# Patient Record
Sex: Male | Born: 1945 | Race: Black or African American | Hispanic: No | Marital: Married | Smoking: Never smoker
Health system: Southern US, Community
[De-identification: ages and names within clinical notes are randomized; demographics above are authoritative.]

## PROBLEM LIST (undated history)

## (undated) HISTORY — PX: APPENDECTOMY: SHX54

---

## 2015-04-05 DIAGNOSIS — E785 Hyperlipidemia, unspecified: Secondary | ICD-10-CM | POA: Insufficient documentation

## 2015-04-05 DIAGNOSIS — J309 Allergic rhinitis, unspecified: Secondary | ICD-10-CM

## 2015-04-05 DIAGNOSIS — J4520 Mild intermittent asthma, uncomplicated: Secondary | ICD-10-CM | POA: Insufficient documentation

## 2015-04-05 DIAGNOSIS — I152 Hypertension secondary to endocrine disorders: Secondary | ICD-10-CM

## 2015-04-05 DIAGNOSIS — E1169 Type 2 diabetes mellitus with other specified complication: Secondary | ICD-10-CM

## 2015-04-05 DIAGNOSIS — E118 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: Secondary | ICD-10-CM

## 2015-04-05 HISTORY — DX: Type 2 diabetes mellitus with other specified complication: E11.69

## 2015-04-05 HISTORY — DX: 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: E11.8

## 2015-04-05 HISTORY — DX: Allergic rhinitis, unspecified: J30.9

## 2015-04-05 HISTORY — DX: Type 2 diabetes mellitus with other circulatory complications: I15.2

## 2015-04-05 HISTORY — DX: Mild intermittent asthma, uncomplicated: J45.20

## 2016-12-26 DIAGNOSIS — N528 Other male erectile dysfunction: Secondary | ICD-10-CM

## 2016-12-26 HISTORY — DX: Other male erectile dysfunction: N52.8

## 2017-12-31 DIAGNOSIS — R768 Other specified abnormal immunological findings in serum: Secondary | ICD-10-CM

## 2017-12-31 HISTORY — DX: Other specified abnormal immunological findings in serum: R76.8

## 2019-11-02 DIAGNOSIS — F4321 Adjustment disorder with depressed mood: Secondary | ICD-10-CM

## 2019-11-02 HISTORY — DX: Adjustment disorder with depressed mood: F43.21

## 2020-03-22 DIAGNOSIS — Z0000 Encounter for general adult medical examination without abnormal findings: Secondary | ICD-10-CM | POA: Diagnosis not present

## 2020-03-22 DIAGNOSIS — J4520 Mild intermittent asthma, uncomplicated: Secondary | ICD-10-CM | POA: Diagnosis not present

## 2020-03-22 DIAGNOSIS — E7849 Other hyperlipidemia: Secondary | ICD-10-CM | POA: Diagnosis not present

## 2020-03-22 DIAGNOSIS — Z7689 Persons encountering health services in other specified circumstances: Secondary | ICD-10-CM | POA: Diagnosis not present

## 2020-03-22 DIAGNOSIS — I152 Hypertension secondary to endocrine disorders: Secondary | ICD-10-CM | POA: Diagnosis not present

## 2020-03-22 DIAGNOSIS — Z125 Encounter for screening for malignant neoplasm of prostate: Secondary | ICD-10-CM | POA: Diagnosis not present

## 2020-03-22 DIAGNOSIS — Z1321 Encounter for screening for nutritional disorder: Secondary | ICD-10-CM | POA: Diagnosis not present

## 2020-03-22 DIAGNOSIS — E785 Hyperlipidemia, unspecified: Secondary | ICD-10-CM | POA: Diagnosis not present

## 2020-03-22 DIAGNOSIS — E1169 Type 2 diabetes mellitus with other specified complication: Secondary | ICD-10-CM | POA: Diagnosis not present

## 2020-03-22 DIAGNOSIS — E1159 Type 2 diabetes mellitus with other circulatory complications: Secondary | ICD-10-CM | POA: Diagnosis not present

## 2020-03-22 DIAGNOSIS — D7589 Other specified diseases of blood and blood-forming organs: Secondary | ICD-10-CM | POA: Diagnosis not present

## 2020-03-22 DIAGNOSIS — D696 Thrombocytopenia, unspecified: Secondary | ICD-10-CM | POA: Diagnosis not present

## 2020-03-22 DIAGNOSIS — E119 Type 2 diabetes mellitus without complications: Secondary | ICD-10-CM | POA: Diagnosis not present

## 2020-03-22 DIAGNOSIS — Z5181 Encounter for therapeutic drug level monitoring: Secondary | ICD-10-CM | POA: Diagnosis not present

## 2020-03-26 DIAGNOSIS — E559 Vitamin D deficiency, unspecified: Secondary | ICD-10-CM

## 2020-03-26 DIAGNOSIS — E538 Deficiency of other specified B group vitamins: Secondary | ICD-10-CM

## 2020-03-26 DIAGNOSIS — R972 Elevated prostate specific antigen [PSA]: Secondary | ICD-10-CM | POA: Insufficient documentation

## 2020-03-26 DIAGNOSIS — D696 Thrombocytopenia, unspecified: Secondary | ICD-10-CM

## 2020-03-26 HISTORY — DX: Vitamin D deficiency, unspecified: E55.9

## 2020-03-26 HISTORY — DX: Thrombocytopenia, unspecified: D69.6

## 2020-03-26 HISTORY — DX: Deficiency of other specified B group vitamins: E53.8

## 2020-03-26 HISTORY — DX: Elevated prostate specific antigen (PSA): R97.20

## 2020-07-13 ENCOUNTER — Other Ambulatory Visit: Payer: Self-pay | Admitting: Urology

## 2020-07-13 ENCOUNTER — Other Ambulatory Visit (HOSPITAL_COMMUNITY): Payer: Self-pay | Admitting: Urology

## 2020-07-13 DIAGNOSIS — R972 Elevated prostate specific antigen [PSA]: Secondary | ICD-10-CM

## 2020-07-26 ENCOUNTER — Encounter (HOSPITAL_COMMUNITY)
Admission: RE | Admit: 2020-07-26 | Discharge: 2020-07-26 | Disposition: A | Discharge status: Home / Self Care | Payer: Medicare Other | Source: Ambulatory Visit | Attending: Urology | Admitting: Urology

## 2020-07-26 ENCOUNTER — Other Ambulatory Visit: Payer: Self-pay

## 2020-07-26 DIAGNOSIS — R972 Elevated prostate specific antigen [PSA]: Secondary | ICD-10-CM | POA: Insufficient documentation

## 2020-07-26 MED ORDER — TECHNETIUM TC 99M MEDRONATE IV KIT
22.0000 | PACK | Freq: Once | INTRAVENOUS | Status: AC | PRN
  Administered 2020-07-26: 22 via INTRAVENOUS

## 2020-07-27 ENCOUNTER — Encounter (HOSPITAL_COMMUNITY): Payer: Self-pay

## 2020-07-27 ENCOUNTER — Other Ambulatory Visit (HOSPITAL_COMMUNITY): Payer: Self-pay

## 2020-07-27 DIAGNOSIS — C61 Malignant neoplasm of prostate: Secondary | ICD-10-CM | POA: Insufficient documentation

## 2020-07-27 HISTORY — DX: Malignant neoplasm of prostate: C61

## 2020-08-11 ENCOUNTER — Telehealth: Payer: Self-pay | Admitting: Oncology

## 2020-08-11 NOTE — Telephone Encounter (Signed)
Scheduled appt per 6/10 referral. Called pt, no answer. Left msg with appt date and time.

## 2020-08-16 ENCOUNTER — Telehealth: Payer: Self-pay | Admitting: Oncology

## 2020-08-16 ENCOUNTER — Telehealth: Payer: Self-pay | Admitting: *Deleted

## 2020-08-16 ENCOUNTER — Inpatient Hospital Stay: Payer: Medicare Other | Attending: Oncology | Admitting: Oncology

## 2020-08-16 DIAGNOSIS — C7951 Secondary malignant neoplasm of bone: Secondary | ICD-10-CM | POA: Insufficient documentation

## 2020-08-16 DIAGNOSIS — Z79899 Other long term (current) drug therapy: Secondary | ICD-10-CM | POA: Insufficient documentation

## 2020-08-16 DIAGNOSIS — C61 Malignant neoplasm of prostate: Secondary | ICD-10-CM | POA: Insufficient documentation

## 2020-08-16 NOTE — Telephone Encounter (Signed)
Pt missed appt today. I called, no answer. Left msg for pt to call back to r/s.

## 2020-08-16 NOTE — Telephone Encounter (Signed)
[name] did not come to his 2pm appt with Dr. Alen Blew on 6/15. He did not call Dr. Hazeline Junker desk. Attempted to contact him -LVM asking him to contact CC to r/s. Messaged New patient scheduler with information that patient did not come to scheduled appt. She attempted to contact him, LVM to contact her to r/s.

## 2020-08-22 ENCOUNTER — Encounter: Payer: Self-pay | Admitting: *Deleted

## 2020-08-22 NOTE — Progress Notes (Signed)
Received this new patient referral from [facility]. They have had difficulty reaching patient. He was scheduled for a New Patient appointment with Dr Alen Blew, but was a no-show.   Made an attempt to contact patient which was unsuccessful. Voicemail left requesting call back.   Called twice more with no contact.  Called Judy Gentle at [facility] to notify her of issues with making contact with patient. She will attempt to make contact with patient.   Oncology Nurse Navigator Documentation  Oncology Nurse Navigator Flowsheets 08/22/2020  Diagnosis Status Confirmed Diagnosis Complete  Referral Date to RadOnc/MedOnc 08/22/2020  Navigator Encounter Type Introductory Phone Call  Patient Visit Type MedOnc  Treatment Phase Active Tx  Barriers/Navigation Needs Coordination of Care;Education  Interventions Coordination of Care  Time Spent with Patient 14

## 2020-08-23 ENCOUNTER — Encounter: Payer: Self-pay | Admitting: *Deleted

## 2020-08-23 NOTE — Progress Notes (Signed)
Patient was contacted by [facility] and received our message. He called this office and states that his phone doesn't notify him of calls made from numbers outside his contacts to limit spam calls. He states he will add our number.   Reached out to Dayton Scrape to introduce myself as the office RN Navigator and explain our new patient process. Reviewed the reason for their referral and scheduled their new patient appointment along with labs. Provided address and directions to the office including call back phone number. Reviewed with patient any concerns they may have or any possible barriers to attending their appointment.   Informed patient about my role as a navigator and that I will meet with them prior to their New Patient appointment and more fully discuss what services I can provide. At this time patient has no further questions or needs.    Oncology Nurse Navigator Documentation  Oncology Nurse Navigator Flowsheets 08/23/2020  Diagnosis Status -  Navigator Follow Up Date: 08/30/2020  Navigator Follow Up Reason: New Patient Appointment  Navigator Location [facility]  Referral Date to RadOnc/MedOnc -  Navigator Encounter Type Introductory Phone Call  Patient Visit Type MedOnc  Treatment Phase Active Tx  Barriers/Navigation Needs Coordination of Care;Education  Education Other  Interventions Coordination of Care;Education  Acuity Level 2-Minimal Needs (1-2 Barriers Identified)  Coordination of Care Appts  Education Method Verbal  Support Groups/Services Friends and Family  Time Spent with Patient 24

## 2020-08-30 ENCOUNTER — Encounter: Payer: Self-pay | Admitting: *Deleted

## 2020-08-30 ENCOUNTER — Other Ambulatory Visit: Payer: Self-pay

## 2020-08-30 ENCOUNTER — Other Ambulatory Visit (HOSPITAL_COMMUNITY): Payer: Self-pay

## 2020-08-30 ENCOUNTER — Inpatient Hospital Stay: Payer: Medicare Other

## 2020-08-30 ENCOUNTER — Inpatient Hospital Stay: Payer: Medicare Other | Admitting: Hematology & Oncology

## 2020-08-30 ENCOUNTER — Encounter: Payer: Self-pay | Admitting: Hematology & Oncology

## 2020-08-30 VITALS — BP 142/78 | HR 50 | Temp 98.6°F | Resp 20 | Wt 226.0 lb

## 2020-08-30 DIAGNOSIS — Z7189 Other specified counseling: Secondary | ICD-10-CM | POA: Diagnosis not present

## 2020-08-30 DIAGNOSIS — C61 Malignant neoplasm of prostate: Secondary | ICD-10-CM | POA: Diagnosis not present

## 2020-08-30 DIAGNOSIS — Z79899 Other long term (current) drug therapy: Secondary | ICD-10-CM | POA: Diagnosis not present

## 2020-08-30 DIAGNOSIS — C7800 Secondary malignant neoplasm of unspecified lung: Secondary | ICD-10-CM | POA: Insufficient documentation

## 2020-08-30 DIAGNOSIS — C7951 Secondary malignant neoplasm of bone: Secondary | ICD-10-CM

## 2020-08-30 HISTORY — DX: Other specified counseling: Z71.89

## 2020-08-30 HISTORY — DX: Secondary malignant neoplasm of unspecified lung: C78.00

## 2020-08-30 HISTORY — DX: Malignant neoplasm of prostate: C61

## 2020-08-30 HISTORY — DX: Secondary malignant neoplasm of bone: C79.51

## 2020-08-30 LAB — CBC WITH DIFFERENTIAL (CANCER CENTER ONLY)
Abs Immature Granulocytes: 0.02 10*3/uL (ref 0.00–0.07)
Basophils Absolute: 0 10*3/uL (ref 0.0–0.1)
Basophils Relative: 1 %
Eosinophils Absolute: 0.1 10*3/uL (ref 0.0–0.5)
Eosinophils Relative: 1 %
HCT: 44.4 % (ref 39.0–52.0)
Hemoglobin: 15 g/dL (ref 13.0–17.0)
Immature Granulocytes: 0 %
Lymphocytes Relative: 46 %
Lymphs Abs: 3 10*3/uL (ref 0.7–4.0)
MCH: 33.2 pg (ref 26.0–34.0)
MCHC: 33.8 g/dL (ref 30.0–36.0)
MCV: 98.2 fL (ref 80.0–100.0)
Monocytes Absolute: 0.6 10*3/uL (ref 0.1–1.0)
Monocytes Relative: 9 %
Neutro Abs: 2.8 10*3/uL (ref 1.7–7.7)
Neutrophils Relative %: 43 %
Platelet Count: 163 10*3/uL (ref 150–400)
RBC: 4.52 MIL/uL (ref 4.22–5.81)
RDW: 12.2 % (ref 11.5–15.5)
WBC Count: 6.5 10*3/uL (ref 4.0–10.5)
nRBC: 0 % (ref 0.0–0.2)

## 2020-08-30 LAB — CMP (CANCER CENTER ONLY)
ALT: 22 U/L (ref 0–44)
AST: 20 U/L (ref 15–41)
Albumin: 4.5 g/dL (ref 3.5–5.0)
Alkaline Phosphatase: 172 U/L — ABNORMAL HIGH (ref 38–126)
Anion gap: 10 (ref 5–15)
BUN: 22 mg/dL (ref 8–23)
CO2: 27 mmol/L (ref 22–32)
Calcium: 10.2 mg/dL (ref 8.9–10.3)
Chloride: 102 mmol/L (ref 98–111)
Creatinine: 1.11 mg/dL (ref 0.61–1.24)
GFR, Estimated: >60 mL/min (ref >60)
Glucose, Bld: 124 mg/dL — ABNORMAL HIGH (ref 70–99)
Potassium: 3.9 mmol/L (ref 3.5–5.1)
Sodium: 139 mmol/L (ref 135–145)
Total Bilirubin: 0.7 mg/dL (ref 0.3–1.2)
Total Protein: 8 g/dL (ref 6.5–8.1)

## 2020-08-30 LAB — LACTATE DEHYDROGENASE: LDH: 154 U/L (ref 98–192)

## 2020-08-30 MED ORDER — ABIRATERONE ACETATE 250 MG PO TABS
1000.0000 mg | ORAL_TABLET | Freq: Every day | ORAL | 6 refills | Status: DC
  Filled 2020-08-30: qty 120, 30d supply, fill #0

## 2020-08-30 NOTE — Progress Notes (Signed)
Initial RN Navigator Patient Visit  Name: Randall Carter Date of Referral : 08/22/2020 Diagnosis: Metastatic Prostate Cancer  Met with patient prior to their visit with MD. Hanley Seamen patient "Your Patient Navigator" handout which explains my role, areas in which I am able to help, and all the contact information for myself and the office. Also gave patient MD and Navigator business card. Reviewed with patient the general overview of expected course after initial diagnosis and time frame for all steps to be completed.  New patient packet given to patient which includes: orientation to office and staff; campus directory; education on My Chart and Advance Directives; and patient centered education on prostate cancer.   Patient lives by himself, but has a fiance and supportive children. He is retired.   Patient completed visit with Dr. Marin Olp. Once Dr Marin Olp completes his notes, treatment plan and orders I will follow up to initiate scheduling.   Patient understands all follow up procedures and expectations. They have my number to reach out for any further clarification or additional needs. Will call patient in 5-7 days to see if any further needs have presented, or if patient has any further questions or needs.    Oncology Nurse Navigator Documentation  Oncology Nurse Navigator Flowsheets 08/30/2020  Diagnosis Status -  Navigator Follow Up Date: 09/01/2020  Navigator Follow Up Reason: Appointment Review  Navigator Location [facility]  Referral Date to RadOnc/MedOnc -  Navigator Encounter Type Initial MedOnc  Patient Visit Type MedOnc  Treatment Phase Active Tx  Barriers/Navigation Needs Coordination of Care;Education  Education Newly Diagnosed Cancer Education;Pain/ Symptom Management;Other  Interventions Education;Psycho-Social Support  Acuity Level 2-Minimal Needs (1-2 Barriers Identified)  Coordination of Care -  Education Method Verbal;Written  Support Groups/Services Friends and  Family  Time Spent with Patient 40

## 2020-08-30 NOTE — Progress Notes (Signed)
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  HPI: [name] is a very nice 75 year old African-American male.  He has been in good health.  He is to work for a [employer].  He is now retired.  He does have children and grandchildren.  He actually has a fianc.  He has been followed closely by his family doctor.  However, recently he was found to have a markedly elevated PSA.  I think the PSA was 80.  He was referred to [facility].  He saw Dr. Arnette Schaumann.  She did do a prostate biopsy on him.  This did show that he had Gleason 4+5, 4+4 and 4+3 disease.  He had scans done.  He had a CT scanning done which did show pulmonary nodules.  He had a bone scan which showed lesions in his bones.  He had right iliac crest, L1 vertebral body.  Left T11 vertebral body.  ninth right rib, left fourth, fifth, sixth and seventh rib,  left scapula and right third rib.    He had a PSA of 86.[name]  He was given a dose of Firmagon.  This has caused some hot flashes.  I am sure that his testosterone level is quite low.  According to Dr. Keane Scrape note, she is going start him on Zytiga.  He is not on any oral therapy.  He is not hurting.  He has had no problems with bowels or bladder.  He has had no bleeding.  He has had no diarrhea..  There is been no cough or shortness of breath.  Is no history of prostate cancer in the family.  He says a sister has a breast cancer.  But she had breast cancer when she was a teenager.  I am unsure exactly what kind of breast cancer this was.  He does not smoke.  He does not drink.  Overall, I would say his performance status is ECOG 1.     History reviewed. No pertinent past medical history.:  History reviewed. No pertinent surgical  history.:   Current Outpatient Medications:    Accu-Chek FastClix Lancets MISC, Use one needle daily, Disp: , Rfl:    aspirin 81 MG EC tablet, Take by mouth., Disp: , Rfl:    atenolol (TENORMIN) 50 MG tablet, Take 50 mg by mouth daily., Disp: , Rfl:    Cholecalciferol 125 MCG (5000 UT) TABS, Take by mouth., Disp: , Rfl:    Multiple Vitamin (MULTI-VITAMIN) tablet, Take 1 tablet by mouth daily., Disp: , Rfl:    NIFEdipine (ADALAT CC) 30 MG 24 hr tablet, Take 30 mg by mouth daily., Disp: , Rfl:    simvastatin (ZOCOR) 40 MG tablet, Take 40 mg by mouth at bedtime., Disp: , Rfl:    vitamin B-12 (CYANOCOBALAMIN) 500 MCG tablet, Take by mouth., Disp: , Rfl:    albuterol (VENTOLIN HFA) 108 (90 Base) MCG/ACT inhaler, Inhale 2 puffs into the lungs every 6 (six) hours as needed. (Patient not taking: Reported on 08/30/2020), Disp: , Rfl:    glucose blood test strip, Test glucose one time per day (Patient not taking: Reported on 08/30/2020), Disp: , Rfl: :  :  Not on File:  History reviewed. No pertinent family history.:   Social History  Socioeconomic History   Marital status: Widowed    Spouse name: Not on file   Number of children: Not on file   Years of education: Not on file   Highest education level: Not on file  Occupational History   Not on file  Tobacco Use   Smoking status: Never    Passive exposure: Never   Smokeless tobacco: Never  Vaping Use   Vaping Use: Never used  Substance and Sexual Activity   Alcohol use: Not Currently   Drug use: Not Currently   Sexual activity: Not on file  Other Topics Concern   Not on file  Social History Narrative   Not on file   Social Determinants of Health   Financial Resource Strain: Not on file  Food Insecurity: Not on file  Transportation Needs: Not on file  Physical Activity: Not on file  Stress: Not on file  Social Connections: Not on file  Intimate Partner Violence: Not on file  :  Review of Systems  Constitutional:  Negative.   HENT: Negative.    Eyes: Negative.   Respiratory: Negative.    Cardiovascular: Negative.   Gastrointestinal: Negative.   Genitourinary: Negative.   Musculoskeletal: Negative.   Skin: Negative.   Neurological: Negative.   Endo/Heme/Allergies: Negative.   Psychiatric/Behavioral: Negative.      Exam:  This is a well-developed and well-nourished African-American male in no obvious distress.  Vital signs show temperature of 98.6.  Pulse 50.  Blood pressure 142/78.  Weight is 226 pounds.  Head and neck exam shows no ocular or oral lesions.  He has no scleral icterus.  He has no adenopathy in the neck.  Thyroid is not palpable.  Lungs are clear bilaterally.  Cardiac exam regular rate and rhythm with no murmurs, rubs or bruits.  Abdomen is soft.  He has good bowel sounds.  There is no fluid wave.  There is no guarding or rebound tenderness.  He has no palpable liver or spleen tip.  Back exam shows no tenderness over the spine, ribs or hips.  Extremities shows no clubbing, cyanosis or edema.  He has good range of motion of his joints.  He has good strength in his upper and lower extremities.  Neurological exam shows no focal neurological deficits.  Skin exam shows no rashes, ecchymoses or petechia. '@IPVITALS' @    Recent Labs    08/30/20 1019  WBC 6.5  HGB 15.0  HCT 44.4  PLT 163    Recent Labs    08/30/20 1019  NA 139  K 3.9  CL 102  CO2 27  GLUCOSE 124*  BUN 22  CREATININE 1.11  CALCIUM 10.2    Blood smear review: None  Pathology: See above    Assessment and Plan: Randall Carter is a very nice 75 year old African-American male.  He has metastatic prostate cancer.  He is castrate sensitive right now.  We will have to see if we can get the tumor specimens and send off for molecular markers, particularly, MSI and MMR.  Also need to send off for homologous recombination deficiency.  We will see about also doing a more specific scan for his prostate cancer.  I prefer the  new PSMA scan.  This can certainly give [place] a good idea as to the extent of his disease.  I really think that we can just get away with antiandrogen therapy on him.  I do not think we really have to start him on chemotherapy along with antiandrogen therapy.  I really do not think that he has a high volume of disease.  I will go ahead and add the Zytiga.  I think this would make sense.  I had a long talk with Mr. Leighton.  He is very nice.  He was nice to talk to.  He is very interesting.  I told him that this is a disease that is certainly treatable.  Is not curable.  However, we should be able to get a long-lasting response with antiandrogen therapy.  Answered all of his questions.  He lives in [place].  We will have to see if we cannot do our scans at [facility].  This would be easier for him.  I forgot to mention that he will need a bone hardener.  We will see about Delton See for him.  We will see about doing his antiandrogen therapy in the office.  Again he only lives about 5 minutes from our office.  He would prefer to have all of his treatments here.  I will plan to see him back in another month or so.  I would like to think that his PSA should come down quite nicely.

## 2020-08-30 NOTE — Progress Notes (Signed)
START ON PATHWAY REGIMEN - Prostate     A cycle is every 28 days:     Abiraterone acetate (conventional)      Prednisone   **Always confirm dose/schedule in your pharmacy ordering system**  Patient Characteristics: Adenocarcinoma, Recurrent/New Systemic Disease, Non-Castrate, M1, Low Volume Disease Histology: Adenocarcinoma Therapeutic Status: Recurrent/New Systemic Disease Intent of Therapy: Non-Curative / Palliative Intent, Discussed with Patient

## 2020-08-31 ENCOUNTER — Telehealth: Payer: Self-pay | Admitting: Pharmacy Technician

## 2020-08-31 ENCOUNTER — Encounter: Payer: Self-pay | Admitting: *Deleted

## 2020-08-31 ENCOUNTER — Telehealth: Payer: Self-pay

## 2020-08-31 ENCOUNTER — Encounter: Payer: Self-pay | Admitting: Hematology & Oncology

## 2020-08-31 ENCOUNTER — Other Ambulatory Visit (HOSPITAL_COMMUNITY): Payer: Self-pay

## 2020-08-31 ENCOUNTER — Telehealth: Payer: Self-pay | Admitting: Pharmacist

## 2020-08-31 ENCOUNTER — Telehealth: Payer: Self-pay | Admitting: *Deleted

## 2020-08-31 DIAGNOSIS — C61 Malignant neoplasm of prostate: Secondary | ICD-10-CM

## 2020-08-31 DIAGNOSIS — C7951 Secondary malignant neoplasm of bone: Secondary | ICD-10-CM

## 2020-08-31 LAB — PSA, TOTAL AND FREE
PSA, Free Pct: 8 %
PSA, Free: 1.14 ng/mL
Prostate Specific Ag, Serum: 14.3 ng/mL — ABNORMAL HIGH (ref 0.0–4.0)

## 2020-08-31 LAB — TESTOSTERONE
Testosterone: 22 ng/dL — ABNORMAL LOW (ref 264–916)
Testosterone: 24 ng/dL — ABNORMAL LOW (ref 264–916)

## 2020-08-31 NOTE — Telephone Encounter (Signed)
-----   Message from [name], MD sent at 08/31/2020  6:51 AM EDT ----- Call - the PSA is down to 14!!!!  Laurey Arrow

## 2020-08-31 NOTE — Telephone Encounter (Signed)
Oral Oncology Pharmacist Encounter  Received new prescription for Zytiga (abiraterone) for the treatment of metastatic castrate sensitive prostate cancer in conjunction with prednisone and ADT, planned duration until disease progression or unacceptable drug toxicity.  CMP from 08/30/20 assessed, no relevant lab abnormalities. Prescription dose and frequency assessed.   Current medication list in Epic reviewed, no DDIs with abiraterone identified.  Evaluated chart and no patient barriers to medication adherence identified.   Prescription has been e-scribed to the [facility] for benefits analysis and approval.  Oral Oncology Clinic will continue to follow for insurance authorization, copayment issues, initial counseling and start date.   Darl Pikes, PharmD, BCPS, BCOP, CPP Hematology/Oncology Clinical Pharmacist Practitioner [facility] [phone]  08/31/2020 3:20 PM

## 2020-08-31 NOTE — Progress Notes (Signed)
Oncomap testing request sent on specimen [id number] DOS 07/18/2020 per Dr Antonieta Pert request.  [facility] and requested homologous recombination deficiency testing be added to specimen [id number] DOS 07/18/2020 per Dr Antonieta Pert request. Damaris Schooner to [place] at [facility]. She will work with [employer] to have the additional testing request placed.   Oncology Nurse Navigator Documentation  Oncology Nurse Navigator Flowsheets 08/31/2020  Diagnosis Status -  Navigator Follow Up Date: 09/01/2020  Navigator Follow Up Reason: Appointment Review  Navigator Location [facility]  Referral Date to RadOnc/MedOnc -  Navigator Encounter Type Molecular Studies  Patient Visit Type MedOnc  Treatment Phase Active Tx  Barriers/Navigation Needs Coordination of Care;Education  Education -  Interventions Coordination of Care  Acuity Level 2-Minimal Needs (1-2 Barriers Identified)  Coordination of Care -  Education Method -  Support Groups/Services Friends and Family  Time Spent with Patient 8

## 2020-08-31 NOTE — Telephone Encounter (Signed)
As noted below by Dr. Marin Olp, I informed the patient that the PSA is down to 14.

## 2020-08-31 NOTE — Telephone Encounter (Signed)
Oral Oncology Patient Advocate Encounter  Prior Authorization for Fabio Asa has been approved.    PA# [id number] Effective dates: 08/31/20 through 03/03/21  Patients co-pay is $195.00 ($95 deductible & $100 copay)  Oral Oncology Clinic will continue to follow.   [place] Patient [place] Phone [phone] Fax [phone] 08/31/2020 12:06 PM

## 2020-08-31 NOTE — Telephone Encounter (Signed)
Oral Oncology Patient Advocate Encounter   Received notification from [employer] that prior authorization for Randall Carter is required.   PA submitted on CoverMyMeds Key [id number]  Status is pending   Oral Oncology Clinic will continue to follow.  [place] Patient [place] Phone [phone] Fax [phone] 08/31/2020 12:03 PM

## 2020-09-01 ENCOUNTER — Encounter: Payer: Self-pay | Admitting: *Deleted

## 2020-09-01 NOTE — Progress Notes (Signed)
Patient needs PET scan which is currently pending approval. Will schedule once PA obtained.  Patient is to start [place]. [facility] working on authorization/copay/dispensing.   Called and spoke to patient to update him to current progress. He had some questions about the Zytiga which I answered to his satisfaction. He also mentions that "someone named Bethena Roys has called me". I explained to him that she was part of the oral pharmacy team and he needed to call her back. He understood.   Received confirmation from [facility] that requested testing will be run on specimen.   Oncology Nurse Navigator Documentation  Oncology Nurse Navigator Flowsheets 09/01/2020  Diagnosis Status -  Navigator Follow Up Date: 09/06/2020  Navigator Follow Up Reason: Appointment Review  Navigator Location [facility]  Referral Date to RadOnc/MedOnc -  Navigator Encounter Type Telephone  Telephone Patient Update;Outgoing Call  Patient Visit Type MedOnc  Treatment Phase Active Tx  Barriers/Navigation Needs Coordination of Care;Education  Education Transport During Treatment;Other  Interventions Education;Psycho-Social Support  Acuity Level 2-Minimal Needs (1-2 Barriers Identified)  Coordination of Care -  Education Method Verbal  Support Groups/Services Friends and Family  Time Spent with Patient 35

## 2020-09-05 ENCOUNTER — Telehealth: Payer: Self-pay | Admitting: Pharmacy Technician

## 2020-09-05 NOTE — Telephone Encounter (Signed)
Oral Oncology Patient Advocate Encounter  Patient stopped by the [facility] this morning to complete application for Wynetta Emery and [place] in an effort to reduce patient's out of pocket expense for Zytiga to $0.    Application completed and faxed to [phone].   [employer] patient assistance phone number for follow up is [phone].   This encounter will be updated until final determination.  [place] Patient [place] Phone [phone] Fax [phone] 09/05/2020 3:01 PM

## 2020-09-07 MED ORDER — PREDNISONE 5 MG PO TABS
5.0000 mg | ORAL_TABLET | Freq: Every day | ORAL | 6 refills | Status: DC
Start: 2020-09-07 — End: 2021-03-28

## 2020-09-07 NOTE — Telephone Encounter (Signed)
Oral Chemotherapy Pharmacist Encounter  Patient Education I spoke with patient for overview of new oral chemotherapy medication: Zytiga (abiraterone) for the treatment of metastatic castrate sensitive prostate cancer in conjunction with prednisone and ADT, planned duration until disease progression or unacceptable drug toxicity.   Counseled patient on administration, dosing, side effects, monitoring, drug-food interactions, safe handling, storage, and disposal. Patient will take: -Zytiga (abiraterone): Take 4 tablets (1,000 mg total) by mouth daily. Take on an empty stomach 1 hour before or 2 hours after a meal -Prednisone: Take 1 tablet (5 mg total) by mouth daily with breakfast.  Side effects include but not limited to: HTN, edema, fatigue.    Reviewed with patient importance of keeping a medication schedule and plan for any missed doses.  After discussion with patient no patient barriers to medication adherence identified.   Mr. Bolin voiced understanding and appreciation. All questions answered. Medication handout provided.  Provided patient with [facility] phone number. Patient knows to call the office with questions or concerns. Oral Chemotherapy Navigation Clinic will continue to follow.  Darl Pikes, PharmD, BCPS, BCOP, CPP Hematology/Oncology Clinical Pharmacist Practitioner [facility] [place] [facility] [phone]   09/07/2020 2:46 PM

## 2020-09-07 NOTE — Telephone Encounter (Signed)
Oral Oncology Patient Advocate Encounter  Received notification from [place] and [place] ([employer]) that patient has been temporarily enrolled into their program to receive Zytiga from the manufacturer at $0 out of pocket until 11/06/20.  Patient will have assistance extended to 03/03/21 pending Medicare D attestation form being signed.    I called and spoke with patient.  He knows we will have to re-apply.   Specialty Pharmacy that will dispense medication is [facility].  Patient knows to call the office with questions or concerns.   Oral Oncology Clinic will continue to follow.  [place] Patient [place] Phone [phone] Fax [phone] 09/07/2020 11:39 AM

## 2020-09-08 ENCOUNTER — Encounter: Payer: Self-pay | Admitting: *Deleted

## 2020-09-08 NOTE — Progress Notes (Signed)
Called to notify patient that we are still waiting on insurance approval to schedule his PET scan. Request was submitted Wednesday and status is currently pending.   Patient understands the delay in getting the PET scheduled. He knows we will reach out to schedule once authorization is obtained.   He has no other needs at this time.   Oncology Nurse Navigator Documentation  Oncology Nurse Navigator Flowsheets 09/08/2020  Diagnosis Status -  Navigator Follow Up Date: 09/11/2020  Navigator Follow Up Reason: Follow-up Appointment  Navigator Location [facility]  Referral Date to RadOnc/MedOnc -  Navigator Encounter Type Telephone  Telephone Outgoing Call  Patient Visit Type MedOnc  Treatment Phase Active Tx  Barriers/Navigation Needs Coordination of Care;Education  Education Other  Interventions Education;Psycho-Social Support  Acuity Level 2-Minimal Needs (1-2 Barriers Identified)  Coordination of Care -  Education Method Verbal  Support Groups/Services Friends and Family  Time Spent with Patient 15

## 2020-09-11 ENCOUNTER — Inpatient Hospital Stay: Payer: Medicare Other | Admitting: Hematology & Oncology

## 2020-09-11 ENCOUNTER — Other Ambulatory Visit: Payer: Self-pay

## 2020-09-11 ENCOUNTER — Encounter: Payer: Self-pay | Admitting: Hematology & Oncology

## 2020-09-11 ENCOUNTER — Inpatient Hospital Stay: Payer: Medicare Other | Attending: Oncology

## 2020-09-11 ENCOUNTER — Inpatient Hospital Stay: Payer: Medicare Other

## 2020-09-11 VITALS — BP 118/64 | HR 54 | Temp 98.6°F | Resp 20 | Wt 227.0 lb

## 2020-09-11 DIAGNOSIS — C61 Malignant neoplasm of prostate: Secondary | ICD-10-CM

## 2020-09-11 DIAGNOSIS — Z79899 Other long term (current) drug therapy: Secondary | ICD-10-CM | POA: Insufficient documentation

## 2020-09-11 DIAGNOSIS — C7951 Secondary malignant neoplasm of bone: Secondary | ICD-10-CM | POA: Diagnosis not present

## 2020-09-11 DIAGNOSIS — C7800 Secondary malignant neoplasm of unspecified lung: Secondary | ICD-10-CM

## 2020-09-11 LAB — CMP (CANCER CENTER ONLY)
ALT: 18 U/L (ref 0–44)
AST: 19 U/L (ref 15–41)
Albumin: 4.3 g/dL (ref 3.5–5.0)
Alkaline Phosphatase: 132 U/L — ABNORMAL HIGH (ref 38–126)
Anion gap: 10 (ref 5–15)
BUN: 18 mg/dL (ref 8–23)
CO2: 26 mmol/L (ref 22–32)
Calcium: 10.5 mg/dL — ABNORMAL HIGH (ref 8.9–10.3)
Chloride: 105 mmol/L (ref 98–111)
Creatinine: 1.27 mg/dL — ABNORMAL HIGH (ref 0.61–1.24)
GFR, Estimated: 60 mL/min — ABNORMAL LOW (ref >60)
Glucose, Bld: 124 mg/dL — ABNORMAL HIGH (ref 70–99)
Potassium: 4.1 mmol/L (ref 3.5–5.1)
Sodium: 141 mmol/L (ref 135–145)
Total Bilirubin: 0.6 mg/dL (ref 0.3–1.2)
Total Protein: 7.5 g/dL (ref 6.5–8.1)

## 2020-09-11 LAB — CBC WITH DIFFERENTIAL (CANCER CENTER ONLY)
Abs Immature Granulocytes: 0.01 10*3/uL (ref 0.00–0.07)
Basophils Absolute: 0 10*3/uL (ref 0.0–0.1)
Basophils Relative: 0 %
Eosinophils Absolute: 0.1 10*3/uL (ref 0.0–0.5)
Eosinophils Relative: 1 %
HCT: 42.3 % (ref 39.0–52.0)
Hemoglobin: 14.3 g/dL (ref 13.0–17.0)
Immature Granulocytes: 0 %
Lymphocytes Relative: 50 %
Lymphs Abs: 3.5 10*3/uL (ref 0.7–4.0)
MCH: 32.9 pg (ref 26.0–34.0)
MCHC: 33.8 g/dL (ref 30.0–36.0)
MCV: 97.5 fL (ref 80.0–100.0)
Monocytes Absolute: 0.8 10*3/uL (ref 0.1–1.0)
Monocytes Relative: 10 %
Neutro Abs: 2.8 10*3/uL (ref 1.7–7.7)
Neutrophils Relative %: 39 %
Platelet Count: DECREASED 10*3/uL (ref 150–400)
RBC: 4.34 MIL/uL (ref 4.22–5.81)
RDW: 12 % (ref 11.5–15.5)
WBC Count: 7.2 10*3/uL (ref 4.0–10.5)
nRBC: 0 % (ref 0.0–0.2)

## 2020-09-11 MED ORDER — DENOSUMAB 120 MG/1.7ML ~~LOC~~ SOLN
SUBCUTANEOUS | Status: AC
  Filled 2020-09-11: qty 1.7

## 2020-09-11 MED ORDER — LEUPROLIDE ACETATE 7.5 MG ~~LOC~~ KIT
7.5000 mg | PACK | Freq: Once | SUBCUTANEOUS | Status: AC
  Administered 2020-09-11: 7.5 mg via SUBCUTANEOUS
  Filled 2020-09-11: qty 7.5

## 2020-09-11 MED ORDER — DENOSUMAB 120 MG/1.7ML ~~LOC~~ SOLN
120.0000 mg | Freq: Once | SUBCUTANEOUS | Status: AC
  Administered 2020-09-11: 120 mg via SUBCUTANEOUS

## 2020-09-11 NOTE — Patient Instructions (Signed)
Denosumab injection What is this medication? DENOSUMAB (den oh sue mab) slows bone breakdown. Prolia is used to treat osteoporosis in women after menopause and in men, and in people who are taking corticosteroids for 6 months or more. Delton See is used to treat a high calcium level due to cancer and to prevent bone fractures and other bone problems caused by multiple myeloma or cancer bone metastases. Delton See is also used totreat giant cell tumor of the bone. This medicine may be used for other purposes; ask your health care provider orpharmacist if you have questions. COMMON BRAND NAME(S): Prolia, XGEVA What should I tell my care team before I take this medication? They need to know if you have any of these conditions: dental disease having surgery or tooth extraction infection kidney disease low levels of calcium or Vitamin D in the blood malnutrition on hemodialysis skin conditions or sensitivity thyroid or parathyroid disease an unusual reaction to denosumab, other medicines, foods, dyes, or preservatives pregnant or trying to get pregnant breast-feeding How should I use this medication? This medicine is for injection under the skin. It is given by a health careprofessional in a hospital or clinic setting. A special MedGuide will be given to you before each treatment. Be sure to readthis information carefully each time. For Prolia, talk to your pediatrician regarding the use of this medicine in children. Special care may be needed. For Delton See, talk to your pediatrician regarding the use of this medicine in children. While this drug may be prescribed for children as young as 13 years for selected conditions,precautions do apply. Overdosage: If you think you have taken too much of this medicine contact apoison control center or emergency room at once. NOTE: This medicine is only for you. Do not share this medicine with others. What if I miss a dose? It is important not to miss your dose. Call  your doctor or health careprofessional if you are unable to keep an appointment. What may interact with this medication? Do not take this medicine with any of the following medications: other medicines containing denosumab This medicine may also interact with the following medications: medicines that lower your chance of fighting infection steroid medicines like prednisone or cortisone This list may not describe all possible interactions. Give your health care provider a list of all the medicines, herbs, non-prescription drugs, or dietary supplements you use. Also tell them if you smoke, drink alcohol, or use illegaldrugs. Some items may interact with your medicine. What should I watch for while using this medication? Visit your doctor or health care professional for regular checks on your progress. Your doctor or health care professional may order blood tests andother tests to see how you are doing. Call your doctor or health care professional for advice if you get a fever, chills or sore throat, or other symptoms of a cold or flu. Do not treat yourself. This drug may decrease your body's ability to fight infection. Try toavoid being around people who are sick. You should make sure you get enough calcium and vitamin D while you are taking this medicine, unless your doctor tells you not to. Discuss the foods you eatand the vitamins you take with your health care professional. See your dentist regularly. Brush and floss your teeth as directed. Before youhave any dental work done, tell your dentist you are receiving this medicine. Do not become pregnant while taking this medicine or for 5 months after stopping it. Talk with your doctor or health care professional about your  birth control options while taking this medicine. Women should inform their doctor if they wish to become pregnant or think they might be pregnant. There is a potential for serious side effects to an unborn child. Talk to your health  careprofessional or pharmacist for more information. What side effects may I notice from receiving this medication? Side effects that you should report to your doctor or health care professionalas soon as possible: allergic reactions like skin rash, itching or hives, swelling of the face, lips, or tongue bone pain breathing problems dizziness jaw pain, especially after dental work redness, blistering, peeling of the skin signs and symptoms of infection like fever or chills; cough; sore throat; pain or trouble passing urine signs of low calcium like fast heartbeat, muscle cramps or muscle pain; pain, tingling, numbness in the hands or feet; seizures unusual bleeding or bruising unusually weak or tired Side effects that usually do not require medical attention (report to yourdoctor or health care professional if they continue or are bothersome): constipation diarrhea headache joint pain loss of appetite muscle pain runny nose tiredness upset stomach This list may not describe all possible side effects. Call your doctor for medical advice about side effects. You may report side effects to FDA at1-800-FDA-1088. Where should I keep my medication? This medicine is only given in a clinic, doctor's office, or other health caresetting and will not be stored at home. NOTE: This sheet is a summary. It may not cover all possible information. If you have questions about this medicine, talk to your doctor, pharmacist, orhealth care provider.  2022 Elsevier/Gold Standard (2017-06-27 16:10:44)  Leuprolide depot injection What is this medication? LEUPROLIDE (loo PROE lide) is a man-made protein that acts like a natural hormone in the body. It decreases testosterone in men and decreases estrogen in women. In men, this medicine is used to treat advanced prostate cancer. In women, some forms of this medicine may be used to treat endometriosis, uterinefibroids, or other male hormone-related  problems. This medicine may be used for other purposes; ask your health care provider orpharmacist if you have questions. COMMON BRAND NAME(S): Eligard, Fensolv, Lupron Depot, Lupron Depot-Ped, Viadur What should I tell my care team before I take this medication? They need to know if you have any of these conditions: diabetes heart disease or previous heart attack high blood pressure high cholesterol mental illness osteoporosis pain or difficulty passing urine seizures spinal cord metastasis stroke suicidal thoughts, plans, or attempt; a previous suicide attempt by you or a family member tobacco smoker unusual vaginal bleeding (women) an unusual or allergic reaction to leuprolide, benzyl alcohol, other medicines, foods, dyes, or preservatives pregnant or trying to get pregnant breast-feeding How should I use this medication? This medicine is for injection into a muscle or for injection under the skin. It is given by a health care professional in a hospital or clinic setting. The specific product will determine how it will be given to you. Make sure youunderstand which product you receive and how often you will receive it. Talk to your pediatrician regarding the use of this medicine in children.Special care may be needed. Overdosage: If you think you have taken too much of this medicine contact apoison control center or emergency room at once. NOTE: This medicine is only for you. Do not share this medicine with others. What if I miss a dose? It is important not to miss a dose. Call your doctor or health careprofessional if you are unable to keep an appointment. Depot  injections: Depot injections are given either once-monthly, every 12 weeks, every 16 weeks, or every 24 weeks depending on the product you are prescribed. The product you are prescribed will be based on if you are male orfemale, and your condition. Make sure you understand your product and dosing. What may interact with this  medication? Do not take this medicine with any of the following medications: chasteberry cisapride dronedarone pimozide thioridazine This medicine may also interact with the following medications: herbal or dietary supplements, like black cohosh or DHEA male hormones, like estrogens or progestins and birth control pills, patches, rings, or injections male hormones, like testosterone other medicines that prolong the QT interval (abnormal heart rhythm) This list may not describe all possible interactions. Give your health care provider a list of all the medicines, herbs, non-prescription drugs, or dietary supplements you use. Also tell them if you smoke, drink alcohol, or use illegaldrugs. Some items may interact with your medicine. What should I watch for while using this medication? Visit your doctor or health care professional for regular checks on your progress. During the first weeks of treatment, your symptoms may get worse, but then will improve as you continue your treatment. You may get hot flashes, increased bone pain, increased difficulty passing urine, or an aggravation of nerve symptoms. Discuss these effects with your doctor or health careprofessional, some of them may improve with continued use of this medicine. Male patients may experience a menstrual cycle or spotting during the first months of therapy with this medicine. If this continues, contact your doctor orhealth care professional. This medicine may increase blood sugar. Ask your healthcare provider if changesin diet or medicines are needed if you have diabetes. What side effects may I notice from receiving this medication? Side effects that you should report to your doctor or health care professionalas soon as possible: allergic reactions like skin rash, itching or hives, swelling of the face, lips, or tongue breathing problems chest pain depression or memory disorders pain in your legs or groin pain at site where  injected or implanted seizures severe headache signs and symptoms of high blood sugar such as being more thirsty or hungry or having to urinate more than normal. You may also feel very tired or have blurry vision swelling of the feet and legs suicidal thoughts or other mood changes visual changes vomiting Side effects that usually do not require medical attention (report to yourdoctor or health care professional if they continue or are bothersome): breast swelling or tenderness decrease in sex drive or performance diarrhea hot flashes loss of appetite muscle, joint, or bone pains nausea redness or irritation at site where injected or implanted skin problems or acne This list may not describe all possible side effects. Call your doctor for medical advice about side effects. You may report side effects to FDA at1-800-FDA-1088. Where should I keep my medication? This drug is given in a hospital or clinic and will not be stored at home. NOTE: This sheet is a summary. It may not cover all possible information. If you have questions about this medicine, talk to your doctor, pharmacist, orhealth care provider.  2022 Elsevier/Gold Standard (2019-01-20 10:35:13)

## 2020-09-11 NOTE — Telephone Encounter (Signed)
Medicare D attestation signed today at appointment.  Faxed to [facility] [phone]).  [facility] [place] Phone [phone] Fax [phone] 09/11/2020 10:20 AM

## 2020-09-12 ENCOUNTER — Telehealth: Payer: Self-pay

## 2020-09-12 ENCOUNTER — Encounter: Payer: Self-pay | Admitting: Hematology & Oncology

## 2020-09-12 ENCOUNTER — Encounter: Payer: Self-pay | Admitting: *Deleted

## 2020-09-12 ENCOUNTER — Telehealth: Payer: Self-pay | Admitting: *Deleted

## 2020-09-12 LAB — PSA, TOTAL AND FREE
PSA, Free Pct: 9.9 %
PSA, Free: 0.69 ng/mL
Prostate Specific Ag, Serum: 7 ng/mL — ABNORMAL HIGH (ref 0.0–4.0)

## 2020-09-12 LAB — TESTOSTERONE: Testosterone: 14 ng/dL — ABNORMAL LOW (ref 264–916)

## 2020-09-12 NOTE — Telephone Encounter (Addendum)
-----   Message from [name], MD sent at 09/12/2020  9:44 AM EDT ----- Called patient to let him know the PSA is down to 7!!!  Great job!!

## 2020-09-12 NOTE — Progress Notes (Signed)
Patient's PET scan denied after initial request and peer to peer. At this time Dr Marin Olp doesn't want to pursue other scans.   Called patient and notified him of his insurance decision.   Oncology Nurse Navigator Documentation  Oncology Nurse Navigator Flowsheets 09/12/2020  Diagnosis Status -  Navigator Follow Up Date: 10/09/2020  Navigator Follow Up Reason: Follow-up Appointment  Navigator Location [facility]  Referral Date to RadOnc/MedOnc -  Navigator Encounter Type Appt/Treatment Plan Review;Telephone  Telephone Outgoing Call  Patient Visit Type MedOnc  Treatment Phase Active Tx  Barriers/Navigation Needs Coordination of Care;Education  Education Other  Interventions Education;Psycho-Social Support  Acuity Level 2-Minimal Needs (1-2 Barriers Identified)  Coordination of Care -  Education Method Verbal  Support Groups/Services Friends and Family  Time Spent with Patient 15

## 2020-09-12 NOTE — Telephone Encounter (Signed)
No 09/11/20 LOS noted   Randall Carter

## 2020-09-12 NOTE — Progress Notes (Signed)
Hematology and Oncology Follow Up Visit  Randall Carter [id number] 10-28-1945 75 y.o. 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 daily-start on 09/14/2020 Xgeva 120 mg IM q. 3 months-next dose on 12/2020     Interim History:  [name] is back for his second office visit.  We first saw him back in late June.  At that time, he was diagnosed metastatic prostate cancer.  He was castrate sensitive.  He was placed on Firmagon.  His PSA has come down.  We checked his PSA with his first visit it was down to 14.  We have to get him on Zytiga.  Hopefully he will start this in a couple days.  He will get his Delton See today.  He is feeling okay.  He is having hot flashes.  This is not surprising.  He has had no issues with appetite.  He has had no obvious change in bowel or bladder habits.  He has had no cough.  There is no shortness of breath.  He has had no bleeding.  Currently, I would have to say performance status is ECOG 1.  Medications:  Current Outpatient Medications:    Accu-Chek FastClix Lancets MISC, Use one needle daily, Disp: , Rfl:    aspirin 81 MG EC tablet, Take by mouth., Disp: , Rfl:    atenolol (TENORMIN) 50 MG tablet, Take 50 mg by mouth daily., Disp: , Rfl:    Cholecalciferol 125 MCG (5000 UT) TABS, Take by mouth., Disp: , Rfl:    Multiple Vitamin (MULTI-VITAMIN) tablet, Take 1 tablet by mouth daily., Disp: , Rfl:    NIFEdipine (ADALAT CC) 30 MG 24 hr tablet, Take 30 mg by mouth daily., Disp: , Rfl:    simvastatin (ZOCOR) 40 MG tablet, Take 40 mg by mouth at bedtime., Disp: , Rfl:    vitamin B-12 (CYANOCOBALAMIN) 500 MCG tablet, Take by mouth., Disp: , Rfl:    abiraterone acetate (ZYTIGA) 250 MG tablet, Take 4 tablets (1,000 mg total) by mouth daily. Take on an empty stomach 1 hour before or 2 hours after a meal (Patient not taking: Reported on  09/11/2020), Disp: 120 tablet, Rfl: 6   albuterol (VENTOLIN HFA) 108 (90 Base) MCG/ACT inhaler, Inhale 2 puffs into the lungs every 6 (six) hours as needed. (Patient not taking: No sig reported), Disp: , Rfl:    glucose blood test strip, Test glucose one time per day (Patient not taking: No sig reported), Disp: , Rfl:    predniSONE (DELTASONE) 5 MG tablet, Take 1 tablet (5 mg total) by mouth daily with breakfast. (Patient not taking: Reported on 09/11/2020), Disp: 30 tablet, Rfl: 6  Allergies: Not on File  Past Medical History, Surgical history, Social history, and Family History were reviewed and updated.  Review of Systems: Review of Systems  Constitutional: Negative.   HENT:  Negative.    Eyes: Negative.   Respiratory: Negative.    Cardiovascular: Negative.   Gastrointestinal: Negative.   Endocrine: Positive for hot flashes.  Genitourinary: Negative.    Musculoskeletal: Negative.   Skin: Negative.   Neurological: Negative.   Hematological: Negative.   Psychiatric/Behavioral: Negative.     Physical Exam:  weight is 227 lb (103 kg). His oral temperature is 98.6 F (37 C). His blood pressure is 118/64 and his pulse is 54 (abnormal). His respiration is 20 and oxygen saturation is 99%.   Wt  Readings from Last 3 Encounters:  09/11/20 227 lb (103 kg)  08/30/20 226 lb (102.5 kg)    Physical Exam Vitals reviewed.  HENT:     Head: Normocephalic and atraumatic.  Eyes:     Pupils: Pupils are equal, round, and reactive to light.  Cardiovascular:     Rate and Rhythm: Normal rate and regular rhythm.     Heart sounds: Normal heart sounds.  Pulmonary:     Effort: Pulmonary effort is normal.     Breath sounds: Normal breath sounds.  Abdominal:     General: Bowel sounds are normal.     Palpations: Abdomen is soft.  Musculoskeletal:        General: No tenderness or deformity. Normal range of motion.     Cervical back: Normal range of motion.  Lymphadenopathy:     Cervical: No  cervical adenopathy.  Skin:    General: Skin is warm and dry.     Findings: No erythema or rash.  Neurological:     Mental Status: He is alert and oriented to person, place, and time.  Psychiatric:        Behavior: Behavior normal.        Thought Content: Thought content normal.        Judgment: Judgment normal.     Lab Results  Component Value Date   WBC 7.2 09/11/2020   HGB 14.3 09/11/2020   HCT 42.3 09/11/2020   MCV 97.5 09/11/2020   PLT  09/11/2020    PLATELET CLUMPS NOTED ON SMEAR, COUNT APPEARS DECREASED     Chemistry      Component Value Date/Time   NA 141 09/11/2020 0849   K 4.1 09/11/2020 0849   CL 105 09/11/2020 0849   CO2 26 09/11/2020 0849   BUN 18 09/11/2020 0849   CREATININE 1.27 (H) 09/11/2020 0849      Component Value Date/Time   CALCIUM 10.5 (H) 09/11/2020 0849   ALKPHOS 132 (H) 09/11/2020 0849   AST 19 09/11/2020 0849   ALT 18 09/11/2020 0849   BILITOT 0.6 09/11/2020 0849      Impression and Plan: [name] is a very nice 75 year old African-American male.  He has castrate sensitive prostate cancer.  I try to get him set up with the new PSMA specific PET scan.  We will have to see if this can be done locally at [facility].  I do think that this would really help [place] out.  His PSA today is now down to 7.  As such, antiandrogen therapy is working.  His testosterone level is down to 14.    He has yet to start the [place].  I think once he starts this he will really see the PSA go down.  I do feel bad about the hot flashes.  I told him we could certainly help with this if necessary.  We will plan for follow-up in another 6 weeks.  I am just happy that the PSA is coming down nicely.  Hopefully we will see a prolonged response.   [name], MD 7/12/202211:51 AM

## 2020-09-14 ENCOUNTER — Encounter: Payer: Self-pay | Admitting: Hematology & Oncology

## 2020-09-14 ENCOUNTER — Encounter: Payer: Self-pay | Admitting: *Deleted

## 2020-09-14 ENCOUNTER — Telehealth: Payer: Self-pay

## 2020-09-14 NOTE — Progress Notes (Signed)
Patient started his Zytiga today.   Oncology Nurse Navigator Documentation  Oncology Nurse Navigator Flowsheets 09/14/2020  Diagnosis Status -  Navigator Follow Up Date: 10/09/2020  Navigator Follow Up Reason: Follow-up Appointment  Navigator Location [facility]  Referral Date to RadOnc/MedOnc -  Navigator Encounter Type Telephone  Telephone Incoming Call;Patient Update  Patient Visit Type MedOnc  Treatment Phase Active Tx  Barriers/Navigation Needs Coordination of Care;Education  Education -  Interventions Psycho-Social Support  Acuity Level 2-Minimal Needs (1-2 Barriers Identified)  Coordination of Care -  Education Method Verbal  Support Groups/Services Friends and Family  Time Spent with Patient 15

## 2020-10-09 ENCOUNTER — Inpatient Hospital Stay: Payer: Medicare Other | Admitting: Hematology & Oncology

## 2020-10-09 ENCOUNTER — Other Ambulatory Visit: Payer: Self-pay

## 2020-10-09 ENCOUNTER — Inpatient Hospital Stay: Payer: Medicare Other | Attending: Oncology

## 2020-10-09 ENCOUNTER — Encounter: Payer: Self-pay | Admitting: Hematology & Oncology

## 2020-10-09 ENCOUNTER — Inpatient Hospital Stay: Payer: Medicare Other

## 2020-10-09 VITALS — BP 122/65 | HR 54 | Temp 98.4°F | Resp 18 | Wt 228.0 lb

## 2020-10-09 DIAGNOSIS — C7951 Secondary malignant neoplasm of bone: Secondary | ICD-10-CM | POA: Insufficient documentation

## 2020-10-09 DIAGNOSIS — D510 Vitamin B12 deficiency anemia due to intrinsic factor deficiency: Secondary | ICD-10-CM

## 2020-10-09 DIAGNOSIS — Z79899 Other long term (current) drug therapy: Secondary | ICD-10-CM | POA: Diagnosis not present

## 2020-10-09 DIAGNOSIS — E0821 Diabetes mellitus due to underlying condition with diabetic nephropathy: Secondary | ICD-10-CM

## 2020-10-09 DIAGNOSIS — C61 Malignant neoplasm of prostate: Secondary | ICD-10-CM

## 2020-10-09 DIAGNOSIS — C7800 Secondary malignant neoplasm of unspecified lung: Secondary | ICD-10-CM

## 2020-10-09 LAB — CBC WITH DIFFERENTIAL (CANCER CENTER ONLY)
Abs Immature Granulocytes: 0.02 10*3/uL (ref 0.00–0.07)
Basophils Absolute: 0 10*3/uL (ref 0.0–0.1)
Basophils Relative: 1 %
Eosinophils Absolute: 0.1 10*3/uL (ref 0.0–0.5)
Eosinophils Relative: 1 %
HCT: 39.9 % (ref 39.0–52.0)
Hemoglobin: 13.8 g/dL (ref 13.0–17.0)
Immature Granulocytes: 0 %
Lymphocytes Relative: 51 %
Lymphs Abs: 4.5 10*3/uL — ABNORMAL HIGH (ref 0.7–4.0)
MCH: 33.5 pg (ref 26.0–34.0)
MCHC: 34.6 g/dL (ref 30.0–36.0)
MCV: 96.8 fL (ref 80.0–100.0)
Monocytes Absolute: 0.8 10*3/uL (ref 0.1–1.0)
Monocytes Relative: 9 %
Neutro Abs: 3.4 10*3/uL (ref 1.7–7.7)
Neutrophils Relative %: 38 %
Platelet Count: 178 10*3/uL (ref 150–400)
RBC: 4.12 MIL/uL — ABNORMAL LOW (ref 4.22–5.81)
RDW: 12.7 % (ref 11.5–15.5)
WBC Count: 8.8 10*3/uL (ref 4.0–10.5)
nRBC: 0 % (ref 0.0–0.2)

## 2020-10-09 LAB — CMP (CANCER CENTER ONLY)
ALT: 19 U/L (ref 0–44)
AST: 18 U/L (ref 15–41)
Albumin: 4 g/dL (ref 3.5–5.0)
Alkaline Phosphatase: 95 U/L (ref 38–126)
Anion gap: 9 (ref 5–15)
BUN: 16 mg/dL (ref 8–23)
CO2: 24 mmol/L (ref 22–32)
Calcium: 9 mg/dL (ref 8.9–10.3)
Chloride: 106 mmol/L (ref 98–111)
Creatinine: 1.19 mg/dL (ref 0.61–1.24)
GFR, Estimated: >60 mL/min (ref >60)
Glucose, Bld: 128 mg/dL — ABNORMAL HIGH (ref 70–99)
Potassium: 3.9 mmol/L (ref 3.5–5.1)
Sodium: 139 mmol/L (ref 135–145)
Total Bilirubin: 0.8 mg/dL (ref 0.3–1.2)
Total Protein: 7.3 g/dL (ref 6.5–8.1)

## 2020-10-09 LAB — HEMOGLOBIN A1C
Hgb A1c MFr Bld: 6.7 % — ABNORMAL HIGH (ref 4.8–5.6)
Mean Plasma Glucose: 145.59 mg/dL

## 2020-10-09 LAB — VITAMIN B12: Vitamin B-12: 815 pg/mL (ref 180–914)

## 2020-10-09 LAB — LACTATE DEHYDROGENASE: LDH: 152 U/L (ref 98–192)

## 2020-10-09 LAB — VITAMIN D 25 HYDROXY (VIT D DEFICIENCY, FRACTURES): Vit D, 25-Hydroxy: 128.92 ng/mL — ABNORMAL HIGH (ref 30–100)

## 2020-10-09 MED ORDER — LEUPROLIDE ACETATE 7.5 MG ~~LOC~~ KIT
7.5000 mg | PACK | Freq: Once | SUBCUTANEOUS | Status: AC
  Administered 2020-10-09: 7.5 mg via SUBCUTANEOUS
  Filled 2020-10-09: qty 7.5

## 2020-10-09 MED ORDER — LEUPROLIDE ACETATE 7.5 MG ~~LOC~~ KIT
7.5000 mg | PACK | Freq: Once | SUBCUTANEOUS | Status: DC
  Filled 2020-10-09: qty 7.5

## 2020-10-09 NOTE — Progress Notes (Signed)
Hematology and Oncology Follow Up Visit  Randall Carter:[id number] 1945/05/02 75 y.o. 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 daily-start on 09/14/2020 Xgeva 120 mg IM q. 3 months-next dose on 12/2020     Interim History:  Randall Carter is back for his follow-up.  He finally started the [place].  Thankfully, he has had no problems with this.  He does not have any problems with diarrhea.  There is no rashes.  He has had no issues with nausea or vomiting.  His last PSA he is doing quite nicely.  The PSA has come down quite well.  His last PSA in July was 7.  He is not experience to any symptoms with respect to the testosterone deprivation.  There is been no cough or shortness of breath.  He denies any bony pain.  Has had no headache.  Overall, his performance status is ECOG 1.    Medications:  Current Outpatient Medications:    NIFEdipine (ADALAT CC) 30 MG 24 hr tablet, Take 1 tablet by mouth daily., Disp: , Rfl:    abiraterone acetate (ZYTIGA) 250 MG tablet, Take 4 tablets (1,000 mg total) by mouth daily. Take on an empty stomach 1 hour before or 2 hours after a meal (Patient not taking: Reported on 09/11/2020), Disp: 120 tablet, Rfl: 6   Accu-Chek FastClix Lancets MISC, Use one needle daily, Disp: , Rfl:    albuterol (VENTOLIN HFA) 108 (90 Base) MCG/ACT inhaler, Inhale 2 puffs into the lungs every 6 (six) hours as needed. (Patient not taking: No sig reported), Disp: , Rfl:    aspirin 81 MG EC tablet, Take by mouth., Disp: , Rfl:    atenolol (TENORMIN) 50 MG tablet, Take 50 mg by mouth daily., Disp: , Rfl:    Cholecalciferol 125 MCG (5000 UT) TABS, Take by mouth., Disp: , Rfl:    glucose blood test strip, Test glucose one time per day (Patient not taking: No sig reported), Disp: , Rfl:    Multiple Vitamin (MULTI-VITAMIN) tablet, Take 1 tablet by mouth daily., Disp:  , Rfl:    NIFEdipine (ADALAT CC) 30 MG 24 hr tablet, Take 30 mg by mouth daily., Disp: , Rfl:    predniSONE (DELTASONE) 5 MG tablet, Take 1 tablet (5 mg total) by mouth daily with breakfast. (Patient not taking: Reported on 09/11/2020), Disp: 30 tablet, Rfl: 6   simvastatin (ZOCOR) 40 MG tablet, Take 40 mg by mouth at bedtime., Disp: , Rfl:    vitamin B-12 (CYANOCOBALAMIN) 500 MCG tablet, Take by mouth., Disp: , Rfl:   Allergies: Not on File  Past Medical History, Surgical history, Social history, and Family History were reviewed and updated.  Review of Systems: Review of Systems  Constitutional: Negative.   HENT:  Negative.    Eyes: Negative.   Respiratory: Negative.    Cardiovascular: Negative.   Gastrointestinal: Negative.   Endocrine: Positive for hot flashes.  Genitourinary: Negative.    Musculoskeletal: Negative.   Skin: Negative.   Neurological: Negative.   Hematological: Negative.   Psychiatric/Behavioral: Negative.     Physical Exam:  weight is 228 lb (103.4 kg). His oral temperature is 98.4 F (36.9 C). His blood pressure is 122/65 and his pulse is 54 (abnormal). His respiration is 18 and oxygen saturation is 100%.   Wt Readings from Last 3 Encounters:  10/09/20 228 lb (103.4 kg)  09/11/20 227 lb (  103 kg)  08/30/20 226 lb (102.5 kg)    Physical Exam Vitals reviewed.  HENT:     Head: Normocephalic and atraumatic.  Eyes:     Pupils: Pupils are equal, round, and reactive to light.  Cardiovascular:     Rate and Rhythm: Normal rate and regular rhythm.     Heart sounds: Normal heart sounds.  Pulmonary:     Effort: Pulmonary effort is normal.     Breath sounds: Normal breath sounds.  Abdominal:     General: Bowel sounds are normal.     Palpations: Abdomen is soft.  Musculoskeletal:        General: No tenderness or deformity. Normal range of motion.     Cervical back: Normal range of motion.  Lymphadenopathy:     Cervical: No cervical adenopathy.  Skin:     General: Skin is warm and dry.     Findings: No erythema or rash.  Neurological:     Mental Status: He is alert and oriented to person, place, and time.  Psychiatric:        Behavior: Behavior normal.        Thought Content: Thought content normal.        Judgment: Judgment normal.     Lab Results  Component Value Date   WBC 8.8 10/09/2020   HGB 13.8 10/09/2020   HCT 39.9 10/09/2020   MCV 96.8 10/09/2020   PLT 178 10/09/2020     Chemistry      Component Value Date/Time   NA 139 10/09/2020 0826   K 3.9 10/09/2020 0826   CL 106 10/09/2020 0826   CO2 24 10/09/2020 0826   BUN 16 10/09/2020 0826   CREATININE 1.19 10/09/2020 0826      Component Value Date/Time   CALCIUM 9.0 10/09/2020 0826   ALKPHOS 95 10/09/2020 0826   AST 18 10/09/2020 0826   ALT 19 10/09/2020 0826   BILITOT 0.8 10/09/2020 0826      Impression and Plan: Randall Carter is a very nice 75 year old African-American male.  He has castrate sensitive prostate cancer.  Unfortunately, I have not been able to set him up with a new PSMA PET scan.  I am unsure as to what the problem with this has been.  Hopefully, the PSA will be down further.  I am just happy that he is tolerating the Zytiga well.  We will plan for another follow-up in a month.  I wanted to try to coordinate the Lupron and the Xgeva.  At some point, we will put him on every 59-monthLupron.   PVolanda Napoleon MD 8/8/20229:07 AM

## 2020-10-09 NOTE — Patient Instructions (Signed)
Leuprolide injection What is this medication? LEUPROLIDE (loo PROE lide) is a man-made hormone. It is used to treat the symptoms of prostate cancer. This medicine may also be used to treat childrenwith early onset of puberty. It may be used for other hormonal conditions. This medicine may be used for other purposes; ask your health care provider orpharmacist if you have questions. COMMON BRAND NAME(S): Lupron What should I tell my care team before I take this medication? They need to know if you have any of these conditions: diabetes heart disease or previous heart attack high blood pressure high cholesterol pain or difficulty passing urine spinal cord metastasis stroke tobacco smoker an unusual or allergic reaction to leuprolide, benzyl alcohol, other medicines, foods, dyes, or preservatives pregnant or trying to get pregnant breast-feeding How should I use this medication? This medicine is for injection under the skin or into a muscle. You will be taught how to prepare and give this medicine. Use exactly as directed. Take your medicine at regular intervals. Do not take your medicine more often thandirected. It is important that you put your used needles and syringes in a special sharps container. Do not put them in a trash can. If you do not have a sharpscontainer, call your pharmacist or healthcare provider to get one. A special MedGuide will be given to you by the pharmacist with eachprescription and refill. Be sure to read this information carefully each time. Talk to your pediatrician regarding the use of this medicine in children. While this medicine may be prescribed for children as young as 8 years for selectedconditions, precautions do apply. Overdosage: If you think you have taken too much of this medicine contact apoison control center or emergency room at once. NOTE: This medicine is only for you. Do not share this medicine with others. What if I miss a dose? If you miss a  dose, take it as soon as you can. If it is almost time for yournext dose, take only that dose. Do not take double or extra doses. What may interact with this medication? Do not take this medicine with any of the following medications: chasteberry cisapride dronedarone pimozide thioridazine This medicine may also interact with the following medications: herbal or dietary supplements, like black cohosh or DHEA male hormones, like estrogens or progestins and birth control pills, patches, rings, or injections male hormones, like testosterone other medicines that prolong the QT interval (abnormal heart rhythm) This list may not describe all possible interactions. Give your health care provider a list of all the medicines, herbs, non-prescription drugs, or dietary supplements you use. Also tell them if you smoke, drink alcohol, or use illegaldrugs. Some items may interact with your medicine. What should I watch for while using this medication? Visit your doctor or health care professional for regular checks on your progress. During the first week, your symptoms may get worse, but then will improve as you continue your treatment. You may get hot flashes, increased bone pain, increased difficulty passing urine, or an aggravation of nerve symptoms. Discuss these effects with your doctor or health care professional, some ofthem may improve with continued use of this medicine. Male patients may experience a menstrual cycle or spotting during the first 2 months of therapy with this medicine. If this continues, contact your doctor orhealth care professional. This medicine may increase blood sugar. Ask your healthcare provider if changesin diet or medicines are needed if you have diabetes. What side effects may I notice from receiving this medication? Side   effects that you should report to your doctor or health care professionalas soon as possible: allergic reactions like skin rash, itching or hives,  swelling of the face, lips, or tongue breathing problems chest pain depression or memory disorders pain in your legs or groin pain at site where injected severe headache signs and symptoms of high blood sugar such as being more thirsty or hungry or having to urinate more than normal. You may also feel very tired or have blurry vision swelling of the feet and legs visual changes vomiting Side effects that usually do not require medical attention (report to yourdoctor or health care professional if they continue or are bothersome): breast swelling or tenderness decrease in sex drive or performance diarrhea hot flashes loss of appetite muscle, joint, or bone pains nausea redness or irritation at site where injected skin problems or acne This list may not describe all possible side effects. Call your doctor for medical advice about side effects. You may report side effects to FDA at1-800-FDA-1088. Where should I keep my medication? Keep out of the reach of children. Store below 25 degrees C (77 degrees F). Do not freeze. Protect from light. Do not use if it is not clear or if there are particles present. Throw away anyunused medicine after the expiration date. NOTE: This sheet is a summary. It may not cover all possible information. If you have questions about this medicine, talk to your doctor, pharmacist, orhealth care provider.  2022 Elsevier/Gold Standard (2019-01-20 10:57:41)  

## 2020-10-10 ENCOUNTER — Telehealth: Payer: Self-pay | Admitting: *Deleted

## 2020-10-10 ENCOUNTER — Encounter: Payer: Self-pay | Admitting: *Deleted

## 2020-10-10 LAB — PSA, TOTAL AND FREE
PSA, Free Pct: 20 %
PSA, Free: 0.18 ng/mL
Prostate Specific Ag, Serum: 0.9 ng/mL (ref 0.0–4.0)

## 2020-10-10 LAB — TESTOSTERONE: Testosterone: <3 ng/dL — ABNORMAL LOW (ref 264–916)

## 2020-10-10 NOTE — Progress Notes (Signed)
Oncology Nurse Navigator Documentation  Oncology Nurse Navigator Flowsheets 10/10/2020  Diagnosis Status -  Navigator Follow Up Date: 11/13/2020  Navigator Follow Up Reason: Follow-up Appointment  Navigator Location [facility]  Referral Date to RadOnc/MedOnc -  Navigator Encounter Type Appt/Treatment Plan Review  Telephone -  Patient Visit Type MedOnc  Treatment Phase Active Tx  Barriers/Navigation Needs Coordination of Care;Education  Education -  Interventions None Required  Acuity Level 2-Minimal Needs (1-2 Barriers Identified)  Coordination of Care -  Education Method -  Support Groups/Services Friends and Family  Time Spent with Patient 15

## 2020-10-10 NOTE — Telephone Encounter (Signed)
As noted below by Dr. Marin Olp, I informed the patient that his PSA is now normal. He verbalized understanding.

## 2020-10-10 NOTE — Telephone Encounter (Signed)
-----   Message from [name], MD sent at 10/10/2020 10:58 AM EDT ----- Calll - the PSA is now normal!!!  Excellent job!!  Laurey Arrow

## 2020-10-19 ENCOUNTER — Telehealth: Payer: Self-pay

## 2020-11-10 ENCOUNTER — Encounter: Payer: Self-pay | Admitting: Hematology & Oncology

## 2020-11-10 ENCOUNTER — Inpatient Hospital Stay: Payer: Medicare Other | Admitting: Hematology & Oncology

## 2020-11-10 ENCOUNTER — Inpatient Hospital Stay: Payer: Medicare Other | Attending: Oncology

## 2020-11-10 ENCOUNTER — Other Ambulatory Visit: Payer: Self-pay

## 2020-11-10 ENCOUNTER — Inpatient Hospital Stay: Payer: Medicare Other

## 2020-11-10 ENCOUNTER — Encounter: Payer: Self-pay | Admitting: *Deleted

## 2020-11-10 VITALS — BP 128/71 | HR 55 | Temp 98.9°F | Resp 18 | Wt 233.0 lb

## 2020-11-10 DIAGNOSIS — C61 Malignant neoplasm of prostate: Secondary | ICD-10-CM | POA: Insufficient documentation

## 2020-11-10 DIAGNOSIS — C7951 Secondary malignant neoplasm of bone: Secondary | ICD-10-CM | POA: Diagnosis not present

## 2020-11-10 DIAGNOSIS — C7800 Secondary malignant neoplasm of unspecified lung: Secondary | ICD-10-CM

## 2020-11-10 DIAGNOSIS — R232 Flushing: Secondary | ICD-10-CM | POA: Insufficient documentation

## 2020-11-10 DIAGNOSIS — Z79899 Other long term (current) drug therapy: Secondary | ICD-10-CM | POA: Diagnosis not present

## 2020-11-10 DIAGNOSIS — Z5111 Encounter for antineoplastic chemotherapy: Secondary | ICD-10-CM | POA: Diagnosis not present

## 2020-11-10 LAB — CBC WITH DIFFERENTIAL (CANCER CENTER ONLY)
Abs Immature Granulocytes: 0.02 10*3/uL (ref 0.00–0.07)
Basophils Absolute: 0 10*3/uL (ref 0.0–0.1)
Basophils Relative: 0 %
Eosinophils Absolute: 0.1 10*3/uL (ref 0.0–0.5)
Eosinophils Relative: 2 %
HCT: 39.8 % (ref 39.0–52.0)
Hemoglobin: 13.4 g/dL (ref 13.0–17.0)
Immature Granulocytes: 0 %
Lymphocytes Relative: 48 %
Lymphs Abs: 4.4 10*3/uL — ABNORMAL HIGH (ref 0.7–4.0)
MCH: 33.6 pg (ref 26.0–34.0)
MCHC: 33.7 g/dL (ref 30.0–36.0)
MCV: 99.7 fL (ref 80.0–100.0)
Monocytes Absolute: 0.9 10*3/uL (ref 0.1–1.0)
Monocytes Relative: 10 %
Neutro Abs: 3.6 10*3/uL (ref 1.7–7.7)
Neutrophils Relative %: 40 %
Platelet Count: 177 10*3/uL (ref 150–400)
RBC: 3.99 MIL/uL — ABNORMAL LOW (ref 4.22–5.81)
RDW: 12.9 % (ref 11.5–15.5)
WBC Count: 9.1 10*3/uL (ref 4.0–10.5)
nRBC: 0 % (ref 0.0–0.2)

## 2020-11-10 LAB — CMP (CANCER CENTER ONLY)
ALT: 91 U/L — ABNORMAL HIGH (ref 0–44)
AST: 39 U/L (ref 15–41)
Albumin: 4.2 g/dL (ref 3.5–5.0)
Alkaline Phosphatase: 63 U/L (ref 38–126)
Anion gap: 7 (ref 5–15)
BUN: 21 mg/dL (ref 8–23)
CO2: 28 mmol/L (ref 22–32)
Calcium: 9.8 mg/dL (ref 8.9–10.3)
Chloride: 104 mmol/L (ref 98–111)
Creatinine: 1.14 mg/dL (ref 0.61–1.24)
GFR, Estimated: >60 mL/min (ref >60)
Glucose, Bld: 126 mg/dL — ABNORMAL HIGH (ref 70–99)
Potassium: 4.2 mmol/L (ref 3.5–5.1)
Sodium: 139 mmol/L (ref 135–145)
Total Bilirubin: 0.7 mg/dL (ref 0.3–1.2)
Total Protein: 7.4 g/dL (ref 6.5–8.1)

## 2020-11-10 MED ORDER — LEUPROLIDE ACETATE 7.5 MG ~~LOC~~ KIT
7.5000 mg | PACK | Freq: Once | SUBCUTANEOUS | Status: AC
  Administered 2020-11-10: 7.5 mg via SUBCUTANEOUS
  Filled 2020-11-10: qty 7.5

## 2020-11-10 NOTE — Progress Notes (Signed)
Hematology and Oncology Follow Up Visit  Randall Carter [id number] January 10, 1946 75 y.o. 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 daily-start on 09/14/2020 Xgeva 120 mg IM q. 3 months-next dose on 12/2020     Interim History:  Randall Carter is back for his follow-up.  He has done incredibly well with the Lupron and Zytiga.  His last PSA was 0.9.  He does have the hot flashes which I am not surprised with.  He is retired.  He is enjoying retirement.  He has had a pretty decent summer so far.  He is not complaining of any pain.  There is no change in bowel or bladder habits.  He has had no cough or shortness of breath.  There is been no issues with fever.  He has had no bleeding.  There is been no rashes.  He has had no headache.  Of note, his last testosterone level back in August was less than 3.    Overall, his performance status is ECOG 1.    Medications:  Current Outpatient Medications:    abiraterone acetate (ZYTIGA) 250 MG tablet, Take 4 tablets (1,000 mg total) by mouth daily. Take on an empty stomach 1 hour before or 2 hours after a meal (Patient not taking: Reported on 09/11/2020), Disp: 120 tablet, Rfl: 6   Accu-Chek FastClix Lancets MISC, Use one needle daily, Disp: , Rfl:    albuterol (VENTOLIN HFA) 108 (90 Base) MCG/ACT inhaler, Inhale 2 puffs into the lungs every 6 (six) hours as needed. (Patient not taking: No sig reported), Disp: , Rfl:    aspirin 81 MG EC tablet, Take by mouth., Disp: , Rfl:    atenolol (TENORMIN) 50 MG tablet, Take 50 mg by mouth daily., Disp: , Rfl:    Cholecalciferol 125 MCG (5000 UT) TABS, Take by mouth., Disp: , Rfl:    glucose blood test strip, Test glucose one time per day (Patient not taking: No sig reported), Disp: , Rfl:    Multiple Vitamin (MULTI-VITAMIN) tablet, Take 1 tablet by mouth daily., Disp: ,  Rfl:    NIFEdipine (ADALAT CC) 30 MG 24 hr tablet, Take 30 mg by mouth daily., Disp: , Rfl:    predniSONE (DELTASONE) 5 MG tablet, Take 1 tablet (5 mg total) by mouth daily with breakfast. (Patient not taking: Reported on 09/11/2020), Disp: 30 tablet, Rfl: 6   simvastatin (ZOCOR) 40 MG tablet, Take 40 mg by mouth at bedtime., Disp: , Rfl:    vitamin B-12 (CYANOCOBALAMIN) 500 MCG tablet, Take by mouth., Disp: , Rfl:  No current facility-administered medications for this visit.  Facility-Administered Medications Ordered in Other Visits:    Leuprolide Acetate (ELIGARD) 7.5 MG injection 7.5 mg, 7.5 mg, Subcutaneous, Once, Randall Carter, Randall Cobb, MD  Allergies: Not on File  Past Medical History, Surgical history, Social history, and Family History were reviewed and updated.  Review of Systems: Review of Systems  Constitutional: Negative.   HENT:  Negative.    Eyes: Negative.   Respiratory: Negative.    Cardiovascular: Negative.   Gastrointestinal: Negative.   Endocrine: Positive for hot flashes.  Genitourinary: Negative.    Musculoskeletal: Negative.   Skin: Negative.   Neurological: Negative.   Hematological: Negative.   Psychiatric/Behavioral: Negative.     Physical Exam:  weight is 233 lb (105.7 kg). His oral temperature is 98.9 F (37.2 C). His  blood pressure is 128/71 and his pulse is 55 (abnormal). His respiration is 18 and oxygen saturation is 99%.   Wt Readings from Last 3 Encounters:  11/10/20 233 lb (105.7 kg)  10/09/20 228 lb (103.4 kg)  09/11/20 227 lb (103 kg)    Physical Exam Vitals reviewed.  HENT:     Head: Normocephalic and atraumatic.  Eyes:     Pupils: Pupils are equal, round, and reactive to light.  Cardiovascular:     Rate and Rhythm: Normal rate and regular rhythm.     Heart sounds: Normal heart sounds.  Pulmonary:     Effort: Pulmonary effort is normal.     Breath sounds: Normal breath sounds.  Abdominal:     General: Bowel sounds are normal.      Palpations: Abdomen is soft.  Musculoskeletal:        General: No tenderness or deformity. Normal range of motion.     Cervical back: Normal range of motion.  Lymphadenopathy:     Cervical: No cervical adenopathy.  Skin:    General: Skin is warm and dry.     Findings: No erythema or rash.  Neurological:     Mental Status: He is alert and oriented to person, place, and time.  Psychiatric:        Behavior: Behavior normal.        Thought Content: Thought content normal.        Judgment: Judgment normal.     Lab Results  Component Value Date   WBC 9.1 11/10/2020   HGB 13.4 11/10/2020   HCT 39.8 11/10/2020   MCV 99.7 11/10/2020   PLT 177 11/10/2020     Chemistry      Component Value Date/Time   NA 139 11/10/2020 0914   K 4.2 11/10/2020 0914   CL 104 11/10/2020 0914   CO2 28 11/10/2020 0914   BUN 21 11/10/2020 0914   CREATININE 1.14 11/10/2020 0914      Component Value Date/Time   CALCIUM 9.8 11/10/2020 0914   ALKPHOS 63 11/10/2020 0914   AST 39 11/10/2020 0914   ALT 91 (H) 11/10/2020 0914   BILITOT 0.7 11/10/2020 0914      Impression and Plan: Randall Carter is a very nice 75 year old African-American male.  He has castrate sensitive prostate cancer.  Again, his response has been incredibly satisfactory with the Lupron and Zytiga.  I am just very happy about this.  At some point, we will get have to do a scan on him to see how the bones actually look.  We will now change his Lupron to every 9-monththerapy.  I feel better about doing every 355-monthupron now.  He will get XgDelton Seehen we see him back in October.   Randall NapoleonMD 9/9/202210:09 AM

## 2020-11-10 NOTE — Patient Instructions (Signed)
Leuprolide injection What is this medication? LEUPROLIDE (loo PROE lide) is a man-made hormone. It is used to treat the symptoms of prostate cancer. This medicine may also be used to treat childrenwith early onset of puberty. It may be used for other hormonal conditions. This medicine may be used for other purposes; ask your health care provider orpharmacist if you have questions. COMMON BRAND NAME(S): Lupron What should I tell my care team before I take this medication? They need to know if you have any of these conditions: diabetes heart disease or previous heart attack high blood pressure high cholesterol pain or difficulty passing urine spinal cord metastasis stroke tobacco smoker an unusual or allergic reaction to leuprolide, benzyl alcohol, other medicines, foods, dyes, or preservatives pregnant or trying to get pregnant breast-feeding How should I use this medication? This medicine is for injection under the skin or into a muscle. You will be taught how to prepare and give this medicine. Use exactly as directed. Take your medicine at regular intervals. Do not take your medicine more often thandirected. It is important that you put your used needles and syringes in a special sharps container. Do not put them in a trash can. If you do not have a sharpscontainer, call your pharmacist or healthcare provider to get one. A special MedGuide will be given to you by the pharmacist with eachprescription and refill. Be sure to read this information carefully each time. Talk to your pediatrician regarding the use of this medicine in children. While this medicine may be prescribed for children as young as 8 years for selectedconditions, precautions do apply. Overdosage: If you think you have taken too much of this medicine contact apoison control center or emergency room at once. NOTE: This medicine is only for you. Do not share this medicine with others. What if I miss a dose? If you miss a  dose, take it as soon as you can. If it is almost time for yournext dose, take only that dose. Do not take double or extra doses. What may interact with this medication? Do not take this medicine with any of the following medications: chasteberry cisapride dronedarone pimozide thioridazine This medicine may also interact with the following medications: herbal or dietary supplements, like black cohosh or DHEA male hormones, like estrogens or progestins and birth control pills, patches, rings, or injections male hormones, like testosterone other medicines that prolong the QT interval (abnormal heart rhythm) This list may not describe all possible interactions. Give your health care provider a list of all the medicines, herbs, non-prescription drugs, or dietary supplements you use. Also tell them if you smoke, drink alcohol, or use illegaldrugs. Some items may interact with your medicine. What should I watch for while using this medication? Visit your doctor or health care professional for regular checks on your progress. During the first week, your symptoms may get worse, but then will improve as you continue your treatment. You may get hot flashes, increased bone pain, increased difficulty passing urine, or an aggravation of nerve symptoms. Discuss these effects with your doctor or health care professional, some ofthem may improve with continued use of this medicine. Male patients may experience a menstrual cycle or spotting during the first 2 months of therapy with this medicine. If this continues, contact your doctor orhealth care professional. This medicine may increase blood sugar. Ask your healthcare provider if changesin diet or medicines are needed if you have diabetes. What side effects may I notice from receiving this medication? Side   effects that you should report to your doctor or health care professionalas soon as possible: allergic reactions like skin rash, itching or hives,  swelling of the face, lips, or tongue breathing problems chest pain depression or memory disorders pain in your legs or groin pain at site where injected severe headache signs and symptoms of high blood sugar such as being more thirsty or hungry or having to urinate more than normal. You may also feel very tired or have blurry vision swelling of the feet and legs visual changes vomiting Side effects that usually do not require medical attention (report to yourdoctor or health care professional if they continue or are bothersome): breast swelling or tenderness decrease in sex drive or performance diarrhea hot flashes loss of appetite muscle, joint, or bone pains nausea redness or irritation at site where injected skin problems or acne This list may not describe all possible side effects. Call your doctor for medical advice about side effects. You may report side effects to FDA at1-800-FDA-1088. Where should I keep my medication? Keep out of the reach of children. Store below 25 degrees C (77 degrees F). Do not freeze. Protect from light. Do not use if it is not clear or if there are particles present. Throw away anyunused medicine after the expiration date. NOTE: This sheet is a summary. It may not cover all possible information. If you have questions about this medicine, talk to your doctor, pharmacist, orhealth care provider.  2022 Elsevier/Gold Standard (2019-01-20 10:57:41)  

## 2020-11-10 NOTE — Progress Notes (Signed)
Oncology Nurse Navigator Documentation  Oncology Nurse Navigator Flowsheets 11/10/2020  Diagnosis Status -  Navigator Follow Up Date: -  Navigator Follow Up Reason: -  Navigation Complete Date: 11/10/2020  Post Navigation: Continue to Follow Patient? No  Reason Not Navigating Patient: Patient On Maintenance Chemotherapy  Navigator Location [facility]  Referral Date to RadOnc/MedOnc -  Navigator Encounter Type Appt/Treatment Plan Review  Telephone -  Patient Visit Type MedOnc  Treatment Phase Active Tx  Barriers/Navigation Needs No Barriers At This Time  Education -  Interventions None Required  Acuity Level 1-No Barriers  Coordination of Care -  Education Method -  Support Groups/Services Friends and Family  Time Spent with Patient 15

## 2020-11-11 LAB — TESTOSTERONE: Testosterone: <3 ng/dL — ABNORMAL LOW (ref 264–916)

## 2020-11-11 LAB — PSA, TOTAL AND FREE
PSA, Free Pct: 30 %
PSA, Free: 0.15 ng/mL
Prostate Specific Ag, Serum: 0.5 ng/mL (ref 0.0–4.0)

## 2020-11-13 ENCOUNTER — Other Ambulatory Visit: Payer: Medicare Other

## 2020-11-13 ENCOUNTER — Telehealth: Payer: Self-pay

## 2020-11-13 ENCOUNTER — Ambulatory Visit: Payer: Medicare Other

## 2020-11-13 ENCOUNTER — Ambulatory Visit: Payer: Medicare Other | Admitting: Hematology & Oncology

## 2020-11-22 ENCOUNTER — Other Ambulatory Visit (HOSPITAL_COMMUNITY): Payer: Self-pay

## 2020-11-22 ENCOUNTER — Telehealth: Payer: Self-pay | Admitting: Pharmacy Technician

## 2020-11-22 ENCOUNTER — Encounter: Payer: Self-pay | Admitting: Hematology & Oncology

## 2020-11-22 NOTE — Telephone Encounter (Signed)
Oral Oncology Patient Advocate Encounter   Was successful in securing patient a $3500 grant from Patient [place] (PAF) to provide copayment coverage for Zytiga.  This will keep the out of pocket expense at $0.     I have spoken with the patient.    The billing information is as follows and has been shared with Benedict.    RxBin: [id number] PCN:  [id number] Member ID: [id number] Group ID: [id number] Dates of Eligibility: 11/22/20 through 11/22/21  Fund: [place] Patient [place] Phone [phone] Fax [phone] 11/22/2020 1:59 PM

## 2020-12-11 ENCOUNTER — Inpatient Hospital Stay: Payer: Medicare Other | Admitting: Hematology & Oncology

## 2020-12-11 ENCOUNTER — Other Ambulatory Visit: Payer: Self-pay

## 2020-12-11 ENCOUNTER — Inpatient Hospital Stay: Payer: Medicare Other

## 2020-12-11 ENCOUNTER — Encounter: Payer: Self-pay | Admitting: Hematology & Oncology

## 2020-12-11 ENCOUNTER — Inpatient Hospital Stay: Payer: Medicare Other | Attending: Oncology

## 2020-12-11 VITALS — BP 128/56 | HR 61 | Temp 98.5°F | Resp 18 | Ht 70.0 in | Wt 235.8 lb

## 2020-12-11 DIAGNOSIS — C61 Malignant neoplasm of prostate: Secondary | ICD-10-CM | POA: Diagnosis not present

## 2020-12-11 DIAGNOSIS — C7951 Secondary malignant neoplasm of bone: Secondary | ICD-10-CM | POA: Insufficient documentation

## 2020-12-11 DIAGNOSIS — Z79899 Other long term (current) drug therapy: Secondary | ICD-10-CM | POA: Diagnosis not present

## 2020-12-11 LAB — CMP (CANCER CENTER ONLY)
ALT: 95 U/L — ABNORMAL HIGH (ref 0–44)
AST: 48 U/L — ABNORMAL HIGH (ref 15–41)
Albumin: 4.1 g/dL (ref 3.5–5.0)
Alkaline Phosphatase: 87 U/L (ref 38–126)
Anion gap: 7 (ref 5–15)
BUN: 16 mg/dL (ref 8–23)
CO2: 28 mmol/L (ref 22–32)
Calcium: 9.8 mg/dL (ref 8.9–10.3)
Chloride: 106 mmol/L (ref 98–111)
Creatinine: 1.1 mg/dL (ref 0.61–1.24)
GFR, Estimated: >60 mL/min (ref >60)
Glucose, Bld: 137 mg/dL — ABNORMAL HIGH (ref 70–99)
Potassium: 3.9 mmol/L (ref 3.5–5.1)
Sodium: 141 mmol/L (ref 135–145)
Total Bilirubin: 0.9 mg/dL (ref 0.3–1.2)
Total Protein: 7.1 g/dL (ref 6.5–8.1)

## 2020-12-11 LAB — CBC WITH DIFFERENTIAL (CANCER CENTER ONLY)
Abs Immature Granulocytes: 0.02 10*3/uL (ref 0.00–0.07)
Basophils Absolute: 0.1 10*3/uL (ref 0.0–0.1)
Basophils Relative: 1 %
Eosinophils Absolute: 0.2 10*3/uL (ref 0.0–0.5)
Eosinophils Relative: 3 %
HCT: 37.6 % — ABNORMAL LOW (ref 39.0–52.0)
Hemoglobin: 12.6 g/dL — ABNORMAL LOW (ref 13.0–17.0)
Immature Granulocytes: 0 %
Lymphocytes Relative: 37 %
Lymphs Abs: 2.9 10*3/uL (ref 0.7–4.0)
MCH: 33.9 pg (ref 26.0–34.0)
MCHC: 33.5 g/dL (ref 30.0–36.0)
MCV: 101.1 fL — ABNORMAL HIGH (ref 80.0–100.0)
Monocytes Absolute: 1 10*3/uL (ref 0.1–1.0)
Monocytes Relative: 13 %
Neutro Abs: 3.6 10*3/uL (ref 1.7–7.7)
Neutrophils Relative %: 46 %
Platelet Count: 195 10*3/uL (ref 150–400)
RBC: 3.72 MIL/uL — ABNORMAL LOW (ref 4.22–5.81)
RDW: 12.9 % (ref 11.5–15.5)
WBC Count: 7.8 10*3/uL (ref 4.0–10.5)
nRBC: 0 % (ref 0.0–0.2)

## 2020-12-11 MED ORDER — LEUPROLIDE ACETATE (3 MONTH) 22.5 MG IM KIT: 22.5000 mg | PACK | Freq: Once | INTRAMUSCULAR | Status: DC

## 2020-12-11 MED ORDER — LEUPROLIDE ACETATE (3 MONTH) 22.5 MG ~~LOC~~ KIT
22.5000 mg | PACK | Freq: Once | SUBCUTANEOUS | Status: AC
  Administered 2020-12-11: 22.5 mg via SUBCUTANEOUS
  Filled 2020-12-11: qty 22.5

## 2020-12-11 MED ORDER — DENOSUMAB 120 MG/1.7ML ~~LOC~~ SOLN
120.0000 mg | Freq: Once | SUBCUTANEOUS | Status: AC
  Administered 2020-12-11: 120 mg via SUBCUTANEOUS
  Filled 2020-12-11: qty 1.7

## 2020-12-11 NOTE — Patient Instructions (Addendum)
Denosumab injection What is this medication? DENOSUMAB (den oh sue mab) slows bone breakdown. Prolia is used to treat osteoporosis in women after menopause and in men, and in people who are taking corticosteroids for 6 months or more. Delton See is used to treat a high calcium level due to cancer and to prevent bone fractures and other bone problems caused by multiple myeloma or cancer bone metastases. Delton See is also used totreat giant cell tumor of the bone. This medicine may be used for other purposes; ask your health care provider orpharmacist if you have questions. COMMON BRAND NAME(S): Prolia, XGEVA What should I tell my care team before I take this medication? They need to know if you have any of these conditions: dental disease having surgery or tooth extraction infection kidney disease low levels of calcium or Vitamin D in the blood malnutrition on hemodialysis skin conditions or sensitivity thyroid or parathyroid disease an unusual reaction to denosumab, other medicines, foods, dyes, or preservatives pregnant or trying to get pregnant breast-feeding How should I use this medication? This medicine is for injection under the skin. It is given by a health careprofessional in a hospital or clinic setting. A special MedGuide will be given to you before each treatment. Be sure to readthis information carefully each time. For Prolia, talk to your pediatrician regarding the use of this medicine in children. Special care may be needed. For Delton See, talk to your pediatrician regarding the use of this medicine in children. While this drug may be prescribed for children as young as 13 years for selected conditions,precautions do apply. Overdosage: If you think you have taken too much of this medicine contact apoison control center or emergency room at once. NOTE: This medicine is only for you. Do not share this medicine with others. What if I miss a dose? It is important not to miss your dose. Call  your doctor or health careprofessional if you are unable to keep an appointment. What may interact with this medication? Do not take this medicine with any of the following medications: other medicines containing denosumab This medicine may also interact with the following medications: medicines that lower your chance of fighting infection steroid medicines like prednisone or cortisone This list may not describe all possible interactions. Give your health care provider a list of all the medicines, herbs, non-prescription drugs, or dietary supplements you use. Also tell them if you smoke, drink alcohol, or use illegaldrugs. Some items may interact with your medicine. What should I watch for while using this medication? Visit your doctor or health care professional for regular checks on your progress. Your doctor or health care professional may order blood tests andother tests to see how you are doing. Call your doctor or health care professional for advice if you get a fever, chills or sore throat, or other symptoms of a cold or flu. Do not treat yourself. This drug may decrease your body's ability to fight infection. Try toavoid being around people who are sick. You should make sure you get enough calcium and vitamin D while you are taking this medicine, unless your doctor tells you not to. Discuss the foods you eatand the vitamins you take with your health care professional. See your dentist regularly. Brush and floss your teeth as directed. Before youhave any dental work done, tell your dentist you are receiving this medicine. Do not become pregnant while taking this medicine or for 5 months after stopping it. Talk with your doctor or health care professional about your  birth control options while taking this medicine. Women should inform their doctor if they wish to become pregnant or think they might be pregnant. There is a potential for serious side effects to an unborn child. Talk to your health  careprofessional or pharmacist for more information. What side effects may I notice from receiving this medication? Side effects that you should report to your doctor or health care professionalas soon as possible: allergic reactions like skin rash, itching or hives, swelling of the face, lips, or tongue bone pain breathing problems dizziness jaw pain, especially after dental work redness, blistering, peeling of the skin signs and symptoms of infection like fever or chills; cough; sore throat; pain or trouble passing urine signs of low calcium like fast heartbeat, muscle cramps or muscle pain; pain, tingling, numbness in the hands or feet; seizures unusual bleeding or bruising unusually weak or tired Side effects that usually do not require medical attention (report to yourdoctor or health care professional if they continue or are bothersome): constipation diarrhea headache joint pain loss of appetite muscle pain runny nose tiredness upset stomach This list may not describe all possible side effects. Call your doctor for medical advice about side effects. You may report side effects to FDA at1-800-FDA-1088. Where should I keep my medication? This medicine is only given in a clinic, doctor's office, or other health caresetting and will not be stored at home. NOTE: This sheet is a summary. It may not cover all possible information. If you have questions about this medicine, talk to your doctor, pharmacist, orhealth care provider.  2022 Elsevier/Gold Standard (2017-06-27 16:10:44)  Leuprolide depot injection What is this medication? LEUPROLIDE (loo PROE lide) is a man-made protein that acts like a natural hormone in the body. It decreases testosterone in men and decreases estrogen in women. In men, this medicine is used to treat advanced prostate cancer. In women, some forms of this medicine may be used to treat endometriosis, uterinefibroids, or other male hormone-related  problems. This medicine may be used for other purposes; ask your health care provider orpharmacist if you have questions. COMMON BRAND NAME(S): Eligard, Fensolv, Lupron Depot, Lupron Depot-Ped, Viadur What should I tell my care team before I take this medication? They need to know if you have any of these conditions: diabetes heart disease or previous heart attack high blood pressure high cholesterol mental illness osteoporosis pain or difficulty passing urine seizures spinal cord metastasis stroke suicidal thoughts, plans, or attempt; a previous suicide attempt by you or a family member tobacco smoker unusual vaginal bleeding (women) an unusual or allergic reaction to leuprolide, benzyl alcohol, other medicines, foods, dyes, or preservatives pregnant or trying to get pregnant breast-feeding How should I use this medication? This medicine is for injection into a muscle or for injection under the skin. It is given by a health care professional in a hospital or clinic setting. The specific product will determine how it will be given to you. Make sure youunderstand which product you receive and how often you will receive it. Talk to your pediatrician regarding the use of this medicine in children.Special care may be needed. Overdosage: If you think you have taken too much of this medicine contact apoison control center or emergency room at once. NOTE: This medicine is only for you. Do not share this medicine with others. What if I miss a dose? It is important not to miss a dose. Call your doctor or health careprofessional if you are unable to keep an appointment. Depot  injections: Depot injections are given either once-monthly, every 12 weeks, every 16 weeks, or every 24 weeks depending on the product you are prescribed. The product you are prescribed will be based on if you are male orfemale, and your condition. Make sure you understand your product and dosing. What may interact with this  medication? Do not take this medicine with any of the following medications: chasteberry cisapride dronedarone pimozide thioridazine This medicine may also interact with the following medications: herbal or dietary supplements, like black cohosh or DHEA male hormones, like estrogens or progestins and birth control pills, patches, rings, or injections male hormones, like testosterone other medicines that prolong the QT interval (abnormal heart rhythm) This list may not describe all possible interactions. Give your health care provider a list of all the medicines, herbs, non-prescription drugs, or dietary supplements you use. Also tell them if you smoke, drink alcohol, or use illegaldrugs. Some items may interact with your medicine. What should I watch for while using this medication? Visit your doctor or health care professional for regular checks on your progress. During the first weeks of treatment, your symptoms may get worse, but then will improve as you continue your treatment. You may get hot flashes, increased bone pain, increased difficulty passing urine, or an aggravation of nerve symptoms. Discuss these effects with your doctor or health careprofessional, some of them may improve with continued use of this medicine. Male patients may experience a menstrual cycle or spotting during the first months of therapy with this medicine. If this continues, contact your doctor orhealth care professional. This medicine may increase blood sugar. Ask your healthcare provider if changesin diet or medicines are needed if you have diabetes. What side effects may I notice from receiving this medication? Side effects that you should report to your doctor or health care professionalas soon as possible: allergic reactions like skin rash, itching or hives, swelling of the face, lips, or tongue breathing problems chest pain depression or memory disorders pain in your legs or groin pain at site where  injected or implanted seizures severe headache signs and symptoms of high blood sugar such as being more thirsty or hungry or having to urinate more than normal. You may also feel very tired or have blurry vision swelling of the feet and legs suicidal thoughts or other mood changes visual changes vomiting Side effects that usually do not require medical attention (report to yourdoctor or health care professional if they continue or are bothersome): breast swelling or tenderness decrease in sex drive or performance diarrhea hot flashes loss of appetite muscle, joint, or bone pains nausea redness or irritation at site where injected or implanted skin problems or acne This list may not describe all possible side effects. Call your doctor for medical advice about side effects. You may report side effects to FDA at1-800-FDA-1088. Where should I keep my medication? This drug is given in a hospital or clinic and will not be stored at home. NOTE: This sheet is a summary. It may not cover all possible information. If you have questions about this medicine, talk to your doctor, pharmacist, orhealth care provider.  2022 Elsevier/Gold Standard (2019-01-20 10:35:13) Leuprolide injection What is this medication? LEUPROLIDE (loo PROE lide) is a man-made hormone. It is used to treat the symptoms of prostate cancer. This medicine may also be used to treat children with early onset of puberty. It may be used for other hormonal conditions. This medicine may be used for other purposes; ask your health care  provider or pharmacist if you have questions. COMMON BRAND NAME(S): Lupron What should I tell my care team before I take this medication? They need to know if you have any of these conditions: diabetes heart disease or previous heart attack high blood pressure high cholesterol pain or difficulty passing urine spinal cord metastasis stroke tobacco smoker an unusual or allergic reaction to  leuprolide, benzyl alcohol, other medicines, foods, dyes, or preservatives pregnant or trying to get pregnant breast-feeding How should I use this medication? This medicine is for injection under the skin or into a muscle. You will be taught how to prepare and give this medicine. Use exactly as directed. Take your medicine at regular intervals. Do not take your medicine more often than directed. It is important that you put your used needles and syringes in a special sharps container. Do not put them in a trash can. If you do not have a sharps container, call your pharmacist or healthcare provider to get one. A special MedGuide will be given to you by the pharmacist with each prescription and refill. Be sure to read this information carefully each time. Talk to your pediatrician regarding the use of this medicine in children. While this medicine may be prescribed for children as young as 8 years for selected conditions, precautions do apply. Overdosage: If you think you have taken too much of this medicine contact a poison control center or emergency room at once. NOTE: This medicine is only for you. Do not share this medicine with others. What if I miss a dose? If you miss a dose, take it as soon as you can. If it is almost time for your next dose, take only that dose. Do not take double or extra doses. What may interact with this medication? Do not take this medicine with any of the following medications: chasteberry cisapride dronedarone pimozide thioridazine This medicine may also interact with the following medications: herbal or dietary supplements, like black cohosh or DHEA male hormones, like estrogens or progestins and birth control pills, patches, rings, or injections male hormones, like testosterone other medicines that prolong the QT interval (abnormal heart rhythm) This list may not describe all possible interactions. Give your health care provider a list of all the medicines,  herbs, non-prescription drugs, or dietary supplements you use. Also tell them if you smoke, drink alcohol, or use illegal drugs. Some items may interact with your medicine. What should I watch for while using this medication? Visit your doctor or health care professional for regular checks on your progress. During the first week, your symptoms may get worse, but then will improve as you continue your treatment. You may get hot flashes, increased bone pain, increased difficulty passing urine, or an aggravation of nerve symptoms. Discuss these effects with your doctor or health care professional, some of them may improve with continued use of this medicine. Male patients may experience a menstrual cycle or spotting during the first 2 months of therapy with this medicine. If this continues, contact your doctor or health care professional. This medicine may increase blood sugar. Ask your healthcare provider if changes in diet or medicines are needed if you have diabetes. What side effects may I notice from receiving this medication? Side effects that you should report to your doctor or health care professional as soon as possible: allergic reactions like skin rash, itching or hives, swelling of the face, lips, or tongue breathing problems chest pain depression or memory disorders pain in your legs or groin  pain at site where injected severe headache signs and symptoms of high blood sugar such as being more thirsty or hungry or having to urinate more than normal. You may also feel very tired or have blurry vision swelling of the feet and legs visual changes vomiting Side effects that usually do not require medical attention (report to your doctor or health care professional if they continue or are bothersome): breast swelling or tenderness decrease in sex drive or performance diarrhea hot flashes loss of appetite muscle, joint, or bone pains nausea redness or irritation at site where  injected skin problems or acne This list may not describe all possible side effects. Call your doctor for medical advice about side effects. You may report side effects to FDA at 1-800-FDA-1088. Where should I keep my medication? Keep out of the reach of children. Store below 25 degrees C (77 degrees F). Do not freeze. Protect from light. Do not use if it is not clear or if there are particles present. Throw away any unused medicine after the expiration date. NOTE: This sheet is a summary. It may not cover all possible information. If you have questions about this medicine, talk to your doctor, pharmacist, or health care provider.  2022 Elsevier/Gold Standard (2019-01-20 10:57:41)
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
## 2020-12-12 LAB — PSA, TOTAL AND FREE
PSA, Free Pct: 42 %
PSA, Free: 0.21 ng/mL
Prostate Specific Ag, Serum: 0.5 ng/mL (ref 0.0–4.0)

## 2020-12-12 LAB — TESTOSTERONE: Testosterone: <3 ng/dL — ABNORMAL LOW (ref 264–916)

## 2021-02-13 ENCOUNTER — Other Ambulatory Visit: Payer: Self-pay

## 2021-02-13 DIAGNOSIS — C7951 Secondary malignant neoplasm of bone: Secondary | ICD-10-CM

## 2021-02-13 MED ORDER — ABIRATERONE ACETATE 250 MG PO TABS: 500.0000 mg | ORAL_TABLET | Freq: Every day | ORAL | 6 refills | Status: DC

## 2021-02-20 ENCOUNTER — Telehealth: Payer: Self-pay

## 2021-02-20 NOTE — Telephone Encounter (Signed)
Spoke with pt who states he has already spoke with [facility] about the [place] shipment. Advised pt of the number and website to look for assistance for coverage for future Zytiga refills. Pt stated understanding.

## 2021-02-20 NOTE — Telephone Encounter (Signed)
Received fax from [place] requesting the status of Zytiga. Called the pharmacy for verification and they stated that they have tried to reach the pt several times with no luck. Verified phone number on file. They also stated that this was the last time they will refill the medication for pt due to coverage ending with [employer] and that pt needed to call [phone] or visit [email or web address] for other coverage options for Zytiga. Called the pt and left a detailed message of above (ok per DPR) and contacted pt's emergency contact and LM to have pt call the office.

## 2021-02-28 ENCOUNTER — Telehealth: Payer: Self-pay

## 2021-02-28 NOTE — Telephone Encounter (Signed)
Returned patient's call to find out what was his question about his Zytiga prescription. He stated that he recently got married and because his income increased he cannot benefit anymore from the medication financial assistance program. He also stated that he has a new insurance through his wife and [employer], but he did not call to find out if they cover his medication.  I advised him to call his insurance and find out what would be his out of pocket cost and call us back if the amount is unreasonable so Dr. Marin Olp can look into other alternatives.Patient verbalized understanding.

## 2021-03-01 ENCOUNTER — Telehealth: Payer: Self-pay | Admitting: *Deleted

## 2021-03-01 NOTE — Telephone Encounter (Signed)
Message received from patient stating that he did contact his insurance company regarding Zytiga prescription and that his co pay for the [facility] will be approx $100.00 a month which he states that he can financially afford.

## 2021-03-13 ENCOUNTER — Encounter: Payer: Self-pay | Admitting: Hematology & Oncology

## 2021-03-13 ENCOUNTER — Inpatient Hospital Stay: Payer: 59

## 2021-03-13 ENCOUNTER — Inpatient Hospital Stay: Payer: 59 | Admitting: Hematology & Oncology

## 2021-03-13 ENCOUNTER — Other Ambulatory Visit: Payer: Self-pay

## 2021-03-13 ENCOUNTER — Telehealth: Payer: Self-pay | Admitting: *Deleted

## 2021-03-13 ENCOUNTER — Inpatient Hospital Stay: Payer: 59 | Attending: Oncology

## 2021-03-13 VITALS — BP 145/72 | HR 56 | Temp 99.0°F | Resp 16 | Wt 238.2 lb

## 2021-03-13 DIAGNOSIS — C7951 Secondary malignant neoplasm of bone: Secondary | ICD-10-CM

## 2021-03-13 DIAGNOSIS — C61 Malignant neoplasm of prostate: Secondary | ICD-10-CM | POA: Insufficient documentation

## 2021-03-13 DIAGNOSIS — Z79899 Other long term (current) drug therapy: Secondary | ICD-10-CM | POA: Insufficient documentation

## 2021-03-13 DIAGNOSIS — C7800 Secondary malignant neoplasm of unspecified lung: Secondary | ICD-10-CM

## 2021-03-13 LAB — CBC WITH DIFFERENTIAL (CANCER CENTER ONLY)
Abs Immature Granulocytes: 0.03 10*3/uL (ref 0.00–0.07)
Basophils Absolute: 0 10*3/uL (ref 0.0–0.1)
Basophils Relative: 0 %
Eosinophils Absolute: 0.1 10*3/uL (ref 0.0–0.5)
Eosinophils Relative: 1 %
HCT: 39.7 % (ref 39.0–52.0)
Hemoglobin: 13.4 g/dL (ref 13.0–17.0)
Immature Granulocytes: 0 %
Lymphocytes Relative: 51 %
Lymphs Abs: 4.4 10*3/uL — ABNORMAL HIGH (ref 0.7–4.0)
MCH: 33.3 pg (ref 26.0–34.0)
MCHC: 33.8 g/dL (ref 30.0–36.0)
MCV: 98.8 fL (ref 80.0–100.0)
Monocytes Absolute: 0.7 10*3/uL (ref 0.1–1.0)
Monocytes Relative: 8 %
Neutro Abs: 3.5 10*3/uL (ref 1.7–7.7)
Neutrophils Relative %: 40 %
Platelet Count: 175 10*3/uL (ref 150–400)
RBC: 4.02 MIL/uL — ABNORMAL LOW (ref 4.22–5.81)
RDW: 12.4 % (ref 11.5–15.5)
WBC Count: 8.7 10*3/uL (ref 4.0–10.5)
nRBC: 0 % (ref 0.0–0.2)

## 2021-03-13 LAB — CMP (CANCER CENTER ONLY)
ALT: 31 U/L (ref 0–44)
AST: 23 U/L (ref 15–41)
Albumin: 4.2 g/dL (ref 3.5–5.0)
Alkaline Phosphatase: 52 U/L (ref 38–126)
Anion gap: 8 (ref 5–15)
BUN: 17 mg/dL (ref 8–23)
CO2: 25 mmol/L (ref 22–32)
Calcium: 9.7 mg/dL (ref 8.9–10.3)
Chloride: 107 mmol/L (ref 98–111)
Creatinine: 1.06 mg/dL (ref 0.61–1.24)
GFR, Estimated: >60 mL/min (ref >60)
Glucose, Bld: 115 mg/dL — ABNORMAL HIGH (ref 70–99)
Potassium: 4 mmol/L (ref 3.5–5.1)
Sodium: 140 mmol/L (ref 135–145)
Total Bilirubin: 0.6 mg/dL (ref 0.3–1.2)
Total Protein: 7.1 g/dL (ref 6.5–8.1)

## 2021-03-13 MED ORDER — TADALAFIL 10 MG PO TABS: 10.0000 mg | ORAL_TABLET | Freq: Every day | ORAL | 3 refills | Status: DC | PRN

## 2021-03-13 MED ORDER — DENOSUMAB 120 MG/1.7ML ~~LOC~~ SOLN
120.0000 mg | Freq: Once | SUBCUTANEOUS | Status: AC
  Administered 2021-03-13: 120 mg via SUBCUTANEOUS
  Filled 2021-03-13: qty 1.7

## 2021-03-13 MED ORDER — LEUPROLIDE ACETATE (3 MONTH) 22.5 MG ~~LOC~~ KIT
22.5000 mg | PACK | Freq: Once | SUBCUTANEOUS | Status: AC
  Administered 2021-03-13: 22.5 mg via SUBCUTANEOUS
  Filled 2021-03-13: qty 22.5

## 2021-03-13 NOTE — Progress Notes (Signed)
Hematology and Oncology Follow Up Visit  Randall Carter [id number] [birth year] 76 y.o. 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 daily-start on 09/14/2020 -- changed to 500 mg po q day on 12/11/2020 Xgeva 120 mg IM q. 3 months-next dose on 06/2021     Interim History:  Randall Carter is back for his follow-up.  He is now married.  He got married I think back in November.  He is doing very life.  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  I will call in some Cialis (10 mg p.o. daily) to see if this might help a little bit.  His last PSA in October was 0.5.  His last testosterone was less than 3.  He saw some hot flashes.  He has had no obvious change in bowel or bladder habits.  He has had no issues with cough.  He has had no problems with nausea or vomiting.  He has had no rashes.  There is been no leg swelling.  Overall, I would say his performance status is ECOG 1.    Medications:  Current Outpatient Medications:    abiraterone acetate (ZYTIGA) 250 MG tablet, Take 2 tablets (500 mg total) by mouth daily. Take on an empty stomach 1 hour before or 2 hours after a meal, Disp: 60 tablet, Rfl: 6   Accu-Chek FastClix Lancets MISC, Use one needle daily, Disp: , Rfl:    aspirin 81 MG EC tablet, Take by mouth., Disp: , Rfl:    atenolol (TENORMIN) 50 MG tablet, Take 50 mg by mouth daily., Disp: , Rfl:    Cholecalciferol 125 MCG (5000 UT) TABS, Take by mouth., Disp: , Rfl:    glucose blood test strip, Test glucose one time per day, Disp: , Rfl:    Multiple Vitamin (MULTI-VITAMIN) tablet, Take 1 tablet by mouth daily., Disp: , Rfl:    NIFEdipine (ADALAT CC) 30 MG 24 hr tablet, Take 30 mg by mouth daily., Disp: , Rfl:    predniSONE (DELTASONE) 5 MG tablet, Take 1 tablet (5 mg  total) by mouth daily with breakfast., Disp: 30 tablet, Rfl: 6   simvastatin (ZOCOR) 40 MG tablet, Take 40 mg by mouth at bedtime., Disp: , Rfl:    vitamin B-12 (CYANOCOBALAMIN) 500 MCG tablet, Take by mouth., Disp: , Rfl:    albuterol (VENTOLIN HFA) 108 (90 Base) MCG/ACT inhaler, Inhale 2 puffs into the lungs every 6 (six) hours as needed. (Patient not taking: Reported on 03/13/2021), Disp: , Rfl:   Allergies: No Known Allergies  Past Medical History, Surgical history, Social history, and Family History were reviewed and updated.  Review of Systems: Review of Systems  Constitutional: Negative.   HENT:  Negative.    Eyes: Negative.   Respiratory: Negative.    Cardiovascular: Negative.   Gastrointestinal: Negative.   Endocrine: Positive for hot flashes.  Genitourinary: Negative.    Musculoskeletal: Negative.   Skin: Negative.   Neurological: Negative.   Hematological: Negative.   Psychiatric/Behavioral: Negative.     Physical Exam:  weight is 238 lb 4 oz (108.1 kg). His oral temperature is 99 F (37.2 C). His blood pressure is 145/72 (abnormal) and his pulse is 56 (abnormal). His respiration is 16 and oxygen saturation is 100%.   Wt Readings from Last 3 Encounters:  03/13/21 238 lb 4 oz (108.1 kg)  12/11/20 235 lb 12.8 oz (107 kg)  11/10/20 233 lb (105.7 kg)    Physical Exam Vitals reviewed.  HENT:     Head: Normocephalic and atraumatic.  Eyes:     Pupils: Pupils are equal, round, and reactive to light.  Cardiovascular:     Rate and Rhythm: Normal rate and regular rhythm.     Heart sounds: Normal heart sounds.  Pulmonary:     Effort: Pulmonary effort is normal.     Breath sounds: Normal breath sounds.  Abdominal:     General: Bowel sounds are normal.     Palpations: Abdomen is soft.  Musculoskeletal:        General: No tenderness or deformity. Normal range of motion.     Cervical back: Normal range of motion.  Lymphadenopathy:     Cervical: No cervical adenopathy.   Skin:    General: Skin is warm and dry.     Findings: No erythema or rash.  Neurological:     Mental Status: He is alert and oriented to person, place, and time.  Psychiatric:        Behavior: Behavior normal.        Thought Content: Thought content normal.        Judgment: Judgment normal.     Lab Results  Component Value Date   WBC 8.7 03/13/2021   HGB 13.4 03/13/2021   HCT 39.7 03/13/2021   MCV 98.8 03/13/2021   PLT 175 03/13/2021     Chemistry      Component Value Date/Time   NA 140 03/13/2021 0902   K 4.0 03/13/2021 0902   CL 107 03/13/2021 0902   CO2 25 03/13/2021 0902   BUN 17 03/13/2021 0902   CREATININE 1.06 03/13/2021 0902      Component Value Date/Time   CALCIUM 9.7 03/13/2021 0902   ALKPHOS 52 03/13/2021 0902   AST 23 03/13/2021 0902   ALT 31 03/13/2021 0902   BILITOT 0.6 03/13/2021 0902      Impression and Plan: Randall Carter is a very nice 76 year old African-American male.  He has castrate sensitive prostate cancer.  So far, he is done very well.  We will see what his PSA is.  May be, we will be able to move his injections to 4 or 6 months.  Appeared to mention that he is on Zytiga.  He is doing well on the Zytiga.  He is on 500 mg a day.  We have try to get a prostate-specific nuclear medicine test on him.  Unfortunately, his insurance company will not let us do this.  We will plan for another 85-month follow-up.    [name], MD 1/10/20239:45 AM

## 2021-03-13 NOTE — Patient Instructions (Signed)
Leuprolide depot injection What is this medication? LEUPROLIDE (loo PROE lide) is a man-made protein that acts like a natural hormone in the body. It decreases testosterone in men and decreases estrogen in women. In men, this medicine is used to treat advanced prostate cancer. In women, some forms of this medicine may be used to treat endometriosis, uterine fibroids, or other male hormone-related problems. This medicine may be used for other purposes; ask your health care provider or pharmacist if you have questions. COMMON BRAND NAME(S): Eligard, Fensolv, Lupron Depot, Lupron Depot-Ped, Viadur What should I tell my care team before I take this medication? They need to know if you have any of these conditions: diabetes heart disease or previous heart attack high blood pressure high cholesterol mental illness osteoporosis pain or difficulty passing urine seizures spinal cord metastasis stroke suicidal thoughts, plans, or attempt; a previous suicide attempt by you or a family member tobacco smoker unusual vaginal bleeding (women) an unusual or allergic reaction to leuprolide, benzyl alcohol, other medicines, foods, dyes, or preservatives pregnant or trying to get pregnant breast-feeding How should I use this medication? This medicine is for injection into a muscle or for injection under the skin. It is given by a health care professional in a hospital or clinic setting. The specific product will determine how it will be given to you. Make sure you understand which product you receive and how often you will receive it. Talk to your pediatrician regarding the use of this medicine in children. Special care may be needed. Overdosage: If you think you have taken too much of this medicine contact a poison control center or emergency room at once. NOTE: This medicine is only for you. Do not share this medicine with others. What if I miss a dose? It is important not to miss a dose. Call your  doctor or health care professional if you are unable to keep an appointment. Depot injections: Depot injections are given either once-monthly, every 12 weeks, every 16 weeks, or every 24 weeks depending on the product you are prescribed. The product you are prescribed will be based on if you are male or male, and your condition. Make sure you understand your product and dosing. What may interact with this medication? Do not take this medicine with any of the following medications: chasteberry cisapride dronedarone pimozide thioridazine This medicine may also interact with the following medications: herbal or dietary supplements, like black cohosh or DHEA male hormones, like estrogens or progestins and birth control pills, patches, rings, or injections male hormones, like testosterone other medicines that prolong the QT interval (abnormal heart rhythm) This list may not describe all possible interactions. Give your health care provider a list of all the medicines, herbs, non-prescription drugs, or dietary supplements you use. Also tell them if you smoke, drink alcohol, or use illegal drugs. Some items may interact with your medicine. What should I watch for while using this medication? Visit your doctor or health care professional for regular checks on your progress. During the first weeks of treatment, your symptoms may get worse, but then will improve as you continue your treatment. You may get hot flashes, increased bone pain, increased difficulty passing urine, or an aggravation of nerve symptoms. Discuss these effects with your doctor or health care professional, some of them may improve with continued use of this medicine. Male patients may experience a menstrual cycle or spotting during the first months of therapy with this medicine. If this continues, contact your doctor or  health care professional. This medicine may increase blood sugar. Ask your healthcare provider if changes in diet  or medicines are needed if you have diabetes. What side effects may I notice from receiving this medication? Side effects that you should report to your doctor or health care professional as soon as possible: allergic reactions like skin rash, itching or hives, swelling of the face, lips, or tongue breathing problems chest pain depression or memory disorders pain in your legs or groin pain at site where injected or implanted seizures severe headache signs and symptoms of high blood sugar such as being more thirsty or hungry or having to urinate more than normal. You may also feel very tired or have blurry vision swelling of the feet and legs suicidal thoughts or other mood changes visual changes vomiting Side effects that usually do not require medical attention (report to your doctor or health care professional if they continue or are bothersome): breast swelling or tenderness decrease in sex drive or performance diarrhea hot flashes loss of appetite muscle, joint, or bone pains nausea redness or irritation at site where injected or implanted skin problems or acne This list may not describe all possible side effects. Call your doctor for medical advice about side effects. You may report side effects to FDA at 1-800-FDA-1088. Where should I keep my medication? This drug is given in a hospital or clinic and will not be stored at home. NOTE: This sheet is a summary. It may not cover all possible information. If you have questions about this medicine, talk to your doctor, pharmacist, or health care provider.  2022 Elsevier/Gold Standard (2020-11-07 00:00:00) Denosumab injection What is this medication? DENOSUMAB (den oh sue mab) slows bone breakdown. Prolia is used to treat osteoporosis in women after menopause and in men, and in people who are taking corticosteroids for 6 months or more. Delton See is used to treat a high calcium level due to cancer and to prevent bone fractures and other  bone problems caused by multiple myeloma or cancer bone metastases. Delton See is also used to treat giant cell tumor of the bone. This medicine may be used for other purposes; ask your health care provider or pharmacist if you have questions. COMMON BRAND NAME(S): Prolia, XGEVA What should I tell my care team before I take this medication? They need to know if you have any of these conditions: dental disease having surgery or tooth extraction infection kidney disease low levels of calcium or Vitamin D in the blood malnutrition on hemodialysis skin conditions or sensitivity thyroid or parathyroid disease an unusual reaction to denosumab, other medicines, foods, dyes, or preservatives pregnant or trying to get pregnant breast-feeding How should I use this medication? This medicine is for injection under the skin. It is given by a health care professional in a hospital or clinic setting. A special MedGuide will be given to you before each treatment. Be sure to read this information carefully each time. For Prolia, talk to your pediatrician regarding the use of this medicine in children. Special care may be needed. For Delton See, talk to your pediatrician regarding the use of this medicine in children. While this drug may be prescribed for children as young as 13 years for selected conditions, precautions do apply. Overdosage: If you think you have taken too much of this medicine contact a poison control center or emergency room at once. NOTE: This medicine is only for you. Do not share this medicine with others. What if I miss a dose? It  is important not to miss your dose. Call your doctor or health care professional if you are unable to keep an appointment. What may interact with this medication? Do not take this medicine with any of the following medications: other medicines containing denosumab This medicine may also interact with the following medications: medicines that lower your chance of  fighting infection steroid medicines like prednisone or cortisone This list may not describe all possible interactions. Give your health care provider a list of all the medicines, herbs, non-prescription drugs, or dietary supplements you use. Also tell them if you smoke, drink alcohol, or use illegal drugs. Some items may interact with your medicine. What should I watch for while using this medication? Visit your doctor or health care professional for regular checks on your progress. Your doctor or health care professional may order blood tests and other tests to see how you are doing. Call your doctor or health care professional for advice if you get a fever, chills or sore throat, or other symptoms of a cold or flu. Do not treat yourself. This drug may decrease your body's ability to fight infection. Try to avoid being around people who are sick. You should make sure you get enough calcium and vitamin D while you are taking this medicine, unless your doctor tells you not to. Discuss the foods you eat and the vitamins you take with your health care professional. See your dentist regularly. Brush and floss your teeth as directed. Before you have any dental work done, tell your dentist you are receiving this medicine. Do not become pregnant while taking this medicine or for 5 months after stopping it. Talk with your doctor or health care professional about your birth control options while taking this medicine. Women should inform their doctor if they wish to become pregnant or think they might be pregnant. There is a potential for serious side effects to an unborn child. Talk to your health care professional or pharmacist for more information. What side effects may I notice from receiving this medication? Side effects that you should report to your doctor or health care professional as soon as possible: allergic reactions like skin rash, itching or hives, swelling of the face, lips, or tongue bone  pain breathing problems dizziness jaw pain, especially after dental work redness, blistering, peeling of the skin signs and symptoms of infection like fever or chills; cough; sore throat; pain or trouble passing urine signs of low calcium like fast heartbeat, muscle cramps or muscle pain; pain, tingling, numbness in the hands or feet; seizures unusual bleeding or bruising unusually weak or tired Side effects that usually do not require medical attention (report to your doctor or health care professional if they continue or are bothersome): constipation diarrhea headache joint pain loss of appetite muscle pain runny nose tiredness upset stomach This list may not describe all possible side effects. Call your doctor for medical advice about side effects. You may report side effects to FDA at 1-800-FDA-1088. Where should I keep my medication? This medicine is only given in a clinic, doctor's office, or other health care setting and will not be stored at home. NOTE: This sheet is a summary. It may not cover all possible information. If you have questions about this medicine, talk to your doctor, pharmacist, or health care provider.  2022 Elsevier/Gold Standard (2017-06-27 00:00:00)

## 2021-03-13 NOTE — Telephone Encounter (Signed)
Per 03/13/20 los - gave upcoming appointments - confirmed

## 2021-03-14 ENCOUNTER — Telehealth: Payer: Self-pay

## 2021-03-14 LAB — PSA, TOTAL AND FREE
PSA, Free Pct: UNDETERMINED %
PSA, Free: <0.02 ng/mL
Prostate Specific Ag, Serum: <0.1 ng/mL (ref 0.0–4.0)

## 2021-03-14 LAB — TESTOSTERONE: Testosterone: <3 ng/dL — ABNORMAL LOW (ref 264–916)

## 2021-03-14 NOTE — Telephone Encounter (Signed)
Called and LM informing patient of lab results (ok per DPR), requested a CB with any questions or concerns.   

## 2021-03-14 NOTE — Telephone Encounter (Signed)
-----   Message from [name], MD sent at 03/14/2021 12:41 PM EST ----- Call - the PSA is not found now!!!!  [place] job!!  Laurey Arrow

## 2021-03-28 ENCOUNTER — Other Ambulatory Visit: Payer: Self-pay | Admitting: Hematology & Oncology

## 2021-03-28 DIAGNOSIS — C61 Malignant neoplasm of prostate: Secondary | ICD-10-CM

## 2021-04-18 ENCOUNTER — Telehealth: Payer: Self-pay | Admitting: *Deleted

## 2021-04-18 NOTE — Telephone Encounter (Signed)
Called patient and lvm of referral being sent to [facility] for PET scan and to be on the lookout for their call.

## 2021-04-18 NOTE — Telephone Encounter (Signed)
Faxed referral to [facility] to the radiology dept. For PET scan skull to mid thigh [phone]

## 2021-04-26 ENCOUNTER — Telehealth: Payer: Self-pay | Admitting: *Deleted

## 2021-04-26 NOTE — Telephone Encounter (Signed)
Returned patient's phone call regarding nausea medications. He stated,"I'm at [facility] ER right now. I had nausea, vomiting x 3 hours, and abdominal pain. They have only done blood work. I have not been seen yet." I told him I would inform Dr. Marin Olp. He verbalized understanding.

## 2021-05-02 ENCOUNTER — Encounter: Payer: Self-pay | Admitting: Hematology & Oncology

## 2021-05-02 ENCOUNTER — Other Ambulatory Visit: Payer: Self-pay

## 2021-05-02 ENCOUNTER — Inpatient Hospital Stay: Payer: Medicare Other | Attending: Oncology

## 2021-05-02 ENCOUNTER — Inpatient Hospital Stay (HOSPITAL_BASED_OUTPATIENT_CLINIC_OR_DEPARTMENT_OTHER): Payer: Medicare Other | Admitting: Hematology & Oncology

## 2021-05-02 VITALS — BP 142/72 | HR 66 | Temp 98.6°F | Resp 18 | Wt 240.0 lb

## 2021-05-02 DIAGNOSIS — C7951 Secondary malignant neoplasm of bone: Secondary | ICD-10-CM | POA: Diagnosis present

## 2021-05-02 DIAGNOSIS — Z79899 Other long term (current) drug therapy: Secondary | ICD-10-CM | POA: Insufficient documentation

## 2021-05-02 DIAGNOSIS — C61 Malignant neoplasm of prostate: Secondary | ICD-10-CM

## 2021-05-02 LAB — CBC WITH DIFFERENTIAL (CANCER CENTER ONLY)
Abs Immature Granulocytes: 0.01 10*3/uL (ref 0.00–0.07)
Basophils Absolute: 0 10*3/uL (ref 0.0–0.1)
Basophils Relative: 0 %
Eosinophils Absolute: 0.2 10*3/uL (ref 0.0–0.5)
Eosinophils Relative: 2 %
HCT: 36.8 % — ABNORMAL LOW (ref 39.0–52.0)
Hemoglobin: 12.8 g/dL — ABNORMAL LOW (ref 13.0–17.0)
Immature Granulocytes: 0 %
Lymphocytes Relative: 48 %
Lymphs Abs: 4.4 10*3/uL — ABNORMAL HIGH (ref 0.7–4.0)
MCH: 33.7 pg (ref 26.0–34.0)
MCHC: 34.8 g/dL (ref 30.0–36.0)
MCV: 96.8 fL (ref 80.0–100.0)
Monocytes Absolute: 0.8 10*3/uL (ref 0.1–1.0)
Monocytes Relative: 8 %
Neutro Abs: 3.9 10*3/uL (ref 1.7–7.7)
Neutrophils Relative %: 42 %
Platelet Count: 169 10*3/uL (ref 150–400)
RBC: 3.8 MIL/uL — ABNORMAL LOW (ref 4.22–5.81)
RDW: 12.4 % (ref 11.5–15.5)
WBC Count: 9.2 10*3/uL (ref 4.0–10.5)
nRBC: 0 % (ref 0.0–0.2)

## 2021-05-02 LAB — CMP (CANCER CENTER ONLY)
ALT: 22 U/L (ref 0–44)
AST: 20 U/L (ref 15–41)
Albumin: 3.7 g/dL (ref 3.5–5.0)
Alkaline Phosphatase: 40 U/L (ref 38–126)
Anion gap: 8 (ref 5–15)
BUN: 19 mg/dL (ref 8–23)
CO2: 25 mmol/L (ref 22–32)
Calcium: 8.7 mg/dL — ABNORMAL LOW (ref 8.9–10.3)
Chloride: 107 mmol/L (ref 98–111)
Creatinine: 1.02 mg/dL (ref 0.61–1.24)
GFR, Estimated: >60 mL/min (ref >60)
Glucose, Bld: 106 mg/dL — ABNORMAL HIGH (ref 70–99)
Potassium: 3.6 mmol/L (ref 3.5–5.1)
Sodium: 140 mmol/L (ref 135–145)
Total Bilirubin: 0.6 mg/dL (ref 0.3–1.2)
Total Protein: 6.9 g/dL (ref 6.5–8.1)

## 2021-05-02 NOTE — Progress Notes (Signed)
?Hematology and Oncology Follow Up Visit ? ?VINT POLA ?[id number] ?03-25-1945 76 y.o. ?05/02/2021 ? ? 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 ?Current Therapy:   ?Lupron- q 3 month dosing --changed on 12/08/2020 ?Zytiga 1000 mg p.o. daily-start on 09/14/2020 -- changed to 500 mg po q day on 12/11/2020 ?Xgeva 120 mg IM q. 3 months-next dose on 06/2021 ?    ?Interim History:  Mr. Deeley is back for his follow-up.  We did do a PET scan on him.  We finally got this approved.  This was done on 04/25/2021.  The PET scan showed 2 foci of activity within the prostate.  There is no evidence of metastatic adenopathy and no evidence of metastatic visceral metastasis or skeletal metastasis. ? ?I think he is responded incredibly well.  His last PSA was less than 0.1. ? ?I think the 1 question I have is whether or not there is any role for radiation therapy to the prostate gland.  I know his PSA is negligible.  I know that he did have metastatic disease and this is responded well. ? ?I will have to speak to Radiation Oncology to see if there think there is a role for radiation. ? ?He did not do the Cialis that I gave him.  I think it may have been a little too expensive. ? ?He has been dealing with a kidney stone.  He did have a CT scan done on 04/26/2021.  This was done because he has some abdominal pain.  He had a 3 mm obstructing lesion in the right ureterovesicular junction.  He is going to see urology about this.  I think he was placed on Flomax. ? ?He has had no problems with nausea or vomiting.  There is no diarrhea. ? ?He tolerated the Zytiga quite nicely. ? ?He has had no rashes.  There is no cough or shortness of breath.  Thankfully, he has had no issues with COVID. ? ?Currently, I would say his performance status is probably ECOG 1.   ? ? ?Medications:  ?Current Outpatient Medications:  ?  abiraterone acetate (ZYTIGA) 250 MG tablet, Take 2 tablets (500 mg total) by mouth  daily. Take on an empty stomach 1 hour before or 2 hours after a meal, Disp: 60 tablet, Rfl: 6 ?  Accu-Chek FastClix Lancets MISC, Use one needle daily, Disp: , Rfl:  ?  albuterol (VENTOLIN HFA) 108 (90 Base) MCG/ACT inhaler, Inhale 2 puffs into the lungs every 6 (six) hours as needed., Disp: , Rfl:  ?  aspirin 81 MG EC tablet, Take by mouth., Disp: , Rfl:  ?  atenolol (TENORMIN) 50 MG tablet, Take 50 mg by mouth daily., Disp: , Rfl:  ?  cephALEXin (KEFLEX) 500 MG capsule, Take 500 mg by mouth 4 (four) times daily., Disp: , Rfl:  ?  Cholecalciferol 125 MCG (5000 UT) TABS, Take by mouth., Disp: , Rfl:  ?  glucose blood test strip, Test glucose one time per day, Disp: , Rfl:  ?  HYDROcodone-acetaminophen (NORCO/VICODIN) 5-325 MG tablet, Take 1 tablet by mouth every 8 (eight) hours as needed., Disp: , Rfl:  ?  Multiple Vitamin (MULTI-VITAMIN) tablet, Take 1 tablet by mouth daily., Disp: , Rfl:  ?  NIFEdipine (ADALAT CC) 30 MG 24 hr tablet, Take 30 mg by mouth daily., Disp: , Rfl:  ?  ondansetron (ZOFRAN-ODT) 4 MG disintegrating tablet, Take 4 mg by mouth every 8 (eight) hours as needed., Disp: , Rfl:  ?  predniSONE (DELTASONE) 5 MG tablet, TAKE 1 TABLET(5 MG) BY MOUTH DAILY WITH BREAKFAST, Disp: 30 tablet, Rfl: 6 ?  simvastatin (ZOCOR) 40 MG tablet, Take 40 mg by mouth at bedtime., Disp: , Rfl:  ?  tadalafil (CIALIS) 10 MG tablet, Take 1 tablet (10 mg total) by mouth daily as needed for erectile dysfunction., Disp: 30 tablet, Rfl: 3 ?  tamsulosin (FLOMAX) 0.4 MG CAPS capsule, Take 0.4 mg by mouth daily., Disp: , Rfl:  ?  vitamin B-12 (CYANOCOBALAMIN) 500 MCG tablet, Take by mouth., Disp: , Rfl:  ? ?Allergies: No Known Allergies ? ?Past Medical History, Surgical history, Social history, and Family History were reviewed and updated. ? ?Review of Systems: ?Review of Systems  ?Constitutional: Negative.   ?HENT:  Negative.    ?Eyes: Negative.   ?Respiratory: Negative.    ?Cardiovascular: Negative.   ?Gastrointestinal:  Negative.   ?Endocrine: Positive for hot flashes.  ?Genitourinary: Negative.    ?Musculoskeletal: Negative.   ?Skin: Negative.   ?Neurological: Negative.   ?Hematological: Negative.   ?Psychiatric/Behavioral: Negative.    ? ?Physical Exam: ? weight is 240 lb (108.9 kg). His oral temperature is 98.6 ?F (37 ?C). His blood pressure is 142/72 (abnormal) and his pulse is 66. His respiration is 18 and oxygen saturation is 99%.  ? ?Wt Readings from Last 3 Encounters:  ?05/02/21 240 lb (108.9 kg)  ?03/13/21 238 lb 4 oz (108.1 kg)  ?12/11/20 235 lb 12.8 oz (107 kg)  ? ? ?Physical Exam ?Vitals reviewed.  ?HENT:  ?   Head: Normocephalic and atraumatic.  ?Eyes:  ?   Pupils: Pupils are equal, round, and reactive to light.  ?Cardiovascular:  ?   Rate and Rhythm: Normal rate and regular rhythm.  ?   Heart sounds: Normal heart sounds.  ?Pulmonary:  ?   Effort: Pulmonary effort is normal.  ?   Breath sounds: Normal breath sounds.  ?Abdominal:  ?   General: Bowel sounds are normal.  ?   Palpations: Abdomen is soft.  ?Musculoskeletal:     ?   General: No tenderness or deformity. Normal range of motion.  ?   Cervical back: Normal range of motion.  ?Lymphadenopathy:  ?   Cervical: No cervical adenopathy.  ?Skin: ?   General: Skin is warm and dry.  ?   Findings: No erythema or rash.  ?Neurological:  ?   Mental Status: He is alert and oriented to person, place, and time.  ?Psychiatric:     ?   Behavior: Behavior normal.     ?   Thought Content: Thought content normal.     ?   Judgment: Judgment normal.  ? ? ? ?Lab Results  ?Component Value Date  ? WBC 9.2 05/02/2021  ? HGB 12.8 (L) 05/02/2021  ? HCT 36.8 (L) 05/02/2021  ? MCV 96.8 05/02/2021  ? PLT 169 05/02/2021  ? ?  Chemistry   ?   ?Component Value Date/Time  ? NA 140 03/13/2021 0902  ? K 4.0 03/13/2021 0902  ? CL 107 03/13/2021 0902  ? CO2 25 03/13/2021 0902  ? BUN 17 03/13/2021 0902  ? CREATININE 1.06 03/13/2021 0902  ?    ?Component Value Date/Time  ? CALCIUM 9.7 03/13/2021 0902  ?  ALKPHOS 52 03/13/2021 0902  ? AST 23 03/13/2021 0902  ? ALT 31 03/13/2021 0902  ? BILITOT 0.6 03/13/2021 0902  ?  ? ? ?Impression and Plan: ?Mr. Duval is a very nice 76 year old African-American male.  He  has castrate sensitive prostate cancer. ? ?I am just very happy that he has done nicely with respect to the Lupron/Zytiga in combination.  We clearly have made him androgen depleted.  Again, he still has these 2 areas in the prostate gland that are active.  Again I do not know if there is any role for radiotherapy in this situation.  I will know if there is any role for stereotactic radiotherapy.  Again I will speak with Radiation Oncology regarding this. ? ?We will have him come back in April when he comes back road normally for his Lupron and Xgeva. ? ?Again, I offered him something to help with his ED.  He has declined. ? ?Overall, Mr. Cain is really done nicely.  His PSA has normalized.  Is very happy that he has responded to androgen deprivation therapy and really has had not a lot of side effects. ? ? ? ? ?[name], MD ?3/1/20238:13 AM  ?

## 2021-05-03 LAB — PSA, TOTAL AND FREE
PSA, Free Pct: UNDETERMINED %
PSA, Free: <0.02 ng/mL
Prostate Specific Ag, Serum: <0.1 ng/mL (ref 0.0–4.0)

## 2021-05-03 LAB — TESTOSTERONE: Testosterone: <3 ng/dL — ABNORMAL LOW (ref 264–916)

## 2021-06-06 ENCOUNTER — Other Ambulatory Visit: Payer: Self-pay | Admitting: *Deleted

## 2021-06-06 ENCOUNTER — Other Ambulatory Visit: Payer: Self-pay

## 2021-06-06 ENCOUNTER — Other Ambulatory Visit (HOSPITAL_COMMUNITY): Payer: Self-pay

## 2021-06-06 ENCOUNTER — Encounter: Payer: Self-pay | Admitting: Hematology & Oncology

## 2021-06-06 DIAGNOSIS — C61 Malignant neoplasm of prostate: Secondary | ICD-10-CM

## 2021-06-06 MED ORDER — ABIRATERONE ACETATE 250 MG PO TABS
500.0000 mg | ORAL_TABLET | Freq: Every day | ORAL | 6 refills | Status: DC
  Filled 2021-06-06: qty 60, 30d supply, fill #0

## 2021-06-07 ENCOUNTER — Other Ambulatory Visit (HOSPITAL_COMMUNITY): Payer: Self-pay

## 2021-06-07 ENCOUNTER — Other Ambulatory Visit: Payer: Self-pay | Admitting: *Deleted

## 2021-06-07 DIAGNOSIS — C61 Malignant neoplasm of prostate: Secondary | ICD-10-CM

## 2021-06-07 MED ORDER — ABIRATERONE ACETATE 250 MG PO TABS: 500.0000 mg | ORAL_TABLET | Freq: Every day | ORAL | 6 refills | Status: DC

## 2021-06-11 ENCOUNTER — Ambulatory Visit: Payer: Medicare Other

## 2021-06-11 ENCOUNTER — Other Ambulatory Visit: Payer: Medicare Other

## 2021-06-11 ENCOUNTER — Ambulatory Visit: Payer: Medicare Other | Admitting: Hematology & Oncology

## 2021-06-20 ENCOUNTER — Inpatient Hospital Stay (HOSPITAL_BASED_OUTPATIENT_CLINIC_OR_DEPARTMENT_OTHER): Payer: 59 | Admitting: Hematology & Oncology

## 2021-06-20 ENCOUNTER — Inpatient Hospital Stay: Payer: 59 | Attending: Oncology

## 2021-06-20 ENCOUNTER — Inpatient Hospital Stay: Payer: 59

## 2021-06-20 ENCOUNTER — Encounter: Payer: Self-pay | Admitting: Hematology & Oncology

## 2021-06-20 ENCOUNTER — Telehealth: Payer: Self-pay | Admitting: *Deleted

## 2021-06-20 VITALS — BP 164/73 | HR 59 | Temp 98.7°F | Ht 70.5 in | Wt 237.8 lb

## 2021-06-20 DIAGNOSIS — C61 Malignant neoplasm of prostate: Secondary | ICD-10-CM

## 2021-06-20 DIAGNOSIS — Z79899 Other long term (current) drug therapy: Secondary | ICD-10-CM | POA: Diagnosis not present

## 2021-06-20 DIAGNOSIS — C7951 Secondary malignant neoplasm of bone: Secondary | ICD-10-CM

## 2021-06-20 LAB — CMP (CANCER CENTER ONLY)
ALT: 32 U/L (ref 0–44)
AST: 24 U/L (ref 15–41)
Albumin: 4.1 g/dL (ref 3.5–5.0)
Alkaline Phosphatase: 47 U/L (ref 38–126)
Anion gap: 8 (ref 5–15)
BUN: 20 mg/dL (ref 8–23)
CO2: 27 mmol/L (ref 22–32)
Calcium: 9.9 mg/dL (ref 8.9–10.3)
Chloride: 109 mmol/L (ref 98–111)
Creatinine: 1.15 mg/dL (ref 0.61–1.24)
GFR, Estimated: >60 mL/min (ref >60)
Glucose, Bld: 136 mg/dL — ABNORMAL HIGH (ref 70–99)
Potassium: 3.8 mmol/L (ref 3.5–5.1)
Sodium: 144 mmol/L (ref 135–145)
Total Bilirubin: 0.8 mg/dL (ref 0.3–1.2)
Total Protein: 7.5 g/dL (ref 6.5–8.1)

## 2021-06-20 LAB — CBC WITH DIFFERENTIAL (CANCER CENTER ONLY)
Abs Immature Granulocytes: 0.01 10*3/uL (ref 0.00–0.07)
Basophils Absolute: 0 10*3/uL (ref 0.0–0.1)
Basophils Relative: 0 %
Eosinophils Absolute: 0.1 10*3/uL (ref 0.0–0.5)
Eosinophils Relative: 2 %
HCT: 40.3 % (ref 39.0–52.0)
Hemoglobin: 13.6 g/dL (ref 13.0–17.0)
Immature Granulocytes: 0 %
Lymphocytes Relative: 50 %
Lymphs Abs: 4 10*3/uL (ref 0.7–4.0)
MCH: 33.4 pg (ref 26.0–34.0)
MCHC: 33.7 g/dL (ref 30.0–36.0)
MCV: 99 fL (ref 80.0–100.0)
Monocytes Absolute: 0.9 10*3/uL (ref 0.1–1.0)
Monocytes Relative: 11 %
Neutro Abs: 3 10*3/uL (ref 1.7–7.7)
Neutrophils Relative %: 37 %
Platelet Count: 174 10*3/uL (ref 150–400)
RBC: 4.07 MIL/uL — ABNORMAL LOW (ref 4.22–5.81)
RDW: 12 % (ref 11.5–15.5)
WBC Count: 8.1 10*3/uL (ref 4.0–10.5)
nRBC: 0 % (ref 0.0–0.2)

## 2021-06-20 MED ORDER — DENOSUMAB 120 MG/1.7ML ~~LOC~~ SOLN
120.0000 mg | Freq: Once | SUBCUTANEOUS | Status: AC
  Administered 2021-06-20: 120 mg via SUBCUTANEOUS
  Filled 2021-06-20: qty 1.7

## 2021-06-20 MED ORDER — LEUPROLIDE ACETATE (3 MONTH) 22.5 MG ~~LOC~~ KIT
22.5000 mg | PACK | Freq: Once | SUBCUTANEOUS | Status: AC
  Administered 2021-06-20: 22.5 mg via SUBCUTANEOUS
  Filled 2021-06-20: qty 22.5

## 2021-06-20 NOTE — Progress Notes (Signed)
?Hematology and Oncology Follow Up Visit ? ?DAYNA GEURTS ?[id number] ?09/09/1945 76 y.o. ?06/20/2021 ? ? 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 ?Current Therapy:   ?Lupron- q 3 month dosing --changed on 12/08/2020 ?Zytiga 1000 mg p.o. daily-start on 09/14/2020 -- changed to 500 mg po q day on 12/11/2020 ?Xgeva 120 mg IM q. 3 months-next dose on 09/2021 ?    ?Interim History:  Mr. Biglow is back for his follow-up.  He is feeling okay.  He has had no real problems since we last saw him.  He has had no problems with pain.  There is been no nausea or vomiting.  He has had no cough or shortness of breath.  There has been no problems with bowels or bladder. ? ?His last PSA was less than 0.1.  His last testosterone was less than 3.  This was back in March. ? ?He has had no problems with rashes.  He has had no leg swelling.  He has had no weakness. ? ?Overall, I would have to say that his performance status is probably ECOG 1.   ? ?Medications:  ?Current Outpatient Medications:  ?  abiraterone acetate (ZYTIGA) 250 MG tablet, Take 2 tablets (500 mg total) by mouth daily. Take on an empty stomach 1 hour before or 2 hours after a meal, Disp: 60 tablet, Rfl: 6 ?  Accu-Chek FastClix Lancets MISC, Use one needle daily, Disp: , Rfl:  ?  albuterol (VENTOLIN HFA) 108 (90 Base) MCG/ACT inhaler, Inhale 2 puffs into the lungs every 6 (six) hours as needed., Disp: , Rfl:  ?  aspirin 81 MG EC tablet, Take by mouth., Disp: , Rfl:  ?  atenolol (TENORMIN) 50 MG tablet, Take 50 mg by mouth daily., Disp: , Rfl:  ?  Cholecalciferol 125 MCG (5000 UT) TABS, Take by mouth., Disp: , Rfl:  ?  fluticasone (FLONASE) 50 MCG/ACT nasal spray, Place 1 spray into both nostrils daily., Disp: , Rfl:  ?  glucose blood test strip, Test glucose one time per day, Disp: , Rfl:  ?  Loratadine 10 MG CAPS, Take 10 mg by mouth daily., Disp: , Rfl:  ?  Multiple Vitamin (MULTI-VITAMIN) tablet, Take 1 tablet by mouth  daily., Disp: , Rfl:  ?  NIFEdipine (ADALAT CC) 30 MG 24 hr tablet, Take 30 mg by mouth daily., Disp: , Rfl:  ?  predniSONE (DELTASONE) 5 MG tablet, TAKE 1 TABLET(5 MG) BY MOUTH DAILY WITH BREAKFAST, Disp: 30 tablet, Rfl: 6 ?  simvastatin (ZOCOR) 40 MG tablet, Take 40 mg by mouth at bedtime., Disp: , Rfl:  ?  vitamin B-12 (CYANOCOBALAMIN) 500 MCG tablet, Take by mouth., Disp: , Rfl:  ?  ondansetron (ZOFRAN-ODT) 4 MG disintegrating tablet, Take 4 mg by mouth every 8 (eight) hours as needed. (Patient not taking: Reported on 06/20/2021), Disp: , Rfl:  ?  tadalafil (CIALIS) 10 MG tablet, Take 1 tablet (10 mg total) by mouth daily as needed for erectile dysfunction. (Patient not taking: Reported on 06/20/2021), Disp: 30 tablet, Rfl: 3 ?  tamsulosin (FLOMAX) 0.4 MG CAPS capsule, Take 0.4 mg by mouth daily. (Patient not taking: Reported on 06/20/2021), Disp: , Rfl:  ? ?Allergies: No Known Allergies ? ?Past Medical History, Surgical history, Social history, and Family History were reviewed and updated. ? ?Review of Systems: ?Review of Systems  ?Constitutional: Negative.   ?HENT:  Negative.    ?Eyes: Negative.   ?Respiratory: Negative.    ?Cardiovascular: Negative.   ?  Gastrointestinal: Negative.   ?Endocrine: Positive for hot flashes.  ?Genitourinary: Negative.    ?Musculoskeletal: Negative.   ?Skin: Negative.   ?Neurological: Negative.   ?Hematological: Negative.   ?Psychiatric/Behavioral: Negative.    ? ?Physical Exam: ? height is 5' 10.5" (1.791 m) and weight is 237 lb 12.8 oz (107.9 kg). His oral temperature is 98.7 ?F (37.1 ?C). His blood pressure is 164/73 (abnormal) and his pulse is 59 (abnormal). His oxygen saturation is 99%.  ? ?Wt Readings from Last 3 Encounters:  ?06/20/21 237 lb 12.8 oz (107.9 kg)  ?05/02/21 240 lb (108.9 kg)  ?03/13/21 238 lb 4 oz (108.1 kg)  ? ? ?Physical Exam ?Vitals reviewed.  ?HENT:  ?   Head: Normocephalic and atraumatic.  ?Eyes:  ?   Pupils: Pupils are equal, round, and reactive to light.   ?Cardiovascular:  ?   Rate and Rhythm: Normal rate and regular rhythm.  ?   Heart sounds: Normal heart sounds.  ?Pulmonary:  ?   Effort: Pulmonary effort is normal.  ?   Breath sounds: Normal breath sounds.  ?Abdominal:  ?   General: Bowel sounds are normal.  ?   Palpations: Abdomen is soft.  ?Musculoskeletal:     ?   General: No tenderness or deformity. Normal range of motion.  ?   Cervical back: Normal range of motion.  ?Lymphadenopathy:  ?   Cervical: No cervical adenopathy.  ?Skin: ?   General: Skin is warm and dry.  ?   Findings: No erythema or rash.  ?Neurological:  ?   Mental Status: He is alert and oriented to person, place, and time.  ?Psychiatric:     ?   Behavior: Behavior normal.     ?   Thought Content: Thought content normal.     ?   Judgment: Judgment normal.  ? ? ? ?Lab Results  ?Component Value Date  ? WBC 8.1 06/20/2021  ? HGB 13.6 06/20/2021  ? HCT 40.3 06/20/2021  ? MCV 99.0 06/20/2021  ? PLT 174 06/20/2021  ? ?  Chemistry   ?   ?Component Value Date/Time  ? NA 140 05/02/2021 0745  ? K 3.6 05/02/2021 0745  ? CL 107 05/02/2021 0745  ? CO2 25 05/02/2021 0745  ? BUN 19 05/02/2021 0745  ? CREATININE 1.02 05/02/2021 0745  ?    ?Component Value Date/Time  ? CALCIUM 8.7 (L) 05/02/2021 0745  ? ALKPHOS 40 05/02/2021 0745  ? AST 20 05/02/2021 0745  ? ALT 22 05/02/2021 0745  ? BILITOT 0.6 05/02/2021 0745  ?  ? ? ?Impression and Plan: ?Mr. Leja is a very nice 76 year old African-American male.  He has castrate sensitive prostate cancer. ? ?I had to believe that he still is maintaining a good response with antitestosterone therapy. ? ?I we will still need to speak with Radiation Oncology.  We will do this at [facility].  I think this is be easier for Mr. Farney.  I am not sure if there is a role for radiation therapy.  Possibly there could be since his only active disease on the last scan was just in the prostate itself. ? ?He will get his Lupron and Xgeva today. ? ?We will plan to get him back to  see [place] in another 3 months. ? ? ?[name], MD ?4/19/20238:39 AM  ?

## 2021-06-20 NOTE — Addendum Note (Signed)
Addended by: Burney Gauze R on: 06/20/2021 02:32 PM ? ? Modules accepted: Orders ? ?

## 2021-06-20 NOTE — Patient Instructions (Signed)
Leuprolide depot injection ?What is this medication? ?LEUPROLIDE (loo PROE lide) is a man-made protein that acts like a natural hormone in the body. It decreases testosterone in men and decreases estrogen in women. In men, this medicine is used to treat advanced prostate cancer. In women, some forms of this medicine may be used to treat endometriosis, uterine fibroids, or other male hormone-related problems. ?This medicine may be used for other purposes; ask your health care provider or pharmacist if you have questions. ?COMMON BRAND NAME(S): Eligard, Fensolv, Lupron Depot, Lupron Depot-Ped, Viadur ?What should I tell my care team before I take this medication? ?They need to know if you have any of these conditions: ?diabetes ?heart disease or previous heart attack ?high blood pressure ?high cholesterol ?mental illness ?osteoporosis ?pain or difficulty passing urine ?seizures ?spinal cord metastasis ?stroke ?suicidal thoughts, plans, or attempt; a previous suicide attempt by you or a family member ?tobacco smoker ?unusual vaginal bleeding (women) ?an unusual or allergic reaction to leuprolide, benzyl alcohol, other medicines, foods, dyes, or preservatives ?pregnant or trying to get pregnant ?breast-feeding ?How should I use this medication? ?This medicine is for injection into a muscle or for injection under the skin. It is given by a health care professional in a hospital or clinic setting. The specific product will determine how it will be given to you. Make sure you understand which product you receive and how often you will receive it. ?Talk to your pediatrician regarding the use of this medicine in children. Special care may be needed. ?Overdosage: If you think you have taken too much of this medicine contact a poison control center or emergency room at once. ?NOTE: This medicine is only for you. Do not share this medicine with others. ?What if I miss a dose? ?It is important not to miss a dose. Call your  doctor or health care professional if you are unable to keep an appointment. ?Depot injections: Depot injections are given either once-monthly, every 12 weeks, every 16 weeks, or every 24 weeks depending on the product you are prescribed. The product you are prescribed will be based on if you are male or male, and your condition. Make sure you understand your product and dosing. ?What may interact with this medication? ?Do not take this medicine with any of the following medications: ?chasteberry ?cisapride ?dronedarone ?pimozide ?thioridazine ?This medicine may also interact with the following medications: ?herbal or dietary supplements, like black cohosh or DHEA ?male hormones, like estrogens or progestins and birth control pills, patches, rings, or injections ?male hormones, like testosterone ?other medicines that prolong the QT interval (abnormal heart rhythm) ?This list may not describe all possible interactions. Give your health care provider a list of all the medicines, herbs, non-prescription drugs, or dietary supplements you use. Also tell them if you smoke, drink alcohol, or use illegal drugs. Some items may interact with your medicine. ?What should I watch for while using this medication? ?Visit your doctor or health care professional for regular checks on your progress. During the first weeks of treatment, your symptoms may get worse, but then will improve as you continue your treatment. You may get hot flashes, increased bone pain, increased difficulty passing urine, or an aggravation of nerve symptoms. Discuss these effects with your doctor or health care professional, some of them may improve with continued use of this medicine. ?Male patients may experience a menstrual cycle or spotting during the first months of therapy with this medicine. If this continues, contact your doctor or   health care professional. ?This medicine may increase blood sugar. Ask your healthcare provider if changes in diet  or medicines are needed if you have diabetes. ?What side effects may I notice from receiving this medication? ?Side effects that you should report to your doctor or health care professional as soon as possible: ?allergic reactions like skin rash, itching or hives, swelling of the face, lips, or tongue ?breathing problems ?chest pain ?depression or memory disorders ?pain in your legs or groin ?pain at site where injected or implanted ?seizures ?severe headache ?signs and symptoms of high blood sugar such as being more thirsty or hungry or having to urinate more than normal. You may also feel very tired or have blurry vision ?swelling of the feet and legs ?suicidal thoughts or other mood changes ?visual changes ?vomiting ?Side effects that usually do not require medical attention (report to your doctor or health care professional if they continue or are bothersome): ?breast swelling or tenderness ?decrease in sex drive or performance ?diarrhea ?hot flashes ?loss of appetite ?muscle, joint, or bone pains ?nausea ?redness or irritation at site where injected or implanted ?skin problems or acne ?This list may not describe all possible side effects. Call your doctor for medical advice about side effects. You may report side effects to FDA at 1-800-FDA-1088. ?Where should I keep my medication? ?This drug is given in a hospital or clinic and will not be stored at home. ?NOTE: This sheet is a summary. It may not cover all possible information. If you have questions about this medicine, talk to your doctor, pharmacist, or health care provider. ?? 2023 Elsevier/Gold Standard (2021-01-19 00:00:00) ?Denosumab injection ?What is this medication? ?DENOSUMAB (den oh sue mab) slows bone breakdown. Prolia is used to treat osteoporosis in women after menopause and in men, and in people who are taking corticosteroids for 6 months or more. Xgeva is used to treat a high calcium level due to cancer and to prevent bone fractures and other  bone problems caused by multiple myeloma or cancer bone metastases. Xgeva is also used to treat giant cell tumor of the bone. ?This medicine may be used for other purposes; ask your health care provider or pharmacist if you have questions. ?COMMON BRAND NAME(S): Prolia, XGEVA ?What should I tell my care team before I take this medication? ?They need to know if you have any of these conditions: ?dental disease ?having surgery or tooth extraction ?infection ?kidney disease ?low levels of calcium or Vitamin D in the blood ?malnutrition ?on hemodialysis ?skin conditions or sensitivity ?thyroid or parathyroid disease ?an unusual reaction to denosumab, other medicines, foods, dyes, or preservatives ?pregnant or trying to get pregnant ?breast-feeding ?How should I use this medication? ?This medicine is for injection under the skin. It is given by a health care professional in a hospital or clinic setting. ?A special MedGuide will be given to you before each treatment. Be sure to read this information carefully each time. ?For Prolia, talk to your pediatrician regarding the use of this medicine in children. Special care may be needed. For Xgeva, talk to your pediatrician regarding the use of this medicine in children. While this drug may be prescribed for children as young as 13 years for selected conditions, precautions do apply. ?Overdosage: If you think you have taken too much of this medicine contact a poison control center or emergency room at once. ?NOTE: This medicine is only for you. Do not share this medicine with others. ?What if I miss a dose? ?It   is important not to miss your dose. Call your doctor or health care professional if you are unable to keep an appointment. ?What may interact with this medication? ?Do not take this medicine with any of the following medications: ?other medicines containing denosumab ?This medicine may also interact with the following medications: ?medicines that lower your chance of  fighting infection ?steroid medicines like prednisone or cortisone ?This list may not describe all possible interactions. Give your health care provider a list of all the medicines, herbs, non-prescriptio

## 2021-06-20 NOTE — Telephone Encounter (Signed)
Referral to RT manually faxed to Dr Malva Limes for RT appointment ?

## 2021-06-21 ENCOUNTER — Telehealth: Payer: Self-pay

## 2021-06-21 LAB — PSA, TOTAL AND FREE
PSA, Free Pct: UNDETERMINED %
PSA, Free: <0.02 ng/mL
Prostate Specific Ag, Serum: <0.1 ng/mL (ref 0.0–4.0)

## 2021-06-21 LAB — TESTOSTERONE: Testosterone: <3 ng/dL — ABNORMAL LOW (ref 264–916)

## 2021-06-21 NOTE — Telephone Encounter (Signed)
-----   Message from [name], MD sent at 06/21/2021  7:56 AM EDT ----- ?Call - the PSA is not detectable.  Pete ?

## 2021-06-21 NOTE — Telephone Encounter (Signed)
Left message for pt to call back regarding lab results. 

## 2021-06-21 NOTE — Telephone Encounter (Signed)
Called and informed patient of lab results, patient verbalized understanding and denies any questions or concerns at this time.   ? ? ?Pt wanted to let Dr.Ennever know he has a consultion for radonc May 3rd. MD notified.  ?

## 2021-09-19 ENCOUNTER — Inpatient Hospital Stay: Payer: 59

## 2021-09-19 ENCOUNTER — Inpatient Hospital Stay (HOSPITAL_BASED_OUTPATIENT_CLINIC_OR_DEPARTMENT_OTHER): Payer: 59 | Admitting: Hematology & Oncology

## 2021-09-19 ENCOUNTER — Inpatient Hospital Stay: Payer: 59 | Attending: Oncology

## 2021-09-19 ENCOUNTER — Other Ambulatory Visit: Payer: Self-pay | Admitting: *Deleted

## 2021-09-19 ENCOUNTER — Encounter: Payer: Self-pay | Admitting: Hematology & Oncology

## 2021-09-19 ENCOUNTER — Telehealth: Payer: Self-pay | Admitting: *Deleted

## 2021-09-19 VITALS — BP 139/68 | HR 58 | Temp 98.2°F | Resp 20 | Ht 70.5 in | Wt 238.8 lb

## 2021-09-19 DIAGNOSIS — C61 Malignant neoplasm of prostate: Secondary | ICD-10-CM | POA: Insufficient documentation

## 2021-09-19 DIAGNOSIS — C7951 Secondary malignant neoplasm of bone: Secondary | ICD-10-CM | POA: Diagnosis present

## 2021-09-19 DIAGNOSIS — Z79899 Other long term (current) drug therapy: Secondary | ICD-10-CM | POA: Insufficient documentation

## 2021-09-19 LAB — CBC WITH DIFFERENTIAL (CANCER CENTER ONLY)
Abs Immature Granulocytes: 0.01 10*3/uL (ref 0.00–0.07)
Basophils Absolute: 0 10*3/uL (ref 0.0–0.1)
Basophils Relative: 0 %
Eosinophils Absolute: 0.1 10*3/uL (ref 0.0–0.5)
Eosinophils Relative: 2 %
HCT: 37.4 % — ABNORMAL LOW (ref 39.0–52.0)
Hemoglobin: 12.7 g/dL — ABNORMAL LOW (ref 13.0–17.0)
Immature Granulocytes: 0 %
Lymphocytes Relative: 38 %
Lymphs Abs: 1.9 10*3/uL (ref 0.7–4.0)
MCH: 33.2 pg (ref 26.0–34.0)
MCHC: 34 g/dL (ref 30.0–36.0)
MCV: 97.9 fL (ref 80.0–100.0)
Monocytes Absolute: 0.6 10*3/uL (ref 0.1–1.0)
Monocytes Relative: 13 %
Neutro Abs: 2.3 10*3/uL (ref 1.7–7.7)
Neutrophils Relative %: 47 %
Platelet Count: 148 10*3/uL — ABNORMAL LOW (ref 150–400)
RBC: 3.82 MIL/uL — ABNORMAL LOW (ref 4.22–5.81)
RDW: 12.5 % (ref 11.5–15.5)
WBC Count: 4.8 10*3/uL (ref 4.0–10.5)
nRBC: 0 % (ref 0.0–0.2)

## 2021-09-19 LAB — CMP (CANCER CENTER ONLY)
ALT: 42 U/L (ref 0–44)
AST: 27 U/L (ref 15–41)
Albumin: 4.2 g/dL (ref 3.5–5.0)
Alkaline Phosphatase: 49 U/L (ref 38–126)
Anion gap: 8 (ref 5–15)
BUN: 17 mg/dL (ref 8–23)
CO2: 26 mmol/L (ref 22–32)
Calcium: 9.6 mg/dL (ref 8.9–10.3)
Chloride: 106 mmol/L (ref 98–111)
Creatinine: 1.02 mg/dL (ref 0.61–1.24)
GFR, Estimated: >60 mL/min (ref >60)
Glucose, Bld: 175 mg/dL — ABNORMAL HIGH (ref 70–99)
Potassium: 3.8 mmol/L (ref 3.5–5.1)
Sodium: 140 mmol/L (ref 135–145)
Total Bilirubin: 0.8 mg/dL (ref 0.3–1.2)
Total Protein: 7.2 g/dL (ref 6.5–8.1)

## 2021-09-19 MED ORDER — DENOSUMAB 120 MG/1.7ML ~~LOC~~ SOLN
120.0000 mg | Freq: Once | SUBCUTANEOUS | Status: AC
  Administered 2021-09-19: 120 mg via SUBCUTANEOUS
  Filled 2021-09-19: qty 1.7

## 2021-09-19 MED ORDER — LEUPROLIDE ACETATE (3 MONTH) 22.5 MG ~~LOC~~ KIT
22.5000 mg | PACK | Freq: Once | SUBCUTANEOUS | Status: AC
  Administered 2021-09-19: 22.5 mg via SUBCUTANEOUS
  Filled 2021-09-19: qty 22.5

## 2021-09-19 NOTE — Telephone Encounter (Signed)
As noted below by Dr. Marin Olp, I left a message for the patient informing him that the chemistry studies look OK. However, your blood sugar was quite high. Please watch your sugar intake because you don't want diabetes to become a real problem. I instructed him to call the office if he had any questions or concerns.

## 2021-09-19 NOTE — Telephone Encounter (Signed)
-----   Message from [name], MD sent at 09/19/2021  9:41 AM EDT ----- Call and let him know that the chemistry studies look okay although his blood sugar is quite high.  He really needs to watch out for his sugar intake.  He does not need to have diabetes become a real problem.

## 2021-09-19 NOTE — Patient Instructions (Signed)
Leuprolide depot injection What is this medication? LEUPROLIDE (loo PROE lide) is a man-made protein that acts like a natural hormone in the body. It decreases testosterone in men and decreases estrogen in women. In men, this medicine is used to treat advanced prostate cancer. In women, some forms of this medicine may be used to treat endometriosis, uterine fibroids, or other male hormone-related problems. This medicine may be used for other purposes; ask your health care provider or pharmacist if you have questions. COMMON BRAND NAME(S): Eligard, Fensolv, Lupron Depot, Lupron Depot-Ped, Viadur What should I tell my care team before I take this medication? They need to know if you have any of these conditions: diabetes heart disease or previous heart attack high blood pressure high cholesterol mental illness osteoporosis pain or difficulty passing urine seizures spinal cord metastasis stroke suicidal thoughts, plans, or attempt; a previous suicide attempt by you or a family member tobacco smoker unusual vaginal bleeding (women) an unusual or allergic reaction to leuprolide, benzyl alcohol, other medicines, foods, dyes, or preservatives pregnant or trying to get pregnant breast-feeding How should I use this medication? This medicine is for injection into a muscle or for injection under the skin. It is given by a health care professional in a hospital or clinic setting. The specific product will determine how it will be given to you. Make sure you understand which product you receive and how often you will receive it. Talk to your pediatrician regarding the use of this medicine in children. Special care may be needed. Overdosage: If you think you have taken too much of this medicine contact a poison control center or emergency room at once. NOTE: This medicine is only for you. Do not share this medicine with others. What if I miss a dose? It is important not to miss a dose. Call your  doctor or health care professional if you are unable to keep an appointment. Depot injections: Depot injections are given either once-monthly, every 12 weeks, every 16 weeks, or every 24 weeks depending on the product you are prescribed. The product you are prescribed will be based on if you are male or male, and your condition. Make sure you understand your product and dosing. What may interact with this medication? Do not take this medicine with any of the following medications: chasteberry cisapride dronedarone pimozide thioridazine This medicine may also interact with the following medications: herbal or dietary supplements, like black cohosh or DHEA male hormones, like estrogens or progestins and birth control pills, patches, rings, or injections male hormones, like testosterone other medicines that prolong the QT interval (abnormal heart rhythm) This list may not describe all possible interactions. Give your health care provider a list of all the medicines, herbs, non-prescription drugs, or dietary supplements you use. Also tell them if you smoke, drink alcohol, or use illegal drugs. Some items may interact with your medicine. What should I watch for while using this medication? Visit your doctor or health care professional for regular checks on your progress. During the first weeks of treatment, your symptoms may get worse, but then will improve as you continue your treatment. You may get hot flashes, increased bone pain, increased difficulty passing urine, or an aggravation of nerve symptoms. Discuss these effects with your doctor or health care professional, some of them may improve with continued use of this medicine. Male patients may experience a menstrual cycle or spotting during the first months of therapy with this medicine. If this continues, contact your doctor or  health care professional. This medicine may increase blood sugar. Ask your healthcare provider if changes in diet  or medicines are needed if you have diabetes. What side effects may I notice from receiving this medication? Side effects that you should report to your doctor or health care professional as soon as possible: allergic reactions like skin rash, itching or hives, swelling of the face, lips, or tongue breathing problems chest pain depression or memory disorders pain in your legs or groin pain at site where injected or implanted seizures severe headache signs and symptoms of high blood sugar such as being more thirsty or hungry or having to urinate more than normal. You may also feel very tired or have blurry vision swelling of the feet and legs suicidal thoughts or other mood changes visual changes vomiting Side effects that usually do not require medical attention (report to your doctor or health care professional if they continue or are bothersome): breast swelling or tenderness decrease in sex drive or performance diarrhea hot flashes loss of appetite muscle, joint, or bone pains nausea redness or irritation at site where injected or implanted skin problems or acne This list may not describe all possible side effects. Call your doctor for medical advice about side effects. You may report side effects to FDA at 1-800-FDA-1088. Where should I keep my medication? This drug is given in a hospital or clinic and will not be stored at home. NOTE: This sheet is a summary. It may not cover all possible information. If you have questions about this medicine, talk to your doctor, pharmacist, or health care provider.  2023 Elsevier/Gold Standard (2021-01-19 00:00:00) Denosumab injection What is this medication? DENOSUMAB (den oh sue mab) slows bone breakdown. Prolia is used to treat osteoporosis in women after menopause and in men, and in people who are taking corticosteroids for 6 months or more. Delton See is used to treat a high calcium level due to cancer and to prevent bone fractures and other  bone problems caused by multiple myeloma or cancer bone metastases. Delton See is also used to treat giant cell tumor of the bone. This medicine may be used for other purposes; ask your health care provider or pharmacist if you have questions. COMMON BRAND NAME(S): Prolia, XGEVA What should I tell my care team before I take this medication? They need to know if you have any of these conditions: dental disease having surgery or tooth extraction infection kidney disease low levels of calcium or Vitamin D in the blood malnutrition on hemodialysis skin conditions or sensitivity thyroid or parathyroid disease an unusual reaction to denosumab, other medicines, foods, dyes, or preservatives pregnant or trying to get pregnant breast-feeding How should I use this medication? This medicine is for injection under the skin. It is given by a health care professional in a hospital or clinic setting. A special MedGuide will be given to you before each treatment. Be sure to read this information carefully each time. For Prolia, talk to your pediatrician regarding the use of this medicine in children. Special care may be needed. For Delton See, talk to your pediatrician regarding the use of this medicine in children. While this drug may be prescribed for children as young as 13 years for selected conditions, precautions do apply. Overdosage: If you think you have taken too much of this medicine contact a poison control center or emergency room at once. NOTE: This medicine is only for you. Do not share this medicine with others. What if I miss a dose? It  is important not to miss your dose. Call your doctor or health care professional if you are unable to keep an appointment. What may interact with this medication? Do not take this medicine with any of the following medications: other medicines containing denosumab This medicine may also interact with the following medications: medicines that lower your chance of  fighting infection steroid medicines like prednisone or cortisone This list may not describe all possible interactions. Give your health care provider a list of all the medicines, herbs, non-prescription drugs, or dietary supplements you use. Also tell them if you smoke, drink alcohol, or use illegal drugs. Some items may interact with your medicine. What should I watch for while using this medication? Visit your doctor or health care professional for regular checks on your progress. Your doctor or health care professional may order blood tests and other tests to see how you are doing. Call your doctor or health care professional for advice if you get a fever, chills or sore throat, or other symptoms of a cold or flu. Do not treat yourself. This drug may decrease your body's ability to fight infection. Try to avoid being around people who are sick. You should make sure you get enough calcium and vitamin D while you are taking this medicine, unless your doctor tells you not to. Discuss the foods you eat and the vitamins you take with your health care professional. See your dentist regularly. Brush and floss your teeth as directed. Before you have any dental work done, tell your dentist you are receiving this medicine. Do not become pregnant while taking this medicine or for 5 months after stopping it. Talk with your doctor or health care professional about your birth control options while taking this medicine. Women should inform their doctor if they wish to become pregnant or think they might be pregnant. There is a potential for serious side effects to an unborn child. Talk to your health care professional or pharmacist for more information. What side effects may I notice from receiving this medication? Side effects that you should report to your doctor or health care professional as soon as possible: allergic reactions like skin rash, itching or hives, swelling of the face, lips, or tongue bone  pain breathing problems dizziness jaw pain, especially after dental work redness, blistering, peeling of the skin signs and symptoms of infection like fever or chills; cough; sore throat; pain or trouble passing urine signs of low calcium like fast heartbeat, muscle cramps or muscle pain; pain, tingling, numbness in the hands or feet; seizures unusual bleeding or bruising unusually weak or tired Side effects that usually do not require medical attention (report to your doctor or health care professional if they continue or are bothersome): constipation diarrhea headache joint pain loss of appetite muscle pain runny nose tiredness upset stomach This list may not describe all possible side effects. Call your doctor for medical advice about side effects. You may report side effects to FDA at 1-800-FDA-1088. Where should I keep my medication? This medicine is only given in a clinic, doctor's office, or other health care setting and will not be stored at home. NOTE: This sheet is a summary. It may not cover all possible information. If you have questions about this medicine, talk to your doctor, pharmacist, or health care provider.  2023 Elsevier/Gold Standard (2017-06-27 00:00:00)

## 2021-09-19 NOTE — Progress Notes (Signed)
Hematology and Oncology Follow Up Visit  Randall Carter [id number] 1945/03/10 76 y.o. 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 daily-start on 09/14/2020 -- changed to 500 mg po q day on 12/11/2020 Xgeva 120 mg IM q. 3 months-next dose on 12/2021 Radiation therapy to the prostate bed --3 can be completed on 09/21/2021     Interim History:  Randall Carter is back for his follow-up.  He is now getting radiation therapy to the prostate.  This was the only area that had activity on his last scan.  I thought be very reasonable to try to treat this area.   Overall, he has done well so far.  He is almost done with treatment.  I think he has 2 more treatments left.  He has had no problems with bowels or bladder.  He has had no bleeding.  He has had no diarrhea.  There has been no problems with pain.  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  He is doing well with the Zytiga.  He has had no problems with this medication.  He has had no leg swelling.  There is no cough.  There is no headache.  His last PSA was less than 0.1.  He has had a very very low testosterone level.  Overall, I was his performance status is ECOG 0.   Medications:  Current Outpatient Medications:    abiraterone acetate (ZYTIGA) 250 MG tablet, Take 2 tablets (500 mg total) by mouth daily. Take on an empty stomach 1 hour before or 2 hours after a meal, Disp: 60 tablet, Rfl: 6   Accu-Chek FastClix Lancets MISC, Use one needle daily, Disp: , Rfl:    albuterol (VENTOLIN HFA) 108 (90 Base) MCG/ACT inhaler, Inhale 2 puffs into the lungs every 6 (six) hours as needed., Disp: , Rfl:    aspirin 81 MG EC tablet, Take by mouth., Disp: , Rfl:    atenolol (TENORMIN) 50 MG tablet, Take 50 mg by mouth daily., Disp: , Rfl:    Cholecalciferol 125 MCG (5000 UT) TABS, Take by mouth., Disp: , Rfl:    fluticasone (FLONASE) 50  MCG/ACT nasal spray, Place 1 spray into both nostrils daily., Disp: , Rfl:    glucose blood test strip, Test glucose one time per day, Disp: , Rfl:    Loratadine 10 MG CAPS, Take 10 mg by mouth daily., Disp: , Rfl:    Multiple Vitamin (MULTI-VITAMIN) tablet, Take 1 tablet by mouth daily., Disp: , Rfl:    NIFEdipine (ADALAT CC) 30 MG 24 hr tablet, Take 30 mg by mouth daily., Disp: , Rfl:    predniSONE (DELTASONE) 5 MG tablet, TAKE 1 TABLET(5 MG) BY MOUTH DAILY WITH BREAKFAST, Disp: 30 tablet, Rfl: 6   simvastatin (ZOCOR) 40 MG tablet, Take 40 mg by mouth at bedtime., Disp: , Rfl:    tamsulosin (FLOMAX) 0.4 MG CAPS capsule, Take 0.4 mg by mouth daily., Disp: , Rfl:    vitamin B-12 (CYANOCOBALAMIN) 500 MCG tablet, Take by mouth., Disp: , Rfl:    ondansetron (ZOFRAN-ODT) 4 MG disintegrating tablet, Take 4 mg by mouth every 8 (eight) hours as needed. (Patient not taking: Reported on 06/20/2021), Disp: , Rfl:    tadalafil (CIALIS) 10 MG tablet, Take 1 tablet (10 mg total) by mouth daily as needed for erectile dysfunction. (Patient not taking: Reported on 06/20/2021), Disp: 30 tablet, Rfl: 3  No current facility-administered medications for this visit.  Facility-Administered Medications Ordered in Other Visits:    denosumab (XGEVA) injection 120 mg, 120 mg, Subcutaneous, Once, Mykel Sponaugle, Rudell Cobb, MD   Leuprolide Acetate (3 Month) (ELIGARD) 22.5 MG injection 22.5 mg, 22.5 mg, Subcutaneous, Once, Tajon Moring, Rudell Cobb, MD  Allergies: No Known Allergies  Past Medical History, Surgical history, Social history, and Family History were reviewed and updated.  Review of Systems: Review of Systems  Constitutional: Negative.   HENT:  Negative.    Eyes: Negative.   Respiratory: Negative.    Cardiovascular: Negative.   Gastrointestinal: Negative.   Endocrine: Positive for hot flashes.  Genitourinary: Negative.    Musculoskeletal: Negative.   Skin: Negative.   Neurological: Negative.   Hematological: Negative.    Psychiatric/Behavioral: Negative.      Physical Exam:  height is 5' 10.5" (1.791 m) and weight is 238 lb 12.8 oz (108.3 kg). His oral temperature is 98.2 F (36.8 C). His blood pressure is 139/68 and his pulse is 58 (abnormal). His respiration is 20 and oxygen saturation is 97%.   Wt Readings from Last 3 Encounters:  09/19/21 238 lb 12.8 oz (108.3 kg)  06/20/21 237 lb 12.8 oz (107.9 kg)  05/02/21 240 lb (108.9 kg)    Physical Exam Vitals reviewed.  HENT:     Head: Normocephalic and atraumatic.  Eyes:     Pupils: Pupils are equal, round, and reactive to light.  Cardiovascular:     Rate and Rhythm: Normal rate and regular rhythm.     Heart sounds: Normal heart sounds.  Pulmonary:     Effort: Pulmonary effort is normal.     Breath sounds: Normal breath sounds.  Abdominal:     General: Bowel sounds are normal.     Palpations: Abdomen is soft.  Musculoskeletal:        General: No tenderness or deformity. Normal range of motion.     Cervical back: Normal range of motion.  Lymphadenopathy:     Cervical: No cervical adenopathy.  Skin:    General: Skin is warm and dry.     Findings: No erythema or rash.  Neurological:     Mental Status: He is alert and oriented to person, place, and time.  Psychiatric:        Behavior: Behavior normal.        Thought Content: Thought content normal.        Judgment: Judgment normal.      Lab Results  Component Value Date   WBC 4.8 09/19/2021   HGB 12.7 (L) 09/19/2021   HCT 37.4 (L) 09/19/2021   MCV 97.9 09/19/2021   PLT 148 (L) 09/19/2021     Chemistry      Component Value Date/Time   NA 144 06/20/2021 0804   K 3.8 06/20/2021 0804   CL 109 06/20/2021 0804   CO2 27 06/20/2021 0804   BUN 20 06/20/2021 0804   CREATININE 1.15 06/20/2021 0804      Component Value Date/Time   CALCIUM 9.9 06/20/2021 0804   ALKPHOS 47 06/20/2021 0804   AST 24 06/20/2021 0804   ALT 32 06/20/2021 0804   BILITOT 0.8 06/20/2021 0804       Impression and Plan: Randall Carter is a very nice 76 year old African-American male.  He has castrate sensitive prostate cancer.  I think the radiation therapy is a great idea.  Hopefully, this will eliminate any residual active disease that he may have in the prostate area.  He  will get his Lupron today.  He will get his Delton See today.  I probably would not do any scans on him for another couple months or so.  We may think about getting another scan before we see him back to see how everything looks.  I am just happy that his quality of life is doing so well right now.  We will get him back in 3 months.   [name], MD 7/19/20238:20 AM

## 2021-09-20 LAB — PSA, TOTAL AND FREE
PSA, Free Pct: UNDETERMINED %
PSA, Free: <0.02 ng/mL
Prostate Specific Ag, Serum: <0.1 ng/mL (ref 0.0–4.0)

## 2021-09-20 LAB — TESTOSTERONE: Testosterone: <3 ng/dL — ABNORMAL LOW (ref 264–916)

## 2021-09-21 ENCOUNTER — Telehealth: Payer: Self-pay | Admitting: *Deleted

## 2021-09-21 NOTE — Telephone Encounter (Signed)
-----   Message from [name], MD sent at 09/20/2021  4:32 PM EDT ----- Call and let him know that the PSA is still not detectable.  This is fantastic.  Thanks.  Laurey Arrow

## 2021-09-21 NOTE — Telephone Encounter (Signed)
As noted below by Dr. Marin Olp, I left a message informing him that the PSA is still not detectable. This is fantastic! Instructed him to call the office if he has any questions or concerns.

## 2021-10-11 ENCOUNTER — Encounter (HOSPITAL_COMMUNITY)
Admission: RE | Admit: 2021-10-11 | Discharge: 2021-10-11 | Disposition: A | Discharge status: Home / Self Care | Payer: 59 | Source: Ambulatory Visit | Attending: Hematology & Oncology | Admitting: Hematology & Oncology

## 2021-10-11 DIAGNOSIS — C7951 Secondary malignant neoplasm of bone: Secondary | ICD-10-CM | POA: Diagnosis present

## 2021-10-11 DIAGNOSIS — C61 Malignant neoplasm of prostate: Secondary | ICD-10-CM | POA: Diagnosis present

## 2021-10-11 MED ORDER — PIFLIFOLASTAT F 18 (PYLARIFY) INJECTION
9.0000 | Freq: Once | INTRAVENOUS | Status: AC
  Administered 2021-10-11: 8 via INTRAVENOUS

## 2021-10-19 ENCOUNTER — Telehealth: Payer: Self-pay

## 2021-10-19 NOTE — Telephone Encounter (Signed)
-----   Message from [name], MD sent at 10/18/2021  1:09 PM EDT ----- Call and let him know that the PET scan does show some activity in the prostate but this may be reflective of radiation changes.  There is no evidence of active prostate cancer in his lymph nodes or bones.  Thanks.[name]

## 2021-10-19 NOTE — Telephone Encounter (Signed)
Called patient o let him know that the PET scan does show some activity in the prostate but this may be reflective of radiation changes.  There is no evidence of active prostate cancer in his lymph nodes or bones. Patient verbalized understanding and was thankful  for the information. Confirmed with him that he should keep his appointments with Dr. Marin Olp as scheduled, and to call us if he has any questions or concerns.

## 2021-10-22 ENCOUNTER — Other Ambulatory Visit: Payer: Self-pay | Admitting: Hematology & Oncology

## 2021-10-22 DIAGNOSIS — C61 Malignant neoplasm of prostate: Secondary | ICD-10-CM

## 2021-11-22 ENCOUNTER — Other Ambulatory Visit (INDEPENDENT_AMBULATORY_CARE_PROVIDER_SITE_OTHER): Payer: Medicare Other

## 2021-11-22 ENCOUNTER — Encounter: Payer: Self-pay | Admitting: Medical

## 2021-11-22 ENCOUNTER — Ambulatory Visit (INDEPENDENT_AMBULATORY_CARE_PROVIDER_SITE_OTHER): Payer: Medicare Other | Admitting: Medical

## 2021-11-22 VITALS — BP 143/65 | HR 56 | Temp 98.0°F | Resp 18 | Ht 70.0 in | Wt 232.8 lb

## 2021-11-22 DIAGNOSIS — E785 Hyperlipidemia, unspecified: Secondary | ICD-10-CM

## 2021-11-22 DIAGNOSIS — I10 Essential (primary) hypertension: Secondary | ICD-10-CM

## 2021-11-22 DIAGNOSIS — C61 Malignant neoplasm of prostate: Secondary | ICD-10-CM

## 2021-11-22 DIAGNOSIS — R739 Hyperglycemia, unspecified: Secondary | ICD-10-CM

## 2021-11-22 DIAGNOSIS — J4520 Mild intermittent asthma, uncomplicated: Secondary | ICD-10-CM | POA: Diagnosis not present

## 2021-11-22 DIAGNOSIS — J309 Allergic rhinitis, unspecified: Secondary | ICD-10-CM

## 2021-11-22 DIAGNOSIS — C7800 Secondary malignant neoplasm of unspecified lung: Secondary | ICD-10-CM

## 2021-11-22 LAB — COMPREHENSIVE METABOLIC PANEL
ALT: 30 U/L (ref 0–53)
AST: 21 U/L (ref 0–37)
Albumin: 4.1 g/dL (ref 3.5–5.2)
Alkaline Phosphatase: 53 U/L (ref 39–117)
BUN: 16 mg/dL (ref 6–23)
CO2: 24 mEq/L (ref 19–32)
Calcium: 9.2 mg/dL (ref 8.4–10.5)
Chloride: 105 mEq/L (ref 96–112)
Creatinine, Ser: 0.95 mg/dL (ref 0.40–1.50)
GFR: 78.79 mL/min (ref >60.00)
Glucose, Bld: 159 mg/dL — ABNORMAL HIGH (ref 70–99)
Potassium: 4.1 mEq/L (ref 3.5–5.1)
Sodium: 139 mEq/L (ref 135–145)
Total Bilirubin: 0.7 mg/dL (ref 0.2–1.2)
Total Protein: 7.5 g/dL (ref 6.0–8.3)

## 2021-11-22 LAB — LIPID PANEL
Cholesterol: 181 mg/dL (ref 0–200)
HDL: 46.3 mg/dL (ref >39.00)
LDL Cholesterol: 114 mg/dL — ABNORMAL HIGH (ref 0–99)
NonHDL: 134.85
Total CHOL/HDL Ratio: 4
Triglycerides: 103 mg/dL (ref 0.0–149.0)
VLDL: 20.6 mg/dL (ref 0.0–40.0)

## 2021-11-22 MED ORDER — ATENOLOL 50 MG PO TABS: 50.0000 mg | ORAL_TABLET | Freq: Every day | ORAL | 3 refills | Status: DC

## 2021-11-22 MED ORDER — ALBUTEROL SULFATE HFA 108 (90 BASE) MCG/ACT IN AERS: 2.0000 | INHALATION_SPRAY | Freq: Four times a day (QID) | RESPIRATORY_TRACT | 2 refills | Status: AC | PRN

## 2021-11-22 MED ORDER — NIFEDIPINE ER 30 MG PO TB24: 30.0000 mg | ORAL_TABLET | Freq: Every day | ORAL | 3 refills | Status: DC

## 2021-11-22 NOTE — Patient Instructions (Addendum)
Nice to meet you,  For prostate CA continue to follow up with Dr. Marin Olp.  Htn- bp usually controlled per your report. Your bp is high and first visit with [place]. Ask you check bp at home later today when relaxed and send me update. Want bp to be less than 130/80 ideally. Continue tenormin.  High cholesterol- continue simvastatin.  Intermittent controlled asthma- continue rare sporadic use of albuterol. Let us know if needing more frequently.  For allergies- continue current flonase and allegra.  For elevated sugar will get cmp and a1c. Continue low sugar diet.  Follow up date to be determined after lab review and blood pressure update.

## 2021-11-22 NOTE — Progress Notes (Signed)
Subjective:    Patient ID: Randall Carter, male    DOB: 1945-04-18, 76 y.o.   MRN: [id number]  HPI  Pt in for first time.  He needs new pcp.  Pt worked for [employer] in past. Pt states tries to walk daily. Married. Eating healthy. Nonsmoker. No alcohol.    Pt states told prediabetic in past. Trying to prevent. No recent a1c done. He checks sugars fasting in morning. Often sugars in 140 range.  Pt uses accuck meter. Has 25 strips left.   Pt has prostate Cancer. He sees Dr Marin Olp.  Hyperlipidemia- on simvastatin.   Allergies- spring and fall. Takes flonase as needed. On allergra daily.  Htn- tenormin 50 mg daily and nifedipine cc 30 mg daily.   Asthma- intermittent flares. Estimates maybe once a month use.   Review of Systems  Constitutional:  Negative for chills, fatigue and fever.  Respiratory:  Negative for cough, chest tightness, shortness of breath and wheezing.   Cardiovascular:  Negative for chest pain and palpitations.  Gastrointestinal:  Negative for abdominal pain, diarrhea, nausea and vomiting.  Genitourinary:  Negative for dysuria, enuresis, flank pain and hematuria.  Musculoskeletal:  Negative for back pain, joint swelling, neck pain and neck stiffness.  Skin:  Negative for rash.  Neurological:  Negative for dizziness, light-headedness, numbness and headaches.  Hematological:  Negative for adenopathy. Does not bruise/bleed easily.  Psychiatric/Behavioral:  Negative for behavioral problems, decreased concentration and hallucinations. The patient is not nervous/anxious.    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     Social History   Socioeconomic History   Marital status: Widowed    Spouse name: Not on file   Number of children: Not on file   Years of education: Not on file   Highest education level: Not  on file  Occupational History   Not on file  Tobacco Use   Smoking status: Never    Passive exposure: Never   Smokeless tobacco: Never  Vaping Use   Vaping Use: Never used  Substance and Sexual Activity   Alcohol use: Not Currently   Drug use: Not Currently   Sexual activity: Not Currently  Other Topics Concern   Not on file  Social History Narrative   Not on file   Social Determinants of Health   Financial Resource Strain: Not on file  Food Insecurity: Not on file  Transportation Needs: Not on file  Physical Activity: Not on file  Stress: Not on file  Social Connections: Not on file  Intimate Partner Violence: Not on file    No past surgical history on file.  No family history on file.  No Known Allergies  Current Outpatient Medications on File Prior to Visit  Medication Sig Dispense Refill   abiraterone acetate (ZYTIGA) 250 MG tablet Take 2 tablets (500 mg total) by mouth daily. Take on an empty stomach 1 hour before or 2 hours after a meal 60 tablet 6   Accu-Chek FastClix Lancets MISC Use one needle daily     albuterol (VENTOLIN HFA) 108 (90 Base) MCG/ACT inhaler Inhale 2 puffs into the lungs every 6 (six) hours as needed.     aspirin 81 MG EC tablet Take by mouth.     atenolol (TENORMIN) 50 MG tablet Take 50 mg by mouth daily.     Cholecalciferol 125 MCG (5000  UT) TABS Take by mouth.     fluticasone (FLONASE) 50 MCG/ACT nasal spray Place 1 spray into both nostrils daily.     glucose blood test strip Test glucose one time per day     Loratadine 10 MG CAPS Take 10 mg by mouth daily.     Multiple Vitamin (MULTI-VITAMIN) tablet Take 1 tablet by mouth daily.     NIFEdipine (ADALAT CC) 30 MG 24 hr tablet Take 30 mg by mouth daily.     ondansetron (ZOFRAN-ODT) 4 MG disintegrating tablet Take 4 mg by mouth every 8 (eight) hours as needed.     predniSONE (DELTASONE) 5 MG tablet TAKE 1 TABLET(5 MG) BY MOUTH DAILY WITH BREAKFAST 30 tablet 6   simvastatin (ZOCOR) 40 MG  tablet Take 40 mg by mouth at bedtime.     tadalafil (CIALIS) 10 MG tablet Take 1 tablet (10 mg total) by mouth daily as needed for erectile dysfunction. 30 tablet 3   tamsulosin (FLOMAX) 0.4 MG CAPS capsule Take 0.4 mg by mouth daily.     vitamin B-12 (CYANOCOBALAMIN) 500 MCG tablet Take by mouth.     No current facility-administered medications on file prior to visit.    BP (!) 143/65   Pulse (!) 56   Temp 98 F (36.7 C)   Resp 18   Ht '5\' 10"'$  (1.778 m)   Wt 232 lb 12.8 oz (105.6 kg)   SpO2 100%   BMI 33.40 kg/m          Objective:   Physical Exam  General Mental Status- Alert. General Appearance- Not in acute distress.   Skin General: Color- Normal Color. Moisture- Normal Moisture.  Neck Carotid Arteries- Normal color. Moisture- Normal Moisture. No carotid bruits. No JVD.  Chest and Lung Exam Auscultation: Breath Sounds:-Normal.  Cardiovascular Auscultation:Rythm- Regular. Murmurs & Other Heart Sounds:Auscultation of the heart reveals- No Murmurs.  Abdomen Inspection:-Inspeection Normal. Palpation/Percussion:Note:No mass. Palpation and Percussion of the abdomen reveal- Non Tender, Non Distended + BS, no rebound or guarding.   Neurologic Cranial Nerve exam:- CN III-XII intact(No nystagmus), symmetric smile. Strength:- 5/5 equal and symmetric strength both upper and lower extremities.       Assessment & Plan:   Patient Instructions  Nice to meet you,  For prostate CA continue to follow up with Dr. Marin Olp.  Htn- bp usually controlled per your report. Your bp is high and first visit with [place]. Ask you check bp at home later today when relaxed and send me update. Want bp to be less than 130/80 ideally. Continue tenormin.  High cholesterol- continue simavastin.  Intermittent controlled asthma- continue rare sporadic use of albuterol. Let us know if needing more frequently.  For allergies- continue current flonase and allegra.  For elevated sugar will get  cmp and a1c. Continue low sugar diet.  Follow up date to be determined after lab review and blood pressure update.     Mackie Pai, PA-C

## 2021-11-23 ENCOUNTER — Telehealth: Payer: Self-pay | Admitting: Medical

## 2021-11-23 NOTE — Telephone Encounter (Signed)
Patient called to follow up on labs, s/w [place]. Per CMA, advised labs were normal. Patient stated Randall Carter wanted to know what his blood pressure readings were.   9.21.23: 151/80  9.22.23: 152 or 153/81  Please call to advise if current prescription of atenolol needs to be changed.

## 2021-11-24 MED ORDER — NIFEDIPINE ER 60 MG PO TB24
60.0000 mg | ORAL_TABLET | Freq: Every day | ORAL | 3 refills | Status: DC
Start: 2021-11-24 — End: 2022-04-23

## 2021-11-24 NOTE — Addendum Note (Signed)
Addended by: Anabel Halon on: 11/24/2021 03:54 AM   Modules accepted: Orders

## 2021-11-26 LAB — HEMOGLOBIN A1C: Hgb A1c MFr Bld: 8.3 % — ABNORMAL HIGH (ref 4.6–6.5)

## 2021-11-26 NOTE — Telephone Encounter (Signed)
NV made

## 2021-11-26 NOTE — Telephone Encounter (Signed)
Pt called notified

## 2021-11-27 MED ORDER — METFORMIN HCL 500 MG PO TABS: 500.0000 mg | ORAL_TABLET | Freq: Two times a day (BID) | ORAL | 3 refills | Status: DC

## 2021-11-27 NOTE — Addendum Note (Signed)
Addended by: Anabel Halon on: 11/27/2021 08:14 AM   Modules accepted: Orders

## 2021-11-28 MED ORDER — SITAGLIPTIN PHOSPHATE 25 MG PO TABS: 25.0000 mg | ORAL_TABLET | Freq: Every day | ORAL | 5 refills | Status: DC

## 2021-11-28 NOTE — Addendum Note (Signed)
Addended by: Anabel Halon on: 11/28/2021 07:54 AM   Modules accepted: Orders

## 2021-12-14 ENCOUNTER — Ambulatory Visit (INDEPENDENT_AMBULATORY_CARE_PROVIDER_SITE_OTHER): Payer: Medicare Other | Admitting: Medical

## 2021-12-14 VITALS — BP 122/84 | HR 53

## 2021-12-14 DIAGNOSIS — I10 Essential (primary) hypertension: Secondary | ICD-10-CM

## 2021-12-14 NOTE — Progress Notes (Unsigned)
Pt here for Blood pressure check per Mackie Pai PA-C  Pt currently takes:nifedipine to 60 mg Atenolol 50 MG   Pt reports compliance with medication.  BP Readings from Last 3 Encounters:  11/22/21 (!) 143/65  09/19/21 139/68  06/20/21 (!) 164/73   Pt has been checking BP at home with average readings of 116/65 per Pt .  He has stop taking his metformin due to diarrhea and he does not take Januvia due to price.  He has been making some lifestyle changes like diet changes and exercising.  Also patient has went down in weight and now is at 237lb  BP today @ = 122/84 HR = 53  Pt advised per Mackie Pai PA-C that blood pressure is good today and to follow up in 3 weeks.  Bring in log of blood pressures and blood sugars.  Patient will call back to make appointment for follow up in 3 weeks.  Mackie Pai, PA-C

## 2021-12-20 ENCOUNTER — Encounter: Payer: Self-pay | Admitting: Hematology & Oncology

## 2021-12-20 ENCOUNTER — Other Ambulatory Visit: Payer: Self-pay

## 2021-12-20 ENCOUNTER — Inpatient Hospital Stay: Payer: 59 | Attending: Oncology

## 2021-12-20 ENCOUNTER — Inpatient Hospital Stay: Payer: 59

## 2021-12-20 ENCOUNTER — Inpatient Hospital Stay (HOSPITAL_BASED_OUTPATIENT_CLINIC_OR_DEPARTMENT_OTHER): Payer: 59 | Admitting: Hematology & Oncology

## 2021-12-20 VITALS — BP 165/76 | HR 55 | Temp 98.5°F | Resp 18 | Ht 70.5 in | Wt 236.0 lb

## 2021-12-20 DIAGNOSIS — C7800 Secondary malignant neoplasm of unspecified lung: Secondary | ICD-10-CM

## 2021-12-20 DIAGNOSIS — Z79899 Other long term (current) drug therapy: Secondary | ICD-10-CM | POA: Diagnosis not present

## 2021-12-20 DIAGNOSIS — R232 Flushing: Secondary | ICD-10-CM | POA: Insufficient documentation

## 2021-12-20 DIAGNOSIS — C61 Malignant neoplasm of prostate: Secondary | ICD-10-CM | POA: Diagnosis present

## 2021-12-20 DIAGNOSIS — Z923 Personal history of irradiation: Secondary | ICD-10-CM | POA: Insufficient documentation

## 2021-12-20 DIAGNOSIS — C7951 Secondary malignant neoplasm of bone: Secondary | ICD-10-CM | POA: Diagnosis present

## 2021-12-20 LAB — CBC WITH DIFFERENTIAL (CANCER CENTER ONLY)
Abs Immature Granulocytes: 0.02 10*3/uL (ref 0.00–0.07)
Basophils Absolute: 0 10*3/uL (ref 0.0–0.1)
Basophils Relative: 0 %
Eosinophils Absolute: 0.1 10*3/uL (ref 0.0–0.5)
Eosinophils Relative: 2 %
HCT: 37.5 % — ABNORMAL LOW (ref 39.0–52.0)
Hemoglobin: 12.7 g/dL — ABNORMAL LOW (ref 13.0–17.0)
Immature Granulocytes: 0 %
Lymphocytes Relative: 29 %
Lymphs Abs: 1.9 10*3/uL (ref 0.7–4.0)
MCH: 33.6 pg (ref 26.0–34.0)
MCHC: 33.9 g/dL (ref 30.0–36.0)
MCV: 99.2 fL (ref 80.0–100.0)
Monocytes Absolute: 0.7 10*3/uL (ref 0.1–1.0)
Monocytes Relative: 10 %
Neutro Abs: 3.7 10*3/uL (ref 1.7–7.7)
Neutrophils Relative %: 59 %
Platelet Count: 156 10*3/uL (ref 150–400)
RBC: 3.78 MIL/uL — ABNORMAL LOW (ref 4.22–5.81)
RDW: 12.3 % (ref 11.5–15.5)
WBC Count: 6.5 10*3/uL (ref 4.0–10.5)
nRBC: 0 % (ref 0.0–0.2)

## 2021-12-20 LAB — CMP (CANCER CENTER ONLY)
ALT: 25 U/L (ref 0–44)
AST: 24 U/L (ref 15–41)
Albumin: 4.4 g/dL (ref 3.5–5.0)
Alkaline Phosphatase: 46 U/L (ref 38–126)
Anion gap: 8 (ref 5–15)
BUN: 23 mg/dL (ref 8–23)
CO2: 26 mmol/L (ref 22–32)
Calcium: 9.6 mg/dL (ref 8.9–10.3)
Chloride: 108 mmol/L (ref 98–111)
Creatinine: 1.08 mg/dL (ref 0.61–1.24)
GFR, Estimated: >60 mL/min (ref >60)
Glucose, Bld: 149 mg/dL — ABNORMAL HIGH (ref 70–99)
Potassium: 4.2 mmol/L (ref 3.5–5.1)
Sodium: 142 mmol/L (ref 135–145)
Total Bilirubin: 0.7 mg/dL (ref 0.3–1.2)
Total Protein: 7.6 g/dL (ref 6.5–8.1)

## 2021-12-20 MED ORDER — DENOSUMAB 120 MG/1.7ML ~~LOC~~ SOLN
120.0000 mg | Freq: Once | SUBCUTANEOUS | Status: AC
  Administered 2021-12-20: 120 mg via SUBCUTANEOUS
  Filled 2021-12-20: qty 1.7

## 2021-12-20 MED ORDER — LEUPROLIDE ACETATE (3 MONTH) 22.5 MG ~~LOC~~ KIT
22.5000 mg | PACK | Freq: Once | SUBCUTANEOUS | Status: DC
  Filled 2021-12-20: qty 22.5

## 2021-12-20 NOTE — Progress Notes (Signed)
Eligard syringe malfunctioned allowing medicine to seep from syringe without being mixed. Asked pharmacy for a new Eligard but did not have one available. Patient's Eligard rescheduled to Monday. Delton See given as planned for today.

## 2021-12-20 NOTE — Progress Notes (Signed)
Hematology and Oncology Follow Up Visit  AYDN FERRARA [id number] 26-Mar-1945 76 y.o. 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 daily-start on 09/14/2020 -- changed to 500 mg po q day on 12/11/2020 Xgeva 120 mg IM q. 3 months-next dose on 03/2022  Radiation therapy to the prostate bed -- completed on 09/21/2021     Interim History:  Mr. Lagman is back for his follow-up.  He is looking quite good.  He has 1 year anniversary last week.  He and his wife celebrated by going out to dinner.  I am so happy for him.  He still has some hot flashes and sweats.  This is from the testosterone depletion.  He completed radiation therapy to the prostate bed.  This was back in July 2023.  He has had no problems with pain.  There is no cough or shortness of breath.  He has had no nausea or vomiting.  There is no change in bowel or bladder habits.  He has had no bleeding.  When we last saw him, his PSA was less than 0.1.  The testosterone level was less than 3.  He has had no issues with fever.  He has had no rashes.  Overall, I would say his performance status is probably ECOG 0.     Medications:  Current Outpatient Medications:    abiraterone acetate (ZYTIGA) 250 MG tablet, Take 2 tablets (500 mg total) by mouth daily. Take on an empty stomach 1 hour before or 2 hours after a meal, Disp: 60 tablet, Rfl: 6   Accu-Chek FastClix Lancets MISC, Use one needle daily, Disp: , Rfl:    albuterol (VENTOLIN HFA) 108 (90 Base) MCG/ACT inhaler, Inhale 2 puffs into the lungs every 6 (six) hours as needed., Disp: 18 g, Rfl: 2   aspirin 81 MG EC tablet, Take by mouth., Disp: , Rfl:    atenolol (TENORMIN) 50 MG tablet, Take 1 tablet (50 mg total) by mouth daily., Disp: 90 tablet, Rfl: 3   Cholecalciferol 125 MCG (5000 UT) TABS, Take by mouth., Disp: , Rfl:    fluticasone  (FLONASE) 50 MCG/ACT nasal spray, Place 1 spray into both nostrils daily., Disp: , Rfl:    glucose blood test strip, Test glucose one time per day, Disp: , Rfl:    Loratadine 10 MG CAPS, Take 10 mg by mouth daily., Disp: , Rfl:    Multiple Vitamin (MULTI-VITAMIN) tablet, Take 1 tablet by mouth daily., Disp: , Rfl:    NIFEdipine (ADALAT CC) 60 MG 24 hr tablet, Take 1 tablet (60 mg total) by mouth daily., Disp: 30 tablet, Rfl: 3   ondansetron (ZOFRAN-ODT) 4 MG disintegrating tablet, Take 4 mg by mouth every 8 (eight) hours as needed., Disp: , Rfl:    predniSONE (DELTASONE) 5 MG tablet, TAKE 1 TABLET(5 MG) BY MOUTH DAILY WITH BREAKFAST, Disp: 30 tablet, Rfl: 6   simvastatin (ZOCOR) 40 MG tablet, Take 40 mg by mouth at bedtime., Disp: , Rfl:    sitaGLIPtin (JANUVIA) 25 MG tablet, Take 1 tablet (25 mg total) by mouth daily., Disp: 30 tablet, Rfl: 5   tadalafil (CIALIS) 10 MG tablet, Take 1 tablet (10 mg total) by mouth daily as needed for erectile dysfunction., Disp: 30 tablet, Rfl: 3   tamsulosin (FLOMAX) 0.4 MG CAPS capsule, Take 0.4 mg by mouth daily., Disp: , Rfl:  vitamin B-12 (CYANOCOBALAMIN) 500 MCG tablet, Take by mouth., Disp: , Rfl:   Allergies:  Allergies  Allergen Reactions   Metformin And Related Diarrhea and Nausea And Vomiting   Tamsulosin Other (See Comments)    Past Medical History, Surgical history, Social history, and Family History were reviewed and updated.  Review of Systems: Review of Systems  Constitutional: Negative.   HENT:  Negative.    Eyes: Negative.   Respiratory: Negative.    Cardiovascular: Negative.   Gastrointestinal: Negative.   Endocrine: Positive for hot flashes.  Genitourinary: Negative.    Musculoskeletal: Negative.   Skin: Negative.   Neurological: Negative.   Hematological: Negative.   Psychiatric/Behavioral: Negative.      Physical Exam:  height is 5' 10.5" (1.791 m) and weight is 236 lb (107 kg). His oral temperature is 98.5 F (36.9  C). His blood pressure is 165/76 (abnormal) and his pulse is 55 (abnormal). His respiration is 18 and oxygen saturation is 99%.   Wt Readings from Last 3 Encounters:  12/20/21 236 lb (107 kg)  11/22/21 232 lb 12.8 oz (105.6 kg)  09/19/21 238 lb 12.8 oz (108.3 kg)    Physical Exam Vitals reviewed.  HENT:     Head: Normocephalic and atraumatic.  Eyes:     Pupils: Pupils are equal, round, and reactive to light.  Cardiovascular:     Rate and Rhythm: Normal rate and regular rhythm.     Heart sounds: Normal heart sounds.  Pulmonary:     Effort: Pulmonary effort is normal.     Breath sounds: Normal breath sounds.  Abdominal:     General: Bowel sounds are normal.     Palpations: Abdomen is soft.  Musculoskeletal:        General: No tenderness or deformity. Normal range of motion.     Cervical back: Normal range of motion.  Lymphadenopathy:     Cervical: No cervical adenopathy.  Skin:    General: Skin is warm and dry.     Findings: No erythema or rash.  Neurological:     Mental Status: He is alert and oriented to person, place, and time.  Psychiatric:        Behavior: Behavior normal.        Thought Content: Thought content normal.        Judgment: Judgment normal.      Lab Results  Component Value Date   WBC 6.5 12/20/2021   HGB 12.7 (L) 12/20/2021   HCT 37.5 (L) 12/20/2021   MCV 99.2 12/20/2021   PLT 156 12/20/2021     Chemistry      Component Value Date/Time   NA 142 12/20/2021 0816   K 4.2 12/20/2021 0816   CL 108 12/20/2021 0816   CO2 26 12/20/2021 0816   BUN 23 12/20/2021 0816   CREATININE 1.08 12/20/2021 0816      Component Value Date/Time   CALCIUM 9.6 12/20/2021 0816   ALKPHOS 46 12/20/2021 0816   AST 24 12/20/2021 0816   ALT 25 12/20/2021 0816   BILITOT 0.7 12/20/2021 0816      Impression and Plan: Mr. Dini is a very nice 76 year old African-American male.  He has castrate sensitive prostate cancer.  At this point, we will set him up with a  PSMA scan.  This will tell us how things went with radiation therapy.  We will get this in December.  He will get his Lupron and Xgeva today.  We will have him come back in  3 months for his next appointment and injections.  Hopefully, we will see that he will stay in remission or at least have a very low level of prostate cancer for quite a while.    [name], MD 10/19/20238:57 AM

## 2021-12-21 ENCOUNTER — Telehealth: Payer: Self-pay

## 2021-12-21 LAB — PSA, TOTAL AND FREE
PSA, Free Pct: UNDETERMINED %
PSA, Free: <0.02 ng/mL
Prostate Specific Ag, Serum: <0.1 ng/mL (ref 0.0–4.0)

## 2021-12-21 LAB — TESTOSTERONE: Testosterone: <3 ng/dL — ABNORMAL LOW (ref 264–916)

## 2021-12-21 NOTE — Telephone Encounter (Signed)
Called and informed patient of lab results, patient verbalized understanding and denies any questions or concerns at this time.   

## 2021-12-21 NOTE — Telephone Encounter (Signed)
-----   Message from [name], MD sent at 12/21/2021 12:35 PM EDT ----- Call him and let him know that the PSA still is not detectable.  Laurey Arrow

## 2021-12-22 ENCOUNTER — Other Ambulatory Visit: Payer: Self-pay | Admitting: Hematology & Oncology

## 2021-12-22 DIAGNOSIS — C7951 Secondary malignant neoplasm of bone: Secondary | ICD-10-CM

## 2021-12-24 ENCOUNTER — Inpatient Hospital Stay: Payer: 59

## 2021-12-24 VITALS — BP 138/64 | HR 53 | Temp 98.1°F | Resp 17

## 2021-12-24 DIAGNOSIS — C7951 Secondary malignant neoplasm of bone: Secondary | ICD-10-CM

## 2021-12-24 DIAGNOSIS — C61 Malignant neoplasm of prostate: Secondary | ICD-10-CM

## 2021-12-24 MED ORDER — LEUPROLIDE ACETATE (3 MONTH) 22.5 MG ~~LOC~~ KIT
22.5000 mg | PACK | Freq: Once | SUBCUTANEOUS | Status: AC
  Administered 2021-12-24: 22.5 mg via SUBCUTANEOUS
  Filled 2021-12-24: qty 22.5

## 2021-12-24 NOTE — Patient Instructions (Signed)
Leuprolide Suspension for Injection (Prostate Cancer) What is this medication? LEUPROLIDE (loo PROE lide) reduces the symptoms of prostate cancer. It works by decreasing levels of the hormone testosterone in the body. This prevents prostate cancer cells from spreading or growing. This medicine may be used for other purposes; ask your health care provider or pharmacist if you have questions. COMMON BRAND NAME(S): Eligard, Fensolvi, Lupron Depot, Lupron Depot-Ped, Lutrate Depot, Viadur What should I tell my care team before I take this medication? They need to know if you have any of these conditions: Diabetes Heart disease Heart failure High or low levels of electrolytes, such as magnesium, potassium, or sodium in your blood Irregular heartbeat or rhythm Seizures An unusual or allergic reaction to leuprolide, other medications, foods, dyes, or preservatives Pregnant or trying to get pregnant Breast-feeding How should I use this medication? This medication is injected under the skin or into a muscle. It is given by your care team in a hospital or clinic setting. Talk to your care team about the use of this medication in children. Special care may be needed. Overdosage: If you think you have taken too much of this medicine contact a poison control center or emergency room at once. NOTE: This medicine is only for you. Do not share this medicine with others. What if I miss a dose? Keep appointments for follow-up doses. It is important not to miss your dose. Call your care team if you are unable to keep an appointment. What may interact with this medication? Do not take this medication with any of the following: Cisapride Dronedarone Ketoconazole Levoketoconazole Pimozide Thioridazine This medication may also interact with the following: Other medications that cause heart rhythm changes This list may not describe all possible interactions. Give your health care provider a list of all the  medicines, herbs, non-prescription drugs, or dietary supplements you use. Also tell them if you smoke, drink alcohol, or use illegal drugs. Some items may interact with your medicine. What should I watch for while using this medication? Visit your care team for regular checks on your progress. Tell your care team if your symptoms do not start to get better or if they get worse. This medication may increase blood sugar. The risk may be higher in patients who already have diabetes. Ask your care team what you can do to lower the risk of diabetes while taking this medication. This medication may cause infertility. Talk to your care team if you are concerned about your fertility. Heart attacks and strokes have been reported with the use of this medication. Get emergency help if you develop signs or symptoms of a heart attack or stroke. Talk to your care team about the risks and benefits of this medication. What side effects may I notice from receiving this medication? Side effects that you should report to your care team as soon as possible: Allergic reactions--skin rash, itching, hives, swelling of the face, lips, tongue, or throat Heart attack--pain or tightness in the chest, shoulders, arms, or jaw, nausea, shortness of breath, cold or clammy skin, feeling faint or lightheaded Heart rhythm changes--fast or irregular heartbeat, dizziness, feeling faint or lightheaded, chest pain, trouble breathing High blood sugar (hyperglycemia)--increased thirst or amount of urine, unusual weakness or fatigue, blurry vision Mood swings, irritability, hostility Seizures Stroke--sudden numbness or weakness of the face, arm, or leg, trouble speaking, confusion, trouble walking, loss of balance or coordination, dizziness, severe headache, change in vision Thoughts of suicide or self-harm, worsening mood, feelings of depression  Side effects that usually do not require medical attention (report to your care team if they  continue or are bothersome): Bone pain Change in sex drive or performance General discomfort and fatigue Hot flashes Muscle pain Pain, redness, or irritation at injection site Swelling of the ankles, hands, or feet This list may not describe all possible side effects. Call your doctor for medical advice about side effects. You may report side effects to FDA at 1-800-FDA-1088. Where should I keep my medication? This medication is given in a hospital or clinic. It will not be stored at home. NOTE: This sheet is a summary. It may not cover all possible information. If you have questions about this medicine, talk to your doctor, pharmacist, or health care provider.  2023 Elsevier/Gold Standard (2021-04-30 00:00:00)

## 2022-02-12 ENCOUNTER — Ambulatory Visit: Payer: 59 | Admitting: Medical

## 2022-02-12 ENCOUNTER — Ambulatory Visit (HOSPITAL_BASED_OUTPATIENT_CLINIC_OR_DEPARTMENT_OTHER)
Admission: RE | Admit: 2022-02-12 | Discharge: 2022-02-12 | Disposition: A | Discharge status: Home / Self Care | Payer: Medicare Other | Source: Ambulatory Visit | Attending: Medical | Admitting: Medical

## 2022-02-12 ENCOUNTER — Encounter (HOSPITAL_BASED_OUTPATIENT_CLINIC_OR_DEPARTMENT_OTHER): Payer: Self-pay

## 2022-02-12 ENCOUNTER — Ambulatory Visit (HOSPITAL_BASED_OUTPATIENT_CLINIC_OR_DEPARTMENT_OTHER): Admission: RE | Admit: 2022-02-12 | Payer: Medicare Other | Source: Ambulatory Visit

## 2022-02-12 VITALS — BP 120/70 | HR 60 | Temp 98.0°F | Resp 18 | Ht 70.0 in | Wt 235.0 lb

## 2022-02-12 DIAGNOSIS — M898X8 Other specified disorders of bone, other site: Secondary | ICD-10-CM | POA: Insufficient documentation

## 2022-02-12 MED ORDER — HYDROCODONE-ACETAMINOPHEN 5-325 MG PO TABS
1.0000 | ORAL_TABLET | Freq: Four times a day (QID) | ORAL | 0 refills | Status: DC | PRN
Start: 2022-02-12 — End: 2022-03-22

## 2022-02-12 MED ORDER — CYCLOBENZAPRINE HCL 10 MG PO TABS: 10.0000 mg | ORAL_TABLET | Freq: Three times a day (TID) | ORAL | 0 refills | Status: DC | PRN

## 2022-02-12 MED ORDER — MELOXICAM 7.5 MG PO TABS: 7.5000 mg | ORAL_TABLET | Freq: Every day | ORAL | 0 refills | Status: DC

## 2022-02-12 NOTE — Patient Instructions (Addendum)
Report of recent back pain but also some rt iliac crest region pain on palpaton. Known  IMPRESSION: 1. 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 You may need more advanced imaging after regular xray done.  Will prescibe meloxicam 7.5 mg daily,  refilling norco and refilling your norco.  Follow up 10 days or sooner if needed

## 2022-02-12 NOTE — Progress Notes (Signed)
Subjective:    Patient ID: Randall Carter, male    DOB: Jan 04, 1946, 76 y.o.   MRN: [id number]  HPI Pt in for recent back pain.  Pt states he tried to lift large corner cabinet on porch into his house. Pt felt acute severe pain. When he changes position will have severe pain. Getting in bed cause pain. Describe back spasms.  Pt has been seen by ED initially and then went to urgent care.  Most recently urgent care advised use ibuprofen. Also rx'd norco and flexeril. It pt not moving/at standstill not pain. As soon as stands up has severe pain.   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 No pathologic fracture or new bone lesions. No involvement either hip. The pubic symphysis and SI joints are intact.  No significant intrapelvic abnormalities are identified. No pelvic inguinal lymphadenopathy. Stable left inguinal hernia containing fat.   10-11-2021.  IMPRESSION: 1. 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 No pathologic fracture or new bone lesions. 2. No significant intrapelvic abnormalities. 3. Stable left inguinal hernia containing fat.   Pt has prostate cancer and seeing Dr. Marin Olp 03-22-21.    Review of Systems  Constitutional:  Negative for chills, fatigue and fever.  Respiratory:  Negative for cough, chest tightness and wheezing.   Cardiovascular:  Negative for chest pain and palpitations.  Gastrointestinal:  Negative for diarrhea, nausea and rectal pain.  Musculoskeletal:  Positive for back pain. Negative for joint swelling and neck pain.  Skin:  Negative for rash.  Neurological:  Negative for dizziness and headaches.  Hematological:  Negative for adenopathy. Does not bruise/bleed easily.  Psychiatric/Behavioral:  Negative for confusion and decreased concentration.     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     Social History   Socioeconomic History   Marital status: Widowed    Spouse name: Not on file   Number of children: Not on file   Years of education: Not on file   Highest education level: Not on file  Occupational History   Not on file  Tobacco Use   Smoking status: Never    Passive exposure: Never   Smokeless tobacco: Never  Vaping Use   Vaping Use: Never used  Substance and Sexual Activity   Alcohol use: Not Currently   Drug use: Not Currently   Sexual activity: Not Currently  Other Topics Concern   Not on file  Social History Narrative   Not on file   Social Determinants of Health   Financial Resource Strain: Not on file  Food Insecurity: Not on file  Transportation Needs: Not on file  Physical Activity: Not on file  Stress: Not on file  Social Connections: Not on file  Intimate Partner Violence: Not on file    No past surgical history on file.  No family history on file.  Allergies  Allergen Reactions   Metformin And Related Diarrhea and Nausea And Vomiting   Tamsulosin Other (See Comments)    Current Outpatient Medications on File Prior to Visit  Medication Sig Dispense Refill   abiraterone acetate (ZYTIGA) 250 MG tablet TAKE 2 TABLETS ('500MG'$ ) BY MOUTH  DAILY ON AN EMPTY STOMACH 1 HOUR BEFORE OR 2 HOURS AFTER A MEAL 60 tablet 6   Accu-Chek FastClix Lancets  MISC Use one needle daily     albuterol (VENTOLIN HFA) 108 (90 Base) MCG/ACT inhaler Inhale 2 puffs into the lungs every 6 (six) hours as needed. 18 g 2   aspirin 81 MG EC tablet Take by mouth.     atenolol (TENORMIN) 50 MG tablet Take 1 tablet (50 mg total) by mouth daily. 90 tablet 3   Cholecalciferol 125 MCG (5000 UT) TABS Take by mouth.     fluticasone (FLONASE) 50 MCG/ACT nasal spray Place 1 spray into both nostrils daily.     glucose blood test strip Test  glucose one time per day     Loratadine 10 MG CAPS Take 10 mg by mouth daily.     Multiple Vitamin (MULTI-VITAMIN) tablet Take 1 tablet by mouth daily.     NIFEdipine (ADALAT CC) 60 MG 24 hr tablet Take 1 tablet (60 mg total) by mouth daily. 30 tablet 3   ondansetron (ZOFRAN-ODT) 4 MG disintegrating tablet Take 4 mg by mouth every 8 (eight) hours as needed.     predniSONE (DELTASONE) 5 MG tablet TAKE 1 TABLET(5 MG) BY MOUTH DAILY WITH BREAKFAST 30 tablet 6   simvastatin (ZOCOR) 40 MG tablet Take 40 mg by mouth at bedtime.     sitaGLIPtin (JANUVIA) 25 MG tablet Take 1 tablet (25 mg total) by mouth daily. 30 tablet 5   tadalafil (CIALIS) 10 MG tablet Take 1 tablet (10 mg total) by mouth daily as needed for erectile dysfunction. 30 tablet 3   tamsulosin (FLOMAX) 0.4 MG CAPS capsule Take 0.4 mg by mouth daily.     vitamin B-12 (CYANOCOBALAMIN) 500 MCG tablet Take by mouth.     No current facility-administered medications on file prior to visit.    BP 120/70   Pulse 60   Temp 98 F (36.7 C)   Resp 18   Ht '5\' 10"'$  (1.778 m)   Wt 235 lb (106.6 kg)   SpO2 98%   BMI 33.72 kg/m        Objective:   Physical Exam  General Appearance- Not in acute distress.    Chest and Lung Exam Auscultation: Breath sounds:-Normal. Clear even and unlabored. Adventitious sounds:- No Adventitious sounds.  Cardiovascular Auscultation:Rythm - Regular, rate and rythm. Heart Sounds -Normal heart sounds.  Abdomen Inspection:-Inspection Normal.  Palpation/Perucssion: Palpation and Percussion of the abdomen reveal- Non Tender, No Rebound tenderness, No rigidity(Guarding) and No Palpable abdominal masses.  Liver:-Normal.  Spleen:- Normal.   Back No mid lumbar spine tenderness to palpation. No pain on straight leg lift. Pain on lateral movements and flexion/extension of the spine.  Lower ext neurologic  L5-S1 sensation intact bilaterally. Normal patellar reflexes bilaterally. No foot drop  bilaterally.   Rt pelvis- anterior superior iliac crest area tender to palpation.       Assessment & Plan:   Patient Instructions  Report of recent back pain but also some rt iliac crest region pain on palpaton. Known  IMPRESSION: 1. 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 You may need more advanced imaging after regular xray done.  Will prescibe meloxicam 7.5 mg daily,  refilling norco and refilling your norco.  Follow up 10 days or sooner if needed       [employer], PA-C

## 2022-02-12 NOTE — Addendum Note (Signed)
Addended by: Anabel Halon on: 02/12/2022 02:29 PM   Modules accepted: Orders

## 2022-02-21 ENCOUNTER — Ambulatory Visit (INDEPENDENT_AMBULATORY_CARE_PROVIDER_SITE_OTHER): Payer: 59 | Admitting: Medical

## 2022-02-21 VITALS — BP 124/64 | HR 64 | Temp 98.5°F | Resp 18 | Ht 70.0 in | Wt 238.4 lb

## 2022-02-21 DIAGNOSIS — M545 Low back pain, unspecified: Secondary | ICD-10-CM

## 2022-02-21 NOTE — Progress Notes (Signed)
Subjective:    Patient ID: Randall Carter, male    DOB: November 04, 1945, 76 y.o.   MRN: [id number]  HPI  Pt in for follow up. Pt states his pain is resolved. He had pain in rt lower back/iliac crest area. He states last Friday pain started to resolve. He states spasm pain cleared up.  My assessment last visit.  "Report of recent back pain but also some rt iliac crest region pain on palpaton. Known  IMPRESSION: 1. 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 You may need more advanced imaging after regular xray done.   Will prescibe meloxicam 7.5 mg daily,  refilling norco and refilling your norco."  He used meloxicam and flexeril. Only used norco once a day.   Pt is going to get pet scan in January. Followed by Dr. Marin Olp.     Review of Systems  Constitutional:  Negative for diaphoresis, fatigue and fever.  Respiratory:  Negative for cough, chest tightness, shortness of breath and wheezing.   Cardiovascular:  Negative for chest pain and palpitations.  Gastrointestinal:  Negative for abdominal pain and anal bleeding.  Genitourinary:  Negative for dysuria, flank pain and frequency.  Musculoskeletal:  Negative for back pain.  Skin:  Negative for rash.  Neurological:  Negative for dizziness and headaches.  Hematological:  Negative for adenopathy. Does not bruise/bleed easily.  Psychiatric/Behavioral:  Negative for behavioral problems.     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     Social History   Socioeconomic History   Marital status: Widowed    Spouse name: Not on file   Number of children: Not on file   Years of education: Not on file   Highest education level: Not on file  Occupational History   Not on file  Tobacco Use   Smoking status: Never    Passive  exposure: Never   Smokeless tobacco: Never  Vaping Use   Vaping Use: Never used  Substance and Sexual Activity   Alcohol use: Not Currently   Drug use: Not Currently   Sexual activity: Not Currently  Other Topics Concern   Not on file  Social History Narrative   Not on file   Social Determinants of Health   Financial Resource Strain: Not on file  Food Insecurity: Not on file  Transportation Needs: Not on file  Physical Activity: Not on file  Stress: Not on file  Social Connections: Not on file  Intimate Partner Violence: Not on file    No past surgical history on file.  No family history on file.  Allergies  Allergen Reactions   Metformin And Related Diarrhea and Nausea And Vomiting   Tamsulosin Other (See Comments)    Current Outpatient Medications on File Prior to Visit  Medication Sig Dispense Refill   abiraterone acetate (ZYTIGA) 250 MG tablet TAKE 2 TABLETS ('500MG'$ ) BY MOUTH  DAILY ON AN EMPTY STOMACH 1 HOUR BEFORE OR 2 HOURS AFTER A MEAL 60 tablet 6   Accu-Chek FastClix Lancets MISC Use one needle daily     albuterol (VENTOLIN HFA) 108 (90 Base) MCG/ACT inhaler Inhale 2 puffs into the lungs every 6 (six) hours as needed. 18 g 2   aspirin 81 MG EC tablet Take by mouth.     atenolol (TENORMIN) 50 MG tablet Take  1 tablet (50 mg total) by mouth daily. 90 tablet 3   Cholecalciferol 125 MCG (5000 UT) TABS Take by mouth.     cyclobenzaprine (FLEXERIL) 10 MG tablet Take 1 tablet (10 mg total) by mouth 3 (three) times daily as needed for muscle spasms. 30 tablet 0   fluticasone (FLONASE) 50 MCG/ACT nasal spray Place 1 spray into both nostrils daily.     glucose blood test strip Test glucose one time per day     HYDROcodone-acetaminophen (NORCO) 5-325 MG tablet Take 1 tablet by mouth every 6 (six) hours as needed for moderate pain. 20 tablet 0   Loratadine 10 MG CAPS Take 10 mg by mouth daily.     meloxicam (MOBIC) 7.5 MG tablet Take 1 tablet (7.5 mg total) by mouth daily.  30 tablet 0   Multiple Vitamin (MULTI-VITAMIN) tablet Take 1 tablet by mouth daily.     NIFEdipine (ADALAT CC) 60 MG 24 hr tablet Take 1 tablet (60 mg total) by mouth daily. 30 tablet 3   ondansetron (ZOFRAN-ODT) 4 MG disintegrating tablet Take 4 mg by mouth every 8 (eight) hours as needed.     predniSONE (DELTASONE) 5 MG tablet TAKE 1 TABLET(5 MG) BY MOUTH DAILY WITH BREAKFAST 30 tablet 6   simvastatin (ZOCOR) 40 MG tablet Take 40 mg by mouth at bedtime.     sitaGLIPtin (JANUVIA) 25 MG tablet Take 1 tablet (25 mg total) by mouth daily. 30 tablet 5   tadalafil (CIALIS) 10 MG tablet Take 1 tablet (10 mg total) by mouth daily as needed for erectile dysfunction. 30 tablet 3   tamsulosin (FLOMAX) 0.4 MG CAPS capsule Take 0.4 mg by mouth daily.     vitamin B-12 (CYANOCOBALAMIN) 500 MCG tablet Take by mouth.     No current facility-administered medications on file prior to visit.    BP 124/64   Pulse 64   Temp 98.5 F (36.9 C)   Resp 18   Ht '5\' 10"'$  (1.778 m)   Wt 238 lb 6.4 oz (108.1 kg)   SpO2 99%   BMI 34.21 kg/m        Objective:   Physical Exam  General Mental Status- Alert. General Appearance- Not in acute distress.   Skin General: Color- Normal Color. Moisture- Normal Moisture.  Neck Carotid Arteries- Normal color. Moisture- Normal Moisture. No carotid bruits. No JVD.  Chest and Lung Exam Auscultation: Breath Sounds:-Normal.  Cardiovascular Auscultation:Rythm- Regular. Murmurs & Other Heart Sounds:Auscultation of the heart reveals- No Murmurs.  Abdomen Inspection:-Inspeection Normal. Palpation/Percussion:Note:No mass. Palpation and Percussion of the abdomen reveal- Non Tender, Non Distended + BS, no rebound or guarding.   Neurologic Cranial Nerve exam:- CN III-XII intact(No nystagmus), symmetric smile. Strength:- 5/5 equal and symmetric strength both upper and lower extremities.       Assessment & Plan:   Patient Instructions  Resolved rt side lower  back/iliac crest area pain with treatment. Way pain resolved indicates probably was muscle. At this point recommend trying to [place] meloxicam or flexeril to see if pain come back? Follow thru with PET scan ordered by Dr. Marin Olp.  Follow up in 3 months or sooner if needed.   Mackie Pai, PA-C

## 2022-02-21 NOTE — Patient Instructions (Addendum)
Resolved rt side lower back/iliac crest area pain with treatment. Way pain resolved indicates probably was muscle etiology. At this point recommend trying to [place] meloxicam or flexeril to see if pain come back? Follow thru with PET scan ordered by Dr. Marin Olp.  Follow up in 3 months or sooner if needed.

## 2022-03-22 ENCOUNTER — Telehealth: Payer: Self-pay | Admitting: *Deleted

## 2022-03-22 ENCOUNTER — Encounter: Payer: Self-pay | Admitting: Hematology & Oncology

## 2022-03-22 ENCOUNTER — Inpatient Hospital Stay (HOSPITAL_BASED_OUTPATIENT_CLINIC_OR_DEPARTMENT_OTHER): Payer: 59 | Admitting: Hematology & Oncology

## 2022-03-22 ENCOUNTER — Inpatient Hospital Stay: Payer: 59 | Attending: Oncology

## 2022-03-22 ENCOUNTER — Inpatient Hospital Stay: Payer: 59

## 2022-03-22 DIAGNOSIS — Z923 Personal history of irradiation: Secondary | ICD-10-CM | POA: Diagnosis not present

## 2022-03-22 DIAGNOSIS — C61 Malignant neoplasm of prostate: Secondary | ICD-10-CM

## 2022-03-22 DIAGNOSIS — C7951 Secondary malignant neoplasm of bone: Secondary | ICD-10-CM | POA: Diagnosis not present

## 2022-03-22 DIAGNOSIS — Z79899 Other long term (current) drug therapy: Secondary | ICD-10-CM | POA: Diagnosis not present

## 2022-03-22 LAB — CBC WITH DIFFERENTIAL (CANCER CENTER ONLY)
Abs Immature Granulocytes: 0.03 10*3/uL (ref 0.00–0.07)
Basophils Absolute: 0 10*3/uL (ref 0.0–0.1)
Basophils Relative: 0 %
Eosinophils Absolute: 0.1 10*3/uL (ref 0.0–0.5)
Eosinophils Relative: 2 %
HCT: 38.1 % — ABNORMAL LOW (ref 39.0–52.0)
Hemoglobin: 12.8 g/dL — ABNORMAL LOW (ref 13.0–17.0)
Immature Granulocytes: 1 %
Lymphocytes Relative: 30 %
Lymphs Abs: 2 10*3/uL (ref 0.7–4.0)
MCH: 33.4 pg (ref 26.0–34.0)
MCHC: 33.6 g/dL (ref 30.0–36.0)
MCV: 99.5 fL (ref 80.0–100.0)
Monocytes Absolute: 0.8 10*3/uL (ref 0.1–1.0)
Monocytes Relative: 12 %
Neutro Abs: 3.7 10*3/uL (ref 1.7–7.7)
Neutrophils Relative %: 55 %
Platelet Count: 140 10*3/uL — ABNORMAL LOW (ref 150–400)
RBC: 3.83 MIL/uL — ABNORMAL LOW (ref 4.22–5.81)
RDW: 12.1 % (ref 11.5–15.5)
WBC Count: 6.6 10*3/uL (ref 4.0–10.5)
nRBC: 0 % (ref 0.0–0.2)

## 2022-03-22 LAB — CMP (CANCER CENTER ONLY)
ALT: 16 U/L (ref 0–44)
AST: 15 U/L (ref 15–41)
Albumin: 4.3 g/dL (ref 3.5–5.0)
Alkaline Phosphatase: 53 U/L (ref 38–126)
Anion gap: 8 (ref 5–15)
BUN: 21 mg/dL (ref 8–23)
CO2: 26 mmol/L (ref 22–32)
Calcium: 9.5 mg/dL (ref 8.9–10.3)
Chloride: 107 mmol/L (ref 98–111)
Creatinine: 1.12 mg/dL (ref 0.61–1.24)
GFR, Estimated: >60 mL/min (ref >60)
Glucose, Bld: 192 mg/dL — ABNORMAL HIGH (ref 70–99)
Potassium: 4.1 mmol/L (ref 3.5–5.1)
Sodium: 141 mmol/L (ref 135–145)
Total Bilirubin: 0.6 mg/dL (ref 0.3–1.2)
Total Protein: 7.6 g/dL (ref 6.5–8.1)

## 2022-03-22 MED ORDER — LEUPROLIDE ACETATE (3 MONTH) 22.5 MG ~~LOC~~ KIT
22.5000 mg | PACK | Freq: Once | SUBCUTANEOUS | Status: AC
  Administered 2022-03-22: 22.5 mg via SUBCUTANEOUS
  Filled 2022-03-22: qty 22.5

## 2022-03-22 MED ORDER — DENOSUMAB 120 MG/1.7ML ~~LOC~~ SOLN
120.0000 mg | Freq: Once | SUBCUTANEOUS | Status: AC
  Administered 2022-03-22: 120 mg via SUBCUTANEOUS
  Filled 2022-03-22: qty 1.7

## 2022-03-22 MED ORDER — ABIRATERONE ACETATE 250 MG PO TABS: 500.0000 mg | ORAL_TABLET | Freq: Every day | ORAL | 6 refills | Status: DC

## 2022-03-22 NOTE — Telephone Encounter (Signed)
Faxed referral over to [facility] for PET scan [phone]

## 2022-03-22 NOTE — Progress Notes (Signed)
Hematology and Oncology Follow Up Visit  Randall Carter [id number] 1945/05/23 77 y.o. 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 daily-start on 09/14/2020 -- changed to 500 mg po q day on 12/11/2020 Xgeva 120 mg IM q. 3 months-next dose on 06/2022  Radiation therapy to the prostate bed -- completed on 09/21/2021     Interim History:  Randall Carter is back for his follow-up.  We last saw him 3 months ago.  He had no problems over the Holiday season.  He enjoyed the holidays with his family.  He is doing well with the Zytiga.  He does have occasional hot flashes.  His last PSA was less than 0.1.  His last testosterone was less than 3.  He has had no bleeding.  There is been no change in bowel or bladder habits.  He has had no cough or shortness of breath.  He is avoided COVID.  He has had no rashes.  He is a retired.  He is enjoying retirement.  Hopefully, he will be able to go on a nice vacation this year.  Overall, I would say that his performance status is probably ECOG 0.   Medications:  Current Outpatient Medications:    abiraterone acetate (ZYTIGA) 250 MG tablet, TAKE 2 TABLETS ('500MG'$ ) BY MOUTH  DAILY ON AN EMPTY STOMACH 1 HOUR BEFORE OR 2 HOURS AFTER A MEAL, Disp: 60 tablet, Rfl: 6   Accu-Chek FastClix Lancets MISC, Use one needle daily, Disp: , Rfl:    albuterol (VENTOLIN HFA) 108 (90 Base) MCG/ACT inhaler, Inhale 2 puffs into the lungs every 6 (six) hours as needed., Disp: 18 g, Rfl: 2   aspirin 81 MG EC tablet, Take by mouth., Disp: , Rfl:    atenolol (TENORMIN) 50 MG tablet, Take 1 tablet (50 mg total) by mouth daily., Disp: 90 tablet, Rfl: 3   Cholecalciferol 125 MCG (5000 UT) TABS, Take by mouth daily., Disp: , Rfl:    glucose blood test strip, Test glucose one time per day, Disp: , Rfl:    Loratadine 10 MG CAPS, Take 10 mg by  mouth daily., Disp: , Rfl:    Multiple Vitamin (MULTI-VITAMIN) tablet, Take 1 tablet by mouth daily., Disp: , Rfl:    NIFEdipine (ADALAT CC) 60 MG 24 hr tablet, Take 1 tablet (60 mg total) by mouth daily., Disp: 30 tablet, Rfl: 3   predniSONE (DELTASONE) 5 MG tablet, TAKE 1 TABLET(5 MG) BY MOUTH DAILY WITH BREAKFAST, Disp: 30 tablet, Rfl: 6   simvastatin (ZOCOR) 40 MG tablet, Take 40 mg by mouth at bedtime., Disp: , Rfl:    vitamin B-12 (CYANOCOBALAMIN) 500 MCG tablet, Take by mouth daily., Disp: , Rfl:    fluticasone (FLONASE) 50 MCG/ACT nasal spray, Place 1 spray into both nostrils daily. (Patient not taking: Reported on 03/22/2022), Disp: , Rfl:   Allergies:  Allergies  Allergen Reactions   Metformin And Related Diarrhea and Nausea And Vomiting   Tamsulosin Other (See Comments)    Past Medical History, Surgical history, Social history, and Family History were reviewed and updated.  Review of Systems: Review of Systems  Constitutional: Negative.   HENT:  Negative.    Eyes: Negative.   Respiratory: Negative.    Cardiovascular: Negative.   Gastrointestinal: Negative.   Endocrine: Positive for hot flashes.  Genitourinary: Negative.    Musculoskeletal: Negative.  Skin: Negative.   Neurological: Negative.   Hematological: Negative.   Psychiatric/Behavioral: Negative.      Physical Exam:  height is 5' 10.5" (1.791 m) and weight is 240 lb 1.3 oz (108.9 kg). His oral temperature is 98.5 F (36.9 C). His blood pressure is 139/66 and his pulse is 62. His respiration is 20 and oxygen saturation is 99%.   Wt Readings from Last 3 Encounters:  03/22/22 240 lb 1.3 oz (108.9 kg)  02/21/22 238 lb 6.4 oz (108.1 kg)  02/12/22 235 lb (106.6 kg)    Physical Exam Vitals reviewed.  HENT:     Head: Normocephalic and atraumatic.  Eyes:     Pupils: Pupils are equal, round, and reactive to light.  Cardiovascular:     Rate and Rhythm: Normal rate and regular rhythm.     Heart sounds: Normal  heart sounds.  Pulmonary:     Effort: Pulmonary effort is normal.     Breath sounds: Normal breath sounds.  Abdominal:     General: Bowel sounds are normal.     Palpations: Abdomen is soft.  Musculoskeletal:        General: No tenderness or deformity. Normal range of motion.     Cervical back: Normal range of motion.  Lymphadenopathy:     Cervical: No cervical adenopathy.  Skin:    General: Skin is warm and dry.     Findings: No erythema or rash.  Neurological:     Mental Status: He is alert and oriented to person, place, and time.  Psychiatric:        Behavior: Behavior normal.        Thought Content: Thought content normal.        Judgment: Judgment normal.      Lab Results  Component Value Date   WBC 6.6 03/22/2022   HGB 12.8 (L) 03/22/2022   HCT 38.1 (L) 03/22/2022   MCV 99.5 03/22/2022   PLT 140 (L) 03/22/2022     Chemistry      Component Value Date/Time   NA 142 12/20/2021 0816   K 4.2 12/20/2021 0816   CL 108 12/20/2021 0816   CO2 26 12/20/2021 0816   BUN 23 12/20/2021 0816   CREATININE 1.08 12/20/2021 0816      Component Value Date/Time   CALCIUM 9.6 12/20/2021 0816   ALKPHOS 46 12/20/2021 0816   AST 24 12/20/2021 0816   ALT 25 12/20/2021 0816   BILITOT 0.7 12/20/2021 0816      Impression and Plan: Randall Carter is a very nice 77 year old African-American male.  He has castrate sensitive prostate cancer.  We will see what the PSA is.  I think that as long as the PSA is low, we do not have to do any scans on him.  He will get his Lupron and Xgeva today.  We still follow him every 3 months.   [name], MD 1/19/20249:40 AM

## 2022-03-22 NOTE — Patient Instructions (Signed)
Denosumab Injection (Oncology) What is this medication? DENOSUMAB (den oh SUE mab) prevents weakened bones caused by cancer. It may also be used to treat noncancerous bone tumors that cannot be removed by surgery. It can also be used to treat high calcium levels in the blood caused by cancer. It works by blocking a protein that causes bones to break down quickly. This slows down the release of calcium from bones, which lowers calcium levels in your blood. It also makes your bones stronger and less likely to break (fracture). This medicine may be used for other purposes; ask your health care provider or pharmacist if you have questions. COMMON BRAND NAME(S): XGEVA What should I tell my care team before I take this medication? They need to know if you have any of these conditions: Dental disease Having surgery or tooth extraction Infection Kidney disease Low levels of calcium or vitamin D in the blood Malnutrition On hemodialysis Skin conditions or sensitivity Thyroid or parathyroid disease An unusual reaction to denosumab, other medications, foods, dyes, or preservatives Pregnant or trying to get pregnant Breast-feeding How should I use this medication? This medication is for injection under the skin. It is given by your care team in a hospital or clinic setting. A special MedGuide will be given to you before each treatment. Be sure to read this information carefully each time. Talk to your care team about the use of this medication in children. While it may be prescribed for children as young as 13 years for selected conditions, precautions do apply. Overdosage: If you think you have taken too much of this medicine contact a poison control center or emergency room at once. NOTE: This medicine is only for you. Do not share this medicine with others. What if I miss a dose? Keep appointments for follow-up doses. It is important not to miss your dose. Call your care team if you are unable to  keep an appointment. What may interact with this medication? Do not take this medication with any of the following: Other medications containing denosumab This medication may also interact with the following: Medications that lower your chance of fighting infection Steroid medications, such as prednisone or cortisone This list may not describe all possible interactions. Give your health care provider a list of all the medicines, herbs, non-prescription drugs, or dietary supplements you use. Also tell them if you smoke, drink alcohol, or use illegal drugs. Some items may interact with your medicine. What should I watch for while using this medication? Your condition will be monitored carefully while you are receiving this medication. You may need blood work while taking this medication. This medication may increase your risk of getting an infection. Call your care team for advice if you get a fever, chills, sore throat, or other symptoms of a cold or flu. Do not treat yourself. Try to avoid being around people who are sick. You should make sure you get enough calcium and vitamin D while you are taking this medication, unless your care team tells you not to. Discuss the foods you eat and the vitamins you take with your care team. Some people who take this medication have severe bone, joint, or muscle pain. This medication may also increase your risk for jaw problems or a broken thigh bone. Tell your care team right away if you have severe pain in your jaw, bones, joints, or muscles. Tell your care team if you have any pain that does not go away or that gets worse. Talk  to your care team if you may be pregnant. Serious birth defects can occur if you take this medication during pregnancy and for 5 months after the last dose. You will need a negative pregnancy test before starting this medication. Contraception is recommended while taking this medication and for 5 months after the last dose. Your care team  can help you find the option that works for you. What side effects may I notice from receiving this medication? Side effects that you should report to your care team as soon as possible: Allergic reactions--skin rash, itching, hives, swelling of the face, lips, tongue, or throat Bone, joint, or muscle pain Low calcium level--muscle pain or cramps, confusion, tingling, or numbness in the hands or feet Osteonecrosis of the jaw--pain, swelling, or redness in the mouth, numbness of the jaw, poor healing after dental work, unusual discharge from the mouth, visible bones in the mouth Side effects that usually do not require medical attention (report to your care team if they continue or are bothersome): Cough Diarrhea Fatigue Headache Nausea This list may not describe all possible side effects. Call your doctor for medical advice about side effects. You may report side effects to FDA at 1-800-FDA-1088. Where should I keep my medication? This medication is given in a hospital or clinic. It will not be stored at home. NOTE: This sheet is a summary. It may not cover all possible information. If you have questions about this medicine, talk to your doctor, pharmacist, or health care provider.  2023 Elsevier/Gold Standard (2021-07-09 00:00:00)  Leuprolide Suspension for Injection (Prostate Cancer) What is this medication? LEUPROLIDE (loo PROE lide) reduces the symptoms of prostate cancer. It works by decreasing levels of the hormone testosterone in the body. This prevents prostate cancer cells from spreading or growing. This medicine may be used for other purposes; ask your health care provider or pharmacist if you have questions. COMMON BRAND NAME(S): Eligard, Lupron Depot, Lupron Depot-Ped, Lutrate Depot, Viadur What should I tell my care team before I take this medication? They need to know if you have any of these conditions: Diabetes Heart disease Heart failure High or low levels of  electrolytes, such as magnesium, potassium, or sodium in your blood Irregular heartbeat or rhythm Seizures An unusual or allergic reaction to leuprolide, other medications, foods, dyes, or preservatives Pregnant or trying to get pregnant Breast-feeding How should I use this medication? This medication is injected under the skin or into a muscle. It is given by your care team in a hospital or clinic setting. Talk to your care team about the use of this medication in children. Special care may be needed. Overdosage: If you think you have taken too much of this medicine contact a poison control center or emergency room at once. NOTE: This medicine is only for you. Do not share this medicine with others. What if I miss a dose? Keep appointments for follow-up doses. It is important not to miss your dose. Call your care team if you are unable to keep an appointment. What may interact with this medication? Do not take this medication with any of the following: Cisapride Dronedarone Ketoconazole Levoketoconazole Pimozide Thioridazine This medication may also interact with the following: Other medications that cause heart rhythm changes This list may not describe all possible interactions. Give your health care provider a list of all the medicines, herbs, non-prescription drugs, or dietary supplements you use. Also tell them if you smoke, drink alcohol, or use illegal drugs. Some items may interact  with your medicine. What should I watch for while using this medication? Visit your care team for regular checks on your progress. Tell your care team if your symptoms do not start to get better or if they get worse. This medication may increase blood sugar. The risk may be higher in patients who already have diabetes. Ask your care team what you can do to lower the risk of diabetes while taking this medication. This medication may cause infertility. Talk to your care team if you are concerned about your  fertility. Heart attacks and strokes have been reported with the use of this medication. Get emergency help if you develop signs or symptoms of a heart attack or stroke. Talk to your care team about the risks and benefits of this medication. What side effects may I notice from receiving this medication? Side effects that you should report to your care team as soon as possible: Allergic reactions--skin rash, itching, hives, swelling of the face, lips, tongue, or throat Heart attack--pain or tightness in the chest, shoulders, arms, or jaw, nausea, shortness of breath, cold or clammy skin, feeling faint or lightheaded Heart rhythm changes--fast or irregular heartbeat, dizziness, feeling faint or lightheaded, chest pain, trouble breathing High blood sugar (hyperglycemia)--increased thirst or amount of urine, unusual weakness or fatigue, blurry vision Mood swings, irritability, hostility Seizures Stroke--sudden numbness or weakness of the face, arm, or leg, trouble speaking, confusion, trouble walking, loss of balance or coordination, dizziness, severe headache, change in vision Thoughts of suicide or self-harm, worsening mood, feelings of depression Side effects that usually do not require medical attention (report to your care team if they continue or are bothersome): Bone pain Change in sex drive or performance General discomfort and fatigue Hot flashes Muscle pain Pain, redness, or irritation at injection site Swelling of the ankles, hands, or feet This list may not describe all possible side effects. Call your doctor for medical advice about side effects. You may report side effects to FDA at 1-800-FDA-1088. Where should I keep my medication? This medication is given in a hospital or clinic. It will not be stored at home. NOTE: This sheet is a summary. It may not cover all possible information. If you have questions about this medicine, talk to your doctor, pharmacist, or health care  provider.  2023 Elsevier/Gold Standard (2021-05-02 00:00:00)

## 2022-03-23 LAB — TESTOSTERONE: Testosterone: <3 ng/dL — ABNORMAL LOW (ref 264–916)

## 2022-03-23 LAB — PSA, TOTAL AND FREE
PSA, Free Pct: UNDETERMINED %
PSA, Free: <0.02 ng/mL
Prostate Specific Ag, Serum: <0.1 ng/mL (ref 0.0–4.0)

## 2022-03-25 ENCOUNTER — Telehealth: Payer: Self-pay | Admitting: *Deleted

## 2022-03-25 NOTE — Telephone Encounter (Signed)
-----  Message from [name], MD sent at 03/25/2022  6:37 AM EST ----- Call - the PSA is still not detectable!!  Great job!!   Randall Carter

## 2022-03-25 NOTE — Telephone Encounter (Signed)
As noted below by Dr. Marin Olp, I informed the patient that the PSA is still not detectable. He verbalized understanding.

## 2022-04-16 ENCOUNTER — Other Ambulatory Visit: Payer: Self-pay | Admitting: Medical

## 2022-04-23 ENCOUNTER — Other Ambulatory Visit: Payer: Self-pay | Admitting: Medical

## 2022-05-03 DIAGNOSIS — Z923 Personal history of irradiation: Secondary | ICD-10-CM | POA: Insufficient documentation

## 2022-05-03 DIAGNOSIS — Z191 Hormone sensitive malignancy status: Secondary | ICD-10-CM | POA: Diagnosis not present

## 2022-05-03 DIAGNOSIS — R232 Flushing: Secondary | ICD-10-CM | POA: Diagnosis not present

## 2022-05-03 HISTORY — DX: Personal history of irradiation: Z92.3

## 2022-05-16 ENCOUNTER — Other Ambulatory Visit: Payer: Self-pay | Admitting: Hematology & Oncology

## 2022-05-16 DIAGNOSIS — C7951 Secondary malignant neoplasm of bone: Secondary | ICD-10-CM

## 2022-05-22 ENCOUNTER — Ambulatory Visit: Payer: Medicare Other | Admitting: Medical

## 2022-05-31 DIAGNOSIS — I700 Atherosclerosis of aorta: Secondary | ICD-10-CM | POA: Diagnosis not present

## 2022-05-31 DIAGNOSIS — C7951 Secondary malignant neoplasm of bone: Secondary | ICD-10-CM | POA: Diagnosis not present

## 2022-06-04 ENCOUNTER — Telehealth: Payer: Self-pay | Admitting: Medical

## 2022-06-04 NOTE — Telephone Encounter (Signed)
Contacted [place] Scrape to schedule their annual wellness visit. Call back at later date: 08/2022  Sherol Dade; [facility] Direct Dial: [phone]

## 2022-06-17 ENCOUNTER — Telehealth: Payer: Self-pay

## 2022-06-17 ENCOUNTER — Other Ambulatory Visit (HOSPITAL_COMMUNITY): Payer: Self-pay

## 2022-06-17 NOTE — Telephone Encounter (Signed)
Oral Oncology Patient Advocate Encounter  Re-authorization   Received notification that prior authorization for Abiraterone is due for renewal.   PA submitted on 06/17/22  Key [id number]  Status is pending     Ardeen Fillers, CPhT Oncology Pharmacy Patient Advocate  [facility]  [phone] (phone) [phone] (fax) 06/17/2022 10:30 AM

## 2022-06-17 NOTE — Telephone Encounter (Signed)
Oral Oncology Patient Advocate Encounter  Prior Authorization for Abiraterone has been approved.    PA# [id number]  Effective dates: 06/17/22 through 06/17/23  Patient may continue to fill at specialty pharmacy.     Ardeen Fillers, CPhT Oncology Pharmacy Patient Advocate  [facility]  [phone] (phone) [phone] (fax) 06/17/2022 11:31 AM

## 2022-06-18 ENCOUNTER — Other Ambulatory Visit: Payer: Self-pay | Admitting: Hematology & Oncology

## 2022-06-18 DIAGNOSIS — C7951 Secondary malignant neoplasm of bone: Secondary | ICD-10-CM

## 2022-06-21 ENCOUNTER — Inpatient Hospital Stay (HOSPITAL_BASED_OUTPATIENT_CLINIC_OR_DEPARTMENT_OTHER): Payer: Medicare Other | Admitting: Hematology & Oncology

## 2022-06-21 ENCOUNTER — Inpatient Hospital Stay: Payer: Medicare Other | Attending: Oncology

## 2022-06-21 ENCOUNTER — Inpatient Hospital Stay: Payer: Medicare Other

## 2022-06-21 ENCOUNTER — Encounter: Payer: Self-pay | Admitting: Hematology & Oncology

## 2022-06-21 VITALS — BP 139/78 | HR 57 | Temp 98.9°F | Resp 20 | Ht 70.5 in | Wt 239.1 lb

## 2022-06-21 DIAGNOSIS — C61 Malignant neoplasm of prostate: Secondary | ICD-10-CM | POA: Diagnosis not present

## 2022-06-21 DIAGNOSIS — Z79899 Other long term (current) drug therapy: Secondary | ICD-10-CM | POA: Diagnosis not present

## 2022-06-21 DIAGNOSIS — Z923 Personal history of irradiation: Secondary | ICD-10-CM | POA: Insufficient documentation

## 2022-06-21 DIAGNOSIS — C7800 Secondary malignant neoplasm of unspecified lung: Secondary | ICD-10-CM

## 2022-06-21 DIAGNOSIS — Z7952 Long term (current) use of systemic steroids: Secondary | ICD-10-CM | POA: Diagnosis not present

## 2022-06-21 DIAGNOSIS — C7951 Secondary malignant neoplasm of bone: Secondary | ICD-10-CM

## 2022-06-21 LAB — CBC WITH DIFFERENTIAL (CANCER CENTER ONLY)
Abs Immature Granulocytes: 0.02 10*3/uL (ref 0.00–0.07)
Basophils Absolute: 0 10*3/uL (ref 0.0–0.1)
Basophils Relative: 1 %
Eosinophils Absolute: 0.1 10*3/uL (ref 0.0–0.5)
Eosinophils Relative: 2 %
HCT: 37.7 % — ABNORMAL LOW (ref 39.0–52.0)
Hemoglobin: 12.8 g/dL — ABNORMAL LOW (ref 13.0–17.0)
Immature Granulocytes: 0 %
Lymphocytes Relative: 34 %
Lymphs Abs: 2.1 10*3/uL (ref 0.7–4.0)
MCH: 33.5 pg (ref 26.0–34.0)
MCHC: 34 g/dL (ref 30.0–36.0)
MCV: 98.7 fL (ref 80.0–100.0)
Monocytes Absolute: 0.8 10*3/uL (ref 0.1–1.0)
Monocytes Relative: 13 %
Neutro Abs: 2.9 10*3/uL (ref 1.7–7.7)
Neutrophils Relative %: 50 %
Platelet Count: 167 10*3/uL (ref 150–400)
RBC: 3.82 MIL/uL — ABNORMAL LOW (ref 4.22–5.81)
RDW: 11.9 % (ref 11.5–15.5)
WBC Count: 6 10*3/uL (ref 4.0–10.5)
nRBC: 0 % (ref 0.0–0.2)

## 2022-06-21 LAB — CMP (CANCER CENTER ONLY)
ALT: 20 U/L (ref 0–44)
AST: 19 U/L (ref 15–41)
Albumin: 4.1 g/dL (ref 3.5–5.0)
Alkaline Phosphatase: 54 U/L (ref 38–126)
Anion gap: 9 (ref 5–15)
BUN: 26 mg/dL — ABNORMAL HIGH (ref 8–23)
CO2: 26 mmol/L (ref 22–32)
Calcium: 9.4 mg/dL (ref 8.9–10.3)
Chloride: 106 mmol/L (ref 98–111)
Creatinine: 1.16 mg/dL (ref 0.61–1.24)
GFR, Estimated: >60 mL/min (ref >60)
Glucose, Bld: 153 mg/dL — ABNORMAL HIGH (ref 70–99)
Potassium: 4 mmol/L (ref 3.5–5.1)
Sodium: 141 mmol/L (ref 135–145)
Total Bilirubin: 0.7 mg/dL (ref 0.3–1.2)
Total Protein: 7.5 g/dL (ref 6.5–8.1)

## 2022-06-21 MED ORDER — LEUPROLIDE ACETATE (3 MONTH) 22.5 MG ~~LOC~~ KIT
22.5000 mg | PACK | Freq: Once | SUBCUTANEOUS | Status: AC
  Administered 2022-06-21: 22.5 mg via SUBCUTANEOUS
  Filled 2022-06-21: qty 22.5

## 2022-06-21 MED ORDER — DENOSUMAB 120 MG/1.7ML ~~LOC~~ SOLN
120.0000 mg | Freq: Once | SUBCUTANEOUS | Status: AC
  Administered 2022-06-21: 120 mg via SUBCUTANEOUS
  Filled 2022-06-21: qty 1.7

## 2022-06-21 NOTE — Progress Notes (Signed)
Hematology and Oncology Follow Up Visit  Randall Carter [id number] 25-Oct-1945 77 y.o. 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 daily-start on 09/14/2020 -- changed to 500 mg po q day on 12/11/2020 Xgeva 120 mg IM q. 3 months-next dose on 09/2022  Radiation therapy to the prostate bed -- completed on 09/21/2021     Interim History:  Randall Carter is back for his follow-up.  He is doing quite well.  He did have a PET scan done on 05/31/2022.  There is no evidence of any obvious metastatic disease.  However, there is still activity in the prostate.  This is a little bit troublesome as he has already had radiotherapy to the prostate.  His last PSA was less than 0.1.  His testosterone was less than 3.  He does have hot flashes.  Regard had to figure out how we can try to help this active area in the prostate.  I do not know if he might be considered for salvage surgery.  I think this might be reasonable considering the fact that he does not have any evidence of metastatic disease elsewhere.  I will have to speak with Urology about this.  He has had no cough or shortness of breath.  He has had no rashes.  Is been no bleeding.  Has had no leg swelling.  His appetite has been good.  He is trying to watch what he eats.  Overall, I would say that his performance status is probably ECOG 1.   Medications:  Current Outpatient Medications:    abiraterone acetate (ZYTIGA) 250 MG tablet, TAKE 2 TABLETS BY MOUTH DAILY ON AN EMPTY STOMACH 1 HOUR BEFORE  OR 2 HOURS AFTER A MEAL, Disp: 60 tablet, Rfl: 6   Accu-Chek FastClix Lancets MISC, Use one needle daily, Disp: , Rfl:    aspirin 81 MG EC tablet, Take 81 mg by mouth., Disp: , Rfl:    atenolol (TENORMIN) 50 MG tablet, Take 1 tablet (50 mg total) by mouth daily., Disp: 90 tablet, Rfl: 3   Cholecalciferol 125 MCG  (5000 UT) TABS, Take by mouth daily., Disp: , Rfl:    fexofenadine (ALLEGRA) 60 MG tablet, Take 60 mg by mouth daily., Disp: , Rfl:    glucose blood test strip, Test glucose one time per day, Disp: , Rfl:    ipratropium (ATROVENT) 0.06 % nasal spray, Place 1 spray into both nostrils 4 (four) times daily., Disp: , Rfl:    Multiple Vitamin (MULTI-VITAMIN) tablet, Take 1 tablet by mouth daily., Disp: , Rfl:    NIFEdipine (ADALAT CC) 60 MG 24 hr tablet, TAKE 1 TABLET(60 MG) BY MOUTH DAILY, Disp: 30 tablet, Rfl: 3   predniSONE (DELTASONE) 5 MG tablet, TAKE 1 TABLET(5 MG) BY MOUTH DAILY WITH BREAKFAST, Disp: 30 tablet, Rfl: 6   simvastatin (ZOCOR) 40 MG tablet, Take 40 mg by mouth at bedtime., Disp: , Rfl:    vitamin B-12 (CYANOCOBALAMIN) 500 MCG tablet, Take by mouth daily., Disp: , Rfl:    albuterol (VENTOLIN HFA) 108 (90 Base) MCG/ACT inhaler, Inhale 2 puffs into the lungs every 6 (six) hours as needed. (Patient not taking: Reported on 06/21/2022), Disp: 18 g, Rfl: 2   fluticasone (FLONASE) 50 MCG/ACT nasal spray, Place 1 spray into both nostrils daily. (Patient not taking: Reported on 03/22/2022), Disp: , Rfl:    naproxen (  NAPROSYN) 500 MG tablet, Take 500 mg by mouth. (Patient not taking: Reported on 06/21/2022), Disp: , Rfl:   Allergies:  Allergies  Allergen Reactions   Metformin And Related Diarrhea and Nausea And Vomiting   Tamsulosin Other (See Comments)    Past Medical History, Surgical history, Social history, and Family History were reviewed and updated.  Review of Systems: Review of Systems  Constitutional: Negative.   HENT:  Negative.    Eyes: Negative.   Respiratory: Negative.    Cardiovascular: Negative.   Gastrointestinal: Negative.   Endocrine: Positive for hot flashes.  Genitourinary: Negative.    Musculoskeletal: Negative.   Skin: Negative.   Neurological: Negative.   Hematological: Negative.   Psychiatric/Behavioral: Negative.      Physical Exam:  height is 5'  10.5" (1.791 m) and weight is 239 lb 1.3 oz (108.4 kg). His oral temperature is 98.9 F (37.2 C). His blood pressure is 139/78 and his pulse is 57 (abnormal). His respiration is 20 and oxygen saturation is 99%.   Wt Readings from Last 3 Encounters:  06/21/22 239 lb 1.3 oz (108.4 kg)  03/22/22 240 lb 1.3 oz (108.9 kg)  02/21/22 238 lb 6.4 oz (108.1 kg)    Physical Exam Vitals reviewed.  HENT:     Head: Normocephalic and atraumatic.  Eyes:     Pupils: Pupils are equal, round, and reactive to light.  Cardiovascular:     Rate and Rhythm: Normal rate and regular rhythm.     Heart sounds: Normal heart sounds.  Pulmonary:     Effort: Pulmonary effort is normal.     Breath sounds: Normal breath sounds.  Abdominal:     General: Bowel sounds are normal.     Palpations: Abdomen is soft.  Musculoskeletal:        General: No tenderness or deformity. Normal range of motion.     Cervical back: Normal range of motion.  Lymphadenopathy:     Cervical: No cervical adenopathy.  Skin:    General: Skin is warm and dry.     Findings: No erythema or rash.  Neurological:     Mental Status: He is alert and oriented to person, place, and time.  Psychiatric:        Behavior: Behavior normal.        Thought Content: Thought content normal.        Judgment: Judgment normal.     Lab Results  Component Value Date   WBC 6.0 06/21/2022   HGB 12.8 (L) 06/21/2022   HCT 37.7 (L) 06/21/2022   MCV 98.7 06/21/2022   PLT 167 06/21/2022     Chemistry      Component Value Date/Time   NA 141 06/21/2022 0826   K 4.0 06/21/2022 0826   CL 106 06/21/2022 0826   CO2 26 06/21/2022 0826   BUN 26 (H) 06/21/2022 0826   CREATININE 1.16 06/21/2022 0826      Component Value Date/Time   CALCIUM 9.4 06/21/2022 0826   ALKPHOS 54 06/21/2022 0826   AST 19 06/21/2022 0826   ALT 20 06/21/2022 0826   BILITOT 0.7 06/21/2022 0826      Impression and Plan: Randall Carter is a very nice 77 year old African-American  male.  He has castrate sensitive prostate cancer.  Again, he has some active areas in the prostate.  Again we will get have to figure out how we can try to help this.  I will have to speak with Urology.  I do not know  if he would be a candidate for robotic surgery.  This might be difficult given that he is already had radiotherapy to the area.  I would not think he could have more radiation therapy.  I do not know this is something that Proton therapy could help with.  Again I do not think this is done around here.  He will go ahead and get his Rivka Barbara today.  He will get his Lupron today.  We will have to see him back in another 3 months.  Josph Macho, MD 4/19/20249:17 AM

## 2022-06-21 NOTE — Patient Instructions (Signed)
Denosumab Injection (Oncology) What is this medication? DENOSUMAB (den oh SUE mab) prevents weakened bones caused by cancer. It may also be used to treat noncancerous bone tumors that cannot be removed by surgery. It can also be used to treat high calcium levels in the blood caused by cancer. It works by blocking a protein that causes bones to break down quickly. This slows down the release of calcium from bones, which lowers calcium levels in your blood. It also makes your bones stronger and less likely to break (fracture). This medicine may be used for other purposes; ask your health care provider or pharmacist if you have questions. COMMON BRAND NAME(S): XGEVA What should I tell my care team before I take this medication? They need to know if you have any of these conditions: Dental disease Having surgery or tooth extraction Infection Kidney disease Low levels of calcium or vitamin D in the blood Malnutrition On hemodialysis Skin conditions or sensitivity Thyroid or parathyroid disease An unusual reaction to denosumab, other medications, foods, dyes, or preservatives Pregnant or trying to get pregnant Breast-feeding How should I use this medication? This medication is for injection under the skin. It is given by your care team in a hospital or clinic setting. A special MedGuide will be given to you before each treatment. Be sure to read this information carefully each time. Talk to your care team about the use of this medication in children. While it may be prescribed for children as young as 13 years for selected conditions, precautions do apply. Overdosage: If you think you have taken too much of this medicine contact a poison control center or emergency room at once. NOTE: This medicine is only for you. Do not share this medicine with others. What if I miss a dose? Keep appointments for follow-up doses. It is important not to miss your dose. Call your care team if you are unable to  keep an appointment. What may interact with this medication? Do not take this medication with any of the following: Other medications containing denosumab This medication may also interact with the following: Medications that lower your chance of fighting infection Steroid medications, such as prednisone or cortisone This list may not describe all possible interactions. Give your health care provider a list of all the medicines, herbs, non-prescription drugs, or dietary supplements you use. Also tell them if you smoke, drink alcohol, or use illegal drugs. Some items may interact with your medicine. What should I watch for while using this medication? Your condition will be monitored carefully while you are receiving this medication. You may need blood work while taking this medication. This medication may increase your risk of getting an infection. Call your care team for advice if you get a fever, chills, sore throat, or other symptoms of a cold or flu. Do not treat yourself. Try to avoid being around people who are sick. You should make sure you get enough calcium and vitamin D while you are taking this medication, unless your care team tells you not to. Discuss the foods you eat and the vitamins you take with your care team. Some people who take this medication have severe bone, joint, or muscle pain. This medication may also increase your risk for jaw problems or a broken thigh bone. Tell your care team right away if you have severe pain in your jaw, bones, joints, or muscles. Tell your care team if you have any pain that does not go away or that gets worse. Talk   to your care team if you may be pregnant. Serious birth defects can occur if you take this medication during pregnancy and for 5 months after the last dose. You will need a negative pregnancy test before starting this medication. Contraception is recommended while taking this medication and for 5 months after the last dose. Your care team  can help you find the option that works for you. What side effects may I notice from receiving this medication? Side effects that you should report to your care team as soon as possible: Allergic reactions--skin rash, itching, hives, swelling of the face, lips, tongue, or throat Bone, joint, or muscle pain Low calcium level--muscle pain or cramps, confusion, tingling, or numbness in the hands or feet Osteonecrosis of the jaw--pain, swelling, or redness in the mouth, numbness of the jaw, poor healing after dental work, unusual discharge from the mouth, visible bones in the mouth Side effects that usually do not require medical attention (report to your care team if they continue or are bothersome): Cough Diarrhea Fatigue Headache Nausea This list may not describe all possible side effects. Call your doctor for medical advice about side effects. You may report side effects to FDA at 1-800-FDA-1088. Where should I keep my medication? This medication is given in a hospital or clinic. It will not be stored at home. NOTE: This sheet is a summary. It may not cover all possible information. If you have questions about this medicine, talk to your doctor, pharmacist, or health care provider.  2023 Elsevier/Gold Standard (2021-07-09 00:00:00) Leuprolide Suspension for Injection (Prostate Cancer) What is this medication? LEUPROLIDE (loo PROE lide) reduces the symptoms of prostate cancer. It works by decreasing levels of the hormone testosterone in the body. This prevents prostate cancer cells from spreading or growing. This medicine may be used for other purposes; ask your health care provider or pharmacist if you have questions. COMMON BRAND NAME(S): Eligard, Lupron Depot, Lupron Depot-Ped, Lutrate Depot, Viadur What should I tell my care team before I take this medication? They need to know if you have any of these conditions: Diabetes Heart disease Heart failure High or low levels of  electrolytes, such as magnesium, potassium, or sodium in your blood Irregular heartbeat or rhythm Seizures An unusual or allergic reaction to leuprolide, other medications, foods, dyes, or preservatives Pregnant or trying to get pregnant Breast-feeding How should I use this medication? This medication is injected under the skin or into a muscle. It is given by your care team in a hospital or clinic setting. Talk to your care team about the use of this medication in children. Special care may be needed. Overdosage: If you think you have taken too much of this medicine contact a poison control center or emergency room at once. NOTE: This medicine is only for you. Do not share this medicine with others. What if I miss a dose? Keep appointments for follow-up doses. It is important not to miss your dose. Call your care team if you are unable to keep an appointment. What may interact with this medication? Do not take this medication with any of the following: Cisapride Dronedarone Ketoconazole Levoketoconazole Pimozide Thioridazine This medication may also interact with the following: Other medications that cause heart rhythm changes This list may not describe all possible interactions. Give your health care provider a list of all the medicines, herbs, non-prescription drugs, or dietary supplements you use. Also tell them if you smoke, drink alcohol, or use illegal drugs. Some items may interact with   your medicine. What should I watch for while using this medication? Visit your care team for regular checks on your progress. Tell your care team if your symptoms do not start to get better or if they get worse. This medication may increase blood sugar. The risk may be higher in patients who already have diabetes. Ask your care team what you can do to lower the risk of diabetes while taking this medication. This medication may cause infertility. Talk to your care team if you are concerned about your  fertility. Heart attacks and strokes have been reported with the use of this medication. Get emergency help if you develop signs or symptoms of a heart attack or stroke. Talk to your care team about the risks and benefits of this medication. What side effects may I notice from receiving this medication? Side effects that you should report to your care team as soon as possible: Allergic reactions--skin rash, itching, hives, swelling of the face, lips, tongue, or throat Heart attack--pain or tightness in the chest, shoulders, arms, or jaw, nausea, shortness of breath, cold or clammy skin, feeling faint or lightheaded Heart rhythm changes--fast or irregular heartbeat, dizziness, feeling faint or lightheaded, chest pain, trouble breathing High blood sugar (hyperglycemia)--increased thirst or amount of urine, unusual weakness or fatigue, blurry vision Mood swings, irritability, hostility Seizures Stroke--sudden numbness or weakness of the face, arm, or leg, trouble speaking, confusion, trouble walking, loss of balance or coordination, dizziness, severe headache, change in vision Thoughts of suicide or self-harm, worsening mood, feelings of depression Side effects that usually do not require medical attention (report to your care team if they continue or are bothersome): Bone pain Change in sex drive or performance General discomfort and fatigue Hot flashes Muscle pain Pain, redness, or irritation at injection site Swelling of the ankles, hands, or feet This list may not describe all possible side effects. Call your doctor for medical advice about side effects. You may report side effects to FDA at 1-800-FDA-1088. Where should I keep my medication? This medication is given in a hospital or clinic. It will not be stored at home. NOTE: This sheet is a summary. It may not cover all possible information. If you have questions about this medicine, talk to your doctor, pharmacist, or health care  provider.  2023 Elsevier/Gold Standard (2021-05-02 00:00:00)  

## 2022-06-23 LAB — PSA, TOTAL AND FREE
PSA, Free Pct: UNDETERMINED %
PSA, Free: <0.02 ng/mL
Prostate Specific Ag, Serum: <0.1 ng/mL (ref 0.0–4.0)

## 2022-06-23 LAB — TESTOSTERONE: Testosterone: <3 ng/dL — ABNORMAL LOW (ref 264–916)

## 2022-06-24 ENCOUNTER — Telehealth: Payer: Self-pay | Admitting: *Deleted

## 2022-06-24 NOTE — Telephone Encounter (Signed)
-----   Message from Josph Macho, MD sent at 06/24/2022  6:08 AM EDT ----- Call - the PSA is still not detectable!! I will see what to do about the prostate activity on the PET scan.  Cindee Lame

## 2022-06-24 NOTE — Telephone Encounter (Signed)
Called pt, unable to reach. Lmovm for pt with results.

## 2022-07-04 DIAGNOSIS — R062 Wheezing: Secondary | ICD-10-CM | POA: Diagnosis not present

## 2022-07-15 ENCOUNTER — Other Ambulatory Visit: Payer: Self-pay | Admitting: Hematology & Oncology

## 2022-07-15 ENCOUNTER — Telehealth: Payer: Self-pay

## 2022-07-15 ENCOUNTER — Other Ambulatory Visit: Payer: Self-pay

## 2022-07-15 DIAGNOSIS — C61 Malignant neoplasm of prostate: Secondary | ICD-10-CM

## 2022-07-15 NOTE — Telephone Encounter (Signed)
External referral for him to see Dr.Polascik sent per Dr.Ennever. called and scheduled this for May 20th at 8am with dr.polascik.   Attempted to call patient to inform him of new appt and no answer, left message for him to call back.  Appt info: Monday May 20,2024 with Dr.Polascik At [facility] in [facility] Address: [address] [place], [place] [address] Phone: [phone] Fax: [phone]

## 2022-07-19 ENCOUNTER — Encounter: Payer: Self-pay | Admitting: Medical

## 2022-07-19 ENCOUNTER — Ambulatory Visit (INDEPENDENT_AMBULATORY_CARE_PROVIDER_SITE_OTHER): Payer: Medicare Other | Admitting: Medical

## 2022-07-19 VITALS — BP 132/60 | HR 63 | Temp 98.3°F | Resp 18 | Ht 70.0 in | Wt 238.8 lb

## 2022-07-19 DIAGNOSIS — J0100 Acute maxillary sinusitis, unspecified: Secondary | ICD-10-CM | POA: Diagnosis not present

## 2022-07-19 DIAGNOSIS — E119 Type 2 diabetes mellitus without complications: Secondary | ICD-10-CM | POA: Diagnosis not present

## 2022-07-19 DIAGNOSIS — R062 Wheezing: Secondary | ICD-10-CM

## 2022-07-19 DIAGNOSIS — J309 Allergic rhinitis, unspecified: Secondary | ICD-10-CM

## 2022-07-19 DIAGNOSIS — R059 Cough, unspecified: Secondary | ICD-10-CM | POA: Diagnosis not present

## 2022-07-19 LAB — COMPREHENSIVE METABOLIC PANEL
ALT: 21 U/L (ref 0–53)
AST: 18 U/L (ref 0–37)
Albumin: 4.1 g/dL (ref 3.5–5.2)
Alkaline Phosphatase: 63 U/L (ref 39–117)
BUN: 20 mg/dL (ref 6–23)
CO2: 24 mEq/L (ref 19–32)
Calcium: 9.4 mg/dL (ref 8.4–10.5)
Chloride: 105 mEq/L (ref 96–112)
Creatinine, Ser: 1.1 mg/dL (ref 0.40–1.50)
GFR: 65.78 mL/min (ref >60.00)
Glucose, Bld: 274 mg/dL — ABNORMAL HIGH (ref 70–99)
Potassium: 4.4 mEq/L (ref 3.5–5.1)
Sodium: 138 mEq/L (ref 135–145)
Total Bilirubin: 0.6 mg/dL (ref 0.2–1.2)
Total Protein: 7.3 g/dL (ref 6.0–8.3)

## 2022-07-19 LAB — HEMOGLOBIN A1C: Hgb A1c MFr Bld: 8.4 % — ABNORMAL HIGH (ref 4.6–6.5)

## 2022-07-19 MED ORDER — BENZONATATE 100 MG PO CAPS
100.0000 mg | ORAL_CAPSULE | Freq: Three times a day (TID) | ORAL | 0 refills | Status: DC | PRN
Start: 2022-07-19 — End: 2022-12-18

## 2022-07-19 MED ORDER — BUDESONIDE-FORMOTEROL FUMARATE 160-4.5 MCG/ACT IN AERO
2.0000 | INHALATION_SPRAY | Freq: Two times a day (BID) | RESPIRATORY_TRACT | 12 refills | Status: AC
Start: 2022-07-19 — End: ?

## 2022-07-19 MED ORDER — AMOXICILLIN-POT CLAVULANATE 875-125 MG PO TABS
1.0000 | ORAL_TABLET | Freq: Two times a day (BID) | ORAL | 0 refills | Status: DC
Start: 2022-07-19 — End: 2022-09-20

## 2022-07-19 NOTE — Progress Notes (Deleted)
Subjective:    Patient ID: Randall Carter, male    DOB: 04/23/1945, 77 y.o.   MRN: [id number]  HPI Pt in for cough off and on for one month.   Pt states he thinks is from allergies. He feels some nasal congestion and some chest congestion. He is coughing up some yellow mucus. Most of time cough is severe at night.   He has some wheezing at night. He has some increase cough at night as well.  He is irrigating nose with normal nasal saline at night..  Pt states if he is at home when grass gets cut symptoms are worse.  No fever, no chills or sweats.   Pt is on both allergra and flonase. Not helping as much as prior years. Back in march needes some prednisone.   Pt last A1c was 8.3. pt states sugar levels have been in 120-125 range. Pt states can't tolerate metformin.   Review of Systems  Constitutional:  Negative for chills, fatigue and fever.  HENT:  Positive for congestion, sinus pressure, sinus pain and sneezing. Negative for ear pain, nosebleeds and rhinorrhea.   Respiratory:  Positive for cough and wheezing. Negative for shortness of breath.   Cardiovascular:  Negative for chest pain and palpitations.  Gastrointestinal:  Negative for abdominal pain.  Musculoskeletal:  Negative for back pain and joint swelling.  Skin:  Negative for rash.  Neurological:  Negative for dizziness and light-headedness.  Hematological:  Negative for adenopathy. Does not bruise/bleed easily.  Psychiatric/Behavioral:  Negative for behavioral problems and confusion.     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     Social History   Socioeconomic History   Marital status: Widowed    Spouse name: Not on file   Number of children: Not on file   Years of education: Not on file   Highest education level: Not on file  Occupational History   Not  on file  Tobacco Use   Smoking status: Never    Passive exposure: Never   Smokeless tobacco: Never  Vaping Use   Vaping Use: Never used  Substance and Sexual Activity   Alcohol use: Not Currently   Drug use: Not Currently   Sexual activity: Not Currently  Other Topics Concern   Not on file  Social History Narrative   Not on file   Social Determinants of Health   Financial Resource Strain: Not on file  Food Insecurity: Not on file  Transportation Needs: Not on file  Physical Activity: Not on file  Stress: Not on file  Social Connections: Not on file  Intimate Partner Violence: Not on file    No past surgical history on file.  No family history on file.  Allergies  Allergen Reactions   Metformin And Related Diarrhea and Nausea And Vomiting   Tamsulosin Other (See Comments)    Current Outpatient Medications on File Prior to Visit  Medication Sig Dispense Refill   abiraterone acetate (ZYTIGA) 250 MG tablet TAKE 2 TABLETS BY MOUTH DAILY ON AN EMPTY STOMACH 1 HOUR BEFORE  OR 2 HOURS AFTER A MEAL 60 tablet 6   Accu-Chek FastClix Lancets MISC Use one needle daily     albuterol (VENTOLIN HFA) 108 (90 Base) MCG/ACT inhaler Inhale 2 puffs into the lungs every 6 (six) hours as needed. (Patient not taking: Reported on 06/21/2022)  18 g 2   aspirin 81 MG EC tablet Take 81 mg by mouth.     atenolol (TENORMIN) 50 MG tablet Take 1 tablet (50 mg total) by mouth daily. 90 tablet 3   Cholecalciferol 125 MCG (5000 UT) TABS Take by mouth daily.     fexofenadine (ALLEGRA) 60 MG tablet Take 60 mg by mouth daily.     fluticasone (FLONASE) 50 MCG/ACT nasal spray Place 1 spray into both nostrils daily. (Patient not taking: Reported on 03/22/2022)     glucose blood test strip Test glucose one time per day     ipratropium (ATROVENT) 0.06 % nasal spray Place 1 spray into both nostrils 4 (four) times daily.     Multiple Vitamin (MULTI-VITAMIN) tablet Take 1 tablet by mouth daily.     naproxen  (NAPROSYN) 500 MG tablet Take 500 mg by mouth. (Patient not taking: Reported on 06/21/2022)     NIFEdipine (ADALAT CC) 60 MG 24 hr tablet TAKE 1 TABLET(60 MG) BY MOUTH DAILY 30 tablet 3   predniSONE (DELTASONE) 5 MG tablet TAKE 1 TABLET(5 MG) BY MOUTH DAILY WITH BREAKFAST 30 tablet 6   simvastatin (ZOCOR) 40 MG tablet Take 40 mg by mouth at bedtime.     vitamin B-12 (CYANOCOBALAMIN) 500 MCG tablet Take by mouth daily.     No current facility-administered medications on file prior to visit.    BP 132/60   Pulse 63   Temp 98.3 F (36.8 C)   Resp 18   Ht 5\' 10"  (1.778 m)   Wt 238 lb 12.8 oz (108.3 kg)   SpO2 94%   BMI 34.26 kg/m        Objective:   Physical Exam  General Mental Status- Alert. General Appearance- Not in acute distress.   Skin General: Color- Normal Color. Moisture- Normal Moisture.  Neck Carotid Arteries- Normal color. Moisture- Normal Moisture. No carotid bruits. No JVD.  Chest and Lung Exam Auscultation: Breath Sounds:-Normal.  Cardiovascular Auscultation:Rythm- Regular. Murmurs & Other Heart Sounds:Auscultation of the heart reveals- No Murmurs.  Abdomen Inspection:-Inspeection Normal. Palpation/Percussion:Note:No mass. Palpation and Percussion of the abdomen reveal- Non Tender, Non Distended + BS, no rebound or guarding.   Neurologic Cranial Nerve exam:- CN III-XII intact(No nystagmus), symmetric smile. Strength:- 5/5 equal and symmetric strength both upper and lower extremities.   Heent- mild sinus pressure. Boggy turbinates. +pnd.    Assessment & Plan:   Patient Instructions  1. Controlled type 2 diabetes mellitus without complication, without long-term current use of insulin (HCC) Recent well controlled with diet alone per your sugar checks. Not using metformin.  Will do labs as may need to advise increase your prendnisone dose but this can increase sugar. - Hemoglobin A1c - Comp Met (CMET)  2. Allergic rhinitis, unspecified seasonality,  unspecified trigger Continue allegra and flonase.  3. Subacute maxillary sinusitis  - amoxicillin-clavulanate (AUGMENTIN) 875-125 MG tablet; Take 1 tablet by mouth 2 (two) times daily.  Dispense: 20 tablet; Refill: 0  4. Wheezing Start symbicort. If staring this can hold albuterol use. - budesonide-formoterol (SYMBICORT) 160-4.5 MCG/ACT inhaler; Inhale 2 puffs into the lungs 2 (two) times daily.  Dispense: 1 each; Refill: 12  5. Cough, unspecified type  - benzonatate (TESSALON) 100 MG capsule; Take 1 capsule (100 mg total) by mouth 3 (three) times daily as needed for cough.  Dispense: 30 capsule; Refill: 0   Follow up in 10-14 days or sooner if needed.   Esperanza Richters, PA-C

## 2022-07-19 NOTE — Progress Notes (Signed)
   Subjective:    Patient ID: Randall Carter, male    DOB: 1945/07/25, 77 y.o.   MRN: [id number]  HPI  Pt patient thinks he has recent allergy flare.  He reports chronic nasal congestion that is worse when he goes outside or if he is exposed to grass clippings.  He is on Allegra and Flonase but it seems to be not helping recently.  Some recent sinus pressure and a wheezing.  He does note history of asthma.  He is bringing up mucus when he coughs.  Cough is worse at night.  Symptoms have been going on for at least 2 weeks.  He notes that back in March she needed prednisone to help resolve his symptoms.  Patient is diabetic but recently his sugars have been in the 1 20-1 25 range when he checks.    Review of Systems  Constitutional:  Negative for fatigue.  HENT:  Positive for congestion, postnasal drip and sneezing.   Respiratory:  Positive for cough and wheezing.   Cardiovascular:  Negative for chest pain and palpitations.  Gastrointestinal:  Negative for abdominal pain.  Genitourinary:  Negative for dysuria.  Musculoskeletal:  Negative for back pain.  Neurological:  Negative for dizziness and numbness.  Hematological:  Negative for adenopathy. Does not bruise/bleed easily.  Psychiatric/Behavioral:  Negative for behavioral problems and confusion.        Objective:   Physical Exam  General Mental Status- Alert. General Appearance- Not in acute distress.   Skin General: Color- Normal Color. Moisture- Normal Moisture.  Neck Carotid Arteries- Normal color. Moisture- Normal Moisture. No carotid bruits. No JVD.  Chest and Lung Exam Auscultation: Breath Sounds:-Normal.  Cardiovascular Auscultation:Rythm- Regular. Murmurs & Other Heart Sounds:Auscultation of the heart reveals- No Murmurs.  Abdomen Inspection:-Inspeection Normal. Palpation/Percussion:Note:No mass. Palpation and Percussion of the abdomen reveal- Non Tender, Non Distended + BS, no rebound or  guarding.    Neurologic Cranial Nerve exam:- CN III-XII intact(No nystagmus), symmetric smile. Strength:- 5/5 equal and symmetric strength both upper and lower extremities.   Heent- mild maxillary sinus pressure. Boggy turbinates. +pnd.      Assessment & Plan:   Patient Instructions  1. Controlled type 2 diabetes mellitus without complication, without long-term current use of insulin (HCC) Recent well controlled with diet alone per your sugar checks. Not using metformin.  Will do labs as may need to advise increase your prendnisone dose but this can increase sugar. - Hemoglobin A1c - Comp Met (CMET)  2. Allergic rhinitis, unspecified seasonality, unspecified trigger Continue allegra and flonase.  3. Subacute maxillary sinusitis  - amoxicillin-clavulanate (AUGMENTIN) 875-125 MG tablet; Take 1 tablet by mouth 2 (two) times daily.  Dispense: 20 tablet; Refill: 0  4. Wheezing Start symbicort. If staring this can hold albuterol use. - budesonide-formoterol (SYMBICORT) 160-4.5 MCG/ACT inhaler; Inhale 2 puffs into the lungs 2 (two) times daily.  Dispense: 1 each; Refill: 12  5. Cough, unspecified type  - benzonatate (TESSALON) 100 MG capsule; Take 1 capsule (100 mg total) by mouth 3 (three) times daily as needed for cough.  Dispense: 30 capsule; Refill: 0   Follow up in 10-14 days or sooner if needed.

## 2022-07-19 NOTE — Progress Notes (Deleted)
   Subjective:    Patient ID: Randall Carter, male    DOB: 1945-10-23, 77 y.o.   MRN: [id number]  HPI    Review of Systems     Objective:   Physical Exam        Assessment & Plan:

## 2022-07-19 NOTE — Patient Instructions (Signed)
1. Controlled type 2 diabetes mellitus without complication, without long-term current use of insulin (HCC) Recent well controlled with diet alone per your sugar checks. Not using metformin.  Will do labs as may need to advise increase your prendnisone dose but this can increase sugar. - Hemoglobin A1c - Comp Met (CMET)  2. Allergic rhinitis, unspecified seasonality, unspecified trigger Continue allegra and flonase.  3. Subacute maxillary sinusitis  - amoxicillin-clavulanate (AUGMENTIN) 875-125 MG tablet; Take 1 tablet by mouth 2 (two) times daily.  Dispense: 20 tablet; Refill: 0  4. Wheezing Start symbicort. If staring this can hold albuterol use. - budesonide-formoterol (SYMBICORT) 160-4.5 MCG/ACT inhaler; Inhale 2 puffs into the lungs 2 (two) times daily.  Dispense: 1 each; Refill: 12  5. Cough, unspecified type  - benzonatate (TESSALON) 100 MG capsule; Take 1 capsule (100 mg total) by mouth 3 (three) times daily as needed for cough.  Dispense: 30 capsule; Refill: 0   Follow up in 10-14 days or sooner if needed.

## 2022-07-20 MED ORDER — MONTELUKAST SODIUM 10 MG PO TABS: 10.0000 mg | ORAL_TABLET | Freq: Every day | ORAL | 3 refills | Status: DC

## 2022-07-20 NOTE — Addendum Note (Signed)
Addended by: Gwenevere Abbot on: 07/20/2022 11:54 AM   Modules accepted: Orders

## 2022-07-23 ENCOUNTER — Telehealth: Payer: Self-pay

## 2022-07-23 MED ORDER — SITAGLIPTIN PHOSPHATE 25 MG PO TABS: 25.0000 mg | ORAL_TABLET | Freq: Every day | ORAL | 3 refills | Status: DC

## 2022-07-23 NOTE — Addendum Note (Signed)
Addended by: Gwenevere Abbot on: 07/23/2022 12:43 PM   Modules accepted: Orders

## 2022-07-23 NOTE — Telephone Encounter (Signed)
PA cancelled by plan.   [id number] has been cancelled for JANUVIA TAB 25MG , use as directed, for the following reason: For further assistance in processing this claim, the pharmacy may contact Help Desk at [phone]) [phone]- [phone]. Reviewed by: [email or web address], R.Ph.

## 2022-07-23 NOTE — Telephone Encounter (Signed)
PA initiated via Covermymeds; KEY: [id number]. Awaiting determination.

## 2022-07-25 MED ORDER — SAXAGLIPTIN HCL 2.5 MG PO TABS: 2.5000 mg | ORAL_TABLET | Freq: Every day | ORAL | 11 refills | Status: DC

## 2022-07-25 NOTE — Addendum Note (Signed)
Addended by: Gwenevere Abbot on: 07/25/2022 02:02 PM   Modules accepted: Orders

## 2022-07-25 NOTE — Telephone Encounter (Signed)
Saxagliptin   Randall Carter   Spoke with PA team and these are covered by patient's plan.

## 2022-08-13 ENCOUNTER — Telehealth: Payer: Self-pay | Admitting: Medical

## 2022-08-13 ENCOUNTER — Other Ambulatory Visit: Payer: Self-pay | Admitting: Medical

## 2022-08-13 NOTE — Telephone Encounter (Signed)
Copied from CRM [id number]. Topic: Medicare AWV >> Aug 13, 2022 10:42 AM Payton Doughty wrote: Reason for CRM: N/A 08/12/2022 FOR AWV - MAILBOX FULL  Verlee Rossetti; Care Guide Ambulatory Clinical Support [place] [facility] Direct Dial: [phone]

## 2022-08-26 DIAGNOSIS — Z08 Encounter for follow-up examination after completed treatment for malignant neoplasm: Secondary | ICD-10-CM | POA: Diagnosis not present

## 2022-09-09 DIAGNOSIS — D696 Thrombocytopenia, unspecified: Secondary | ICD-10-CM | POA: Diagnosis not present

## 2022-09-10 ENCOUNTER — Ambulatory Visit (INDEPENDENT_AMBULATORY_CARE_PROVIDER_SITE_OTHER): Payer: Medicare Other | Admitting: Family Medicine

## 2022-09-10 ENCOUNTER — Encounter: Payer: Self-pay | Admitting: Family Medicine

## 2022-09-10 VITALS — BP 132/63 | HR 64 | Temp 98.1°F | Ht 70.0 in | Wt 243.0 lb

## 2022-09-10 DIAGNOSIS — J029 Acute pharyngitis, unspecified: Secondary | ICD-10-CM | POA: Diagnosis not present

## 2022-09-10 LAB — POC COVID19 BINAXNOW: SARS Coronavirus 2 Ag: NEGATIVE

## 2022-09-10 LAB — POCT RAPID STREP A (OFFICE): Rapid Strep A Screen: NEGATIVE

## 2022-09-10 NOTE — Progress Notes (Signed)
Acute Office Visit  Subjective:     Patient ID: Randall Carter, male    DOB: [birth year], 77 y.o.   MRN: [id number]  Chief Complaint  Patient presents with   Sore Throat     Patient is in today for sore throat.   Discussed the use of AI scribe software for clinical note transcription with the patient, who gave verbal consent to proceed.  History of Present Illness   The patient, with a history of allergies managed with Allegra, presents with a sore throat that began a couple of days ago. They attribute the onset to sleeping with an air conditioner blowing directly on them, which they believe dried out their throat. Upon waking, they experienced severe pain and difficulty swallowing, likened to swallowing water. They deny any exposure to sick contacts and have not taken a home COVID test.  Over the past day, the throat pain has improved significantly, with the patient estimating they are at 80% of their normal state. They deny any associated symptoms such as headaches, fevers, chills, coughing, sneezing, or runny nose. They have not noticed any swollen lymph nodes. The pain upon swallowing has decreased significantly compared to the previous day. He wants to be sure he doesn't have strep.          ROS All review of systems negative except what is listed in the HPI      Objective:    BP 132/63   Pulse 64   Temp 98.1 F (36.7 C) (Oral)   Ht 5\' 10"  (1.778 m)   Wt 243 lb (110.2 kg)   SpO2 98%   BMI 34.87 kg/m    Physical Exam Vitals reviewed.  Constitutional:      Appearance: Normal appearance.  HENT:     Head: Normocephalic and atraumatic.     Right Ear: Tympanic membrane normal.     Left Ear: Tympanic membrane normal.     Mouth/Throat:     Mouth: Mucous membranes are moist.     Pharynx: Oropharynx is clear. Posterior oropharyngeal erythema present. No oropharyngeal exudate.  Eyes:     Conjunctiva/sclera: Conjunctivae normal.  Cardiovascular:     Rate and  Rhythm: Normal rate and regular rhythm.     Pulses: Normal pulses.     Heart sounds: Normal heart sounds.  Pulmonary:     Effort: Pulmonary effort is normal.     Breath sounds: Normal breath sounds.  Musculoskeletal:     Cervical back: Normal range of motion and neck supple.  Lymphadenopathy:     Cervical: No cervical adenopathy.  Skin:    General: Skin is warm and dry.  Neurological:     Mental Status: He is alert and oriented to person, place, and time.  Psychiatric:        Mood and Affect: Mood normal.        Behavior: Behavior normal.        Thought Content: Thought content normal.        Judgment: Judgment normal.     Results for orders placed or performed in visit on 09/10/22  POC COVID-19 BinaxNow  Result Value Ref Range   SARS Coronavirus 2 Ag Negative Negative  POCT rapid strep A  Result Value Ref Range   Rapid Strep A Screen Negative Negative        Assessment & Plan:   Problem List Items Addressed This Visit   None Visit Diagnoses     Sore throat    -  Primary   Relevant Orders   POC COVID-19 BinaxNow (Completed)   POCT rapid strep A (Completed)       Pharyngitis: Sudden onset of severe sore throat, possibly due to sleeping with air conditioner on. No associated fever, cough, or other systemic symptoms. Throat appears slightly red on examination. Improvement noted within a day, currently about 80% back to normal. -Perform rapid strep and COVID-19 tests to rule out infectious causes at patient request. Both negative. -Advise warm liquids, hot tea, honey, lemon, and Tylenol for symptomatic relief. Allergic Rhinitis: Chronic condition, currently managed with Allegra. -Continue Allegra as needed for allergy symptoms.        No orders of the defined types were placed in this encounter.   Return if symptoms worsen or fail to improve.  Clayborne Dana, NP

## 2022-09-10 NOTE — Patient Instructions (Signed)
-  Perform rapid strep and COVID-19 tests to rule out infectious causes. Both negative. -Advise warm liquids, hot tea, honey, lemon, and Tylenol for symptomatic relief. Allergic Rhinitis: Chronic condition, currently managed with Allegra. -Continue Allegra as needed for allergy symptoms.

## 2022-09-20 ENCOUNTER — Inpatient Hospital Stay: Payer: 59

## 2022-09-20 ENCOUNTER — Inpatient Hospital Stay: Payer: 59 | Attending: Oncology

## 2022-09-20 ENCOUNTER — Encounter: Payer: Self-pay | Admitting: Hematology & Oncology

## 2022-09-20 ENCOUNTER — Inpatient Hospital Stay (HOSPITAL_BASED_OUTPATIENT_CLINIC_OR_DEPARTMENT_OTHER): Payer: 59 | Admitting: Hematology & Oncology

## 2022-09-20 ENCOUNTER — Other Ambulatory Visit: Payer: Self-pay

## 2022-09-20 VITALS — BP 135/74 | HR 76 | Temp 98.5°F | Resp 18 | Ht 70.0 in | Wt 247.0 lb

## 2022-09-20 DIAGNOSIS — C7951 Secondary malignant neoplasm of bone: Secondary | ICD-10-CM | POA: Diagnosis not present

## 2022-09-20 DIAGNOSIS — C7800 Secondary malignant neoplasm of unspecified lung: Secondary | ICD-10-CM

## 2022-09-20 DIAGNOSIS — Z5112 Encounter for antineoplastic immunotherapy: Secondary | ICD-10-CM | POA: Diagnosis not present

## 2022-09-20 DIAGNOSIS — C61 Malignant neoplasm of prostate: Secondary | ICD-10-CM

## 2022-09-20 DIAGNOSIS — Z79899 Other long term (current) drug therapy: Secondary | ICD-10-CM | POA: Insufficient documentation

## 2022-09-20 DIAGNOSIS — Z923 Personal history of irradiation: Secondary | ICD-10-CM | POA: Diagnosis not present

## 2022-09-20 LAB — CBC WITH DIFFERENTIAL (CANCER CENTER ONLY)
Abs Immature Granulocytes: 0.02 10*3/uL (ref 0.00–0.07)
Basophils Absolute: 0 10*3/uL (ref 0.0–0.1)
Basophils Relative: 1 %
Eosinophils Absolute: 0.1 10*3/uL (ref 0.0–0.5)
Eosinophils Relative: 2 %
HCT: 38.3 % — ABNORMAL LOW (ref 39.0–52.0)
Hemoglobin: 12.6 g/dL — ABNORMAL LOW (ref 13.0–17.0)
Immature Granulocytes: 0 %
Lymphocytes Relative: 28 %
Lymphs Abs: 2 10*3/uL (ref 0.7–4.0)
MCH: 32.8 pg (ref 26.0–34.0)
MCHC: 32.9 g/dL (ref 30.0–36.0)
MCV: 99.7 fL (ref 80.0–100.0)
Monocytes Absolute: 0.8 10*3/uL (ref 0.1–1.0)
Monocytes Relative: 12 %
Neutro Abs: 4 10*3/uL (ref 1.7–7.7)
Neutrophils Relative %: 57 %
Platelet Count: 160 10*3/uL (ref 150–400)
RBC: 3.84 MIL/uL — ABNORMAL LOW (ref 4.22–5.81)
RDW: 12.8 % (ref 11.5–15.5)
WBC Count: 7 10*3/uL (ref 4.0–10.5)
nRBC: 0 % (ref 0.0–0.2)

## 2022-09-20 LAB — CMP (CANCER CENTER ONLY)
ALT: 15 U/L (ref 0–44)
AST: 17 U/L (ref 15–41)
Albumin: 4.4 g/dL (ref 3.5–5.0)
Alkaline Phosphatase: 53 U/L (ref 38–126)
Anion gap: 10 (ref 5–15)
BUN: 21 mg/dL (ref 8–23)
CO2: 24 mmol/L (ref 22–32)
Calcium: 9.5 mg/dL (ref 8.9–10.3)
Chloride: 106 mmol/L (ref 98–111)
Creatinine: 1.07 mg/dL (ref 0.61–1.24)
GFR, Estimated: >60 mL/min (ref >60)
Glucose, Bld: 143 mg/dL — ABNORMAL HIGH (ref 70–99)
Potassium: 3.9 mmol/L (ref 3.5–5.1)
Sodium: 140 mmol/L (ref 135–145)
Total Bilirubin: 0.6 mg/dL (ref 0.3–1.2)
Total Protein: 7.6 g/dL (ref 6.5–8.1)

## 2022-09-20 MED ORDER — LEUPROLIDE ACETATE (3 MONTH) 22.5 MG ~~LOC~~ KIT
22.5000 mg | PACK | Freq: Once | SUBCUTANEOUS | Status: AC
  Administered 2022-09-20: 22.5 mg via SUBCUTANEOUS
  Filled 2022-09-20: qty 22.5

## 2022-09-20 MED ORDER — DENOSUMAB 120 MG/1.7ML ~~LOC~~ SOLN
120.0000 mg | Freq: Once | SUBCUTANEOUS | Status: AC
  Administered 2022-09-20: 120 mg via SUBCUTANEOUS

## 2022-09-20 NOTE — Patient Instructions (Signed)
Denosumab Injection (Oncology) What is this medication? DENOSUMAB (den oh SUE mab) prevents weakened bones caused by cancer. It may also be used to treat noncancerous bone tumors that cannot be removed by surgery. It can also be used to treat high calcium levels in the blood caused by cancer. It works by blocking a protein that causes bones to break down quickly. This slows down the release of calcium from bones, which lowers calcium levels in your blood. It also makes your bones stronger and less likely to break (fracture). This medicine may be used for other purposes; ask your health care provider or pharmacist if you have questions. COMMON BRAND NAME(S): XGEVA What should I tell my care team before I take this medication? They need to know if you have any of these conditions: Dental disease Having surgery or tooth extraction Infection Kidney disease Low levels of calcium or vitamin D in the blood Malnutrition On hemodialysis Skin conditions or sensitivity Thyroid or parathyroid disease An unusual reaction to denosumab, other medications, foods, dyes, or preservatives Pregnant or trying to get pregnant Breast-feeding How should I use this medication? This medication is for injection under the skin. It is given by your care team in a hospital or clinic setting. A special MedGuide will be given to you before each treatment. Be sure to read this information carefully each time. Talk to your care team about the use of this medication in children. While it may be prescribed for children as young as 13 years for selected conditions, precautions do apply. Overdosage: If you think you have taken too much of this medicine contact a poison control center or emergency room at once. NOTE: This medicine is only for you. Do not share this medicine with others. What if I miss a dose? Keep appointments for follow-up doses. It is important not to miss your dose. Call your care team if you are unable to  keep an appointment. What may interact with this medication? Do not take this medication with any of the following: Other medications containing denosumab This medication may also interact with the following: Medications that lower your chance of fighting infection Steroid medications, such as prednisone or cortisone This list may not describe all possible interactions. Give your health care provider a list of all the medicines, herbs, non-prescription drugs, or dietary supplements you use. Also tell them if you smoke, drink alcohol, or use illegal drugs. Some items may interact with your medicine. What should I watch for while using this medication? Your condition will be monitored carefully while you are receiving this medication. You may need blood work while taking this medication. This medication may increase your risk of getting an infection. Call your care team for advice if you get a fever, chills, sore throat, or other symptoms of a cold or flu. Do not treat yourself. Try to avoid being around people who are sick. You should make sure you get enough calcium and vitamin D while you are taking this medication, unless your care team tells you not to. Discuss the foods you eat and the vitamins you take with your care team. Some people who take this medication have severe bone, joint, or muscle pain. This medication may also increase your risk for jaw problems or a broken thigh bone. Tell your care team right away if you have severe pain in your jaw, bones, joints, or muscles. Tell your care team if you have any pain that does not go away or that gets worse. Talk   to your care team if you may be pregnant. Serious birth defects can occur if you take this medication during pregnancy and for 5 months after the last dose. You will need a negative pregnancy test before starting this medication. Contraception is recommended while taking this medication and for 5 months after the last dose. Your care team  can help you find the option that works for you. What side effects may I notice from receiving this medication? Side effects that you should report to your care team as soon as possible: Allergic reactions--skin rash, itching, hives, swelling of the face, lips, tongue, or throat Bone, joint, or muscle pain Low calcium level--muscle pain or cramps, confusion, tingling, or numbness in the hands or feet Osteonecrosis of the jaw--pain, swelling, or redness in the mouth, numbness of the jaw, poor healing after dental work, unusual discharge from the mouth, visible bones in the mouth Side effects that usually do not require medical attention (report to your care team if they continue or are bothersome): Cough Diarrhea Fatigue Headache Nausea This list may not describe all possible side effects. Call your doctor for medical advice about side effects. You may report side effects to FDA at 1-800-FDA-1088. Where should I keep my medication? This medication is given in a hospital or clinic. It will not be stored at home. NOTE: This sheet is a summary. It may not cover all possible information. If you have questions about this medicine, talk to your doctor, pharmacist, or health care provider.  2023 Elsevier/Gold Standard (2021-07-09 00:00:00) Leuprolide Suspension for Injection (Prostate Cancer) What is this medication? LEUPROLIDE (loo PROE lide) reduces the symptoms of prostate cancer. It works by decreasing levels of the hormone testosterone in the body. This prevents prostate cancer cells from spreading or growing. This medicine may be used for other purposes; ask your health care provider or pharmacist if you have questions. COMMON BRAND NAME(S): Eligard, Lupron Depot, Lupron Depot-Ped, Lutrate Depot, Viadur What should I tell my care team before I take this medication? They need to know if you have any of these conditions: Diabetes Heart disease Heart failure High or low levels of  electrolytes, such as magnesium, potassium, or sodium in your blood Irregular heartbeat or rhythm Seizures An unusual or allergic reaction to leuprolide, other medications, foods, dyes, or preservatives Pregnant or trying to get pregnant Breast-feeding How should I use this medication? This medication is injected under the skin or into a muscle. It is given by your care team in a hospital or clinic setting. Talk to your care team about the use of this medication in children. Special care may be needed. Overdosage: If you think you have taken too much of this medicine contact a poison control center or emergency room at once. NOTE: This medicine is only for you. Do not share this medicine with others. What if I miss a dose? Keep appointments for follow-up doses. It is important not to miss your dose. Call your care team if you are unable to keep an appointment. What may interact with this medication? Do not take this medication with any of the following: Cisapride Dronedarone Ketoconazole Levoketoconazole Pimozide Thioridazine This medication may also interact with the following: Other medications that cause heart rhythm changes This list may not describe all possible interactions. Give your health care provider a list of all the medicines, herbs, non-prescription drugs, or dietary supplements you use. Also tell them if you smoke, drink alcohol, or use illegal drugs. Some items may interact with  your medicine. What should I watch for while using this medication? Visit your care team for regular checks on your progress. Tell your care team if your symptoms do not start to get better or if they get worse. This medication may increase blood sugar. The risk may be higher in patients who already have diabetes. Ask your care team what you can do to lower the risk of diabetes while taking this medication. This medication may cause infertility. Talk to your care team if you are concerned about your  fertility. Heart attacks and strokes have been reported with the use of this medication. Get emergency help if you develop signs or symptoms of a heart attack or stroke. Talk to your care team about the risks and benefits of this medication. What side effects may I notice from receiving this medication? Side effects that you should report to your care team as soon as possible: Allergic reactions--skin rash, itching, hives, swelling of the face, lips, tongue, or throat Heart attack--pain or tightness in the chest, shoulders, arms, or jaw, nausea, shortness of breath, cold or clammy skin, feeling faint or lightheaded Heart rhythm changes--fast or irregular heartbeat, dizziness, feeling faint or lightheaded, chest pain, trouble breathing High blood sugar (hyperglycemia)--increased thirst or amount of urine, unusual weakness or fatigue, blurry vision Mood swings, irritability, hostility Seizures Stroke--sudden numbness or weakness of the face, arm, or leg, trouble speaking, confusion, trouble walking, loss of balance or coordination, dizziness, severe headache, change in vision Thoughts of suicide or self-harm, worsening mood, feelings of depression Side effects that usually do not require medical attention (report to your care team if they continue or are bothersome): Bone pain Change in sex drive or performance General discomfort and fatigue Hot flashes Muscle pain Pain, redness, or irritation at injection site Swelling of the ankles, hands, or feet This list may not describe all possible side effects. Call your doctor for medical advice about side effects. You may report side effects to FDA at 1-800-FDA-1088. Where should I keep my medication? This medication is given in a hospital or clinic. It will not be stored at home. NOTE: This sheet is a summary. It may not cover all possible information. If you have questions about this medicine, talk to your doctor, pharmacist, or health care  provider.  2023 Elsevier/Gold Standard (2021-05-02 00:00:00)

## 2022-09-20 NOTE — Progress Notes (Unsigned)
Hematology and Oncology Follow Up Visit  Randall Carter [id number] 22-Sep-1945 77 y.o. 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 daily-start on 09/14/2020 -- changed to 500 mg po q day on 12/11/2020 Xgeva 120 mg IM q. 3 months-next dose on 12/2022  Radiation therapy to the prostate bed -- completed on 09/21/2021     Interim History:  Randall Carter is back for his follow-up.  He is doing quite well.  He did have a PET scan done on 05/31/2022.  There is no evidence of any obvious metastatic disease.  However, there is still activity in the prostate.  This is a little bit troublesome as he has already had radiotherapy to the prostate.  His last PSA was less than 0.1.  His testosterone was less than 3.  He does have hot flashes.  Regard had to figure out how we can try to help this active area in the prostate.  I do not know if he might be considered for salvage surgery.  I think this might be reasonable considering the fact that he does not have any evidence of metastatic disease elsewhere.  I will have to speak with Urology about this.  He has had no cough or shortness of breath.  He has had no rashes.  Is been no bleeding.  Has had no leg swelling.  His appetite has been good.  He is trying to watch what he eats.  Overall, I would say that his performance status is probably ECOG 1.   Medications:  Current Outpatient Medications:    abiraterone acetate (ZYTIGA) 250 MG tablet, TAKE 2 TABLETS BY MOUTH DAILY ON AN EMPTY STOMACH 1 HOUR BEFORE  OR 2 HOURS AFTER A MEAL, Disp: 60 tablet, Rfl: 6   Accu-Chek FastClix Lancets MISC, Use one needle daily, Disp: , Rfl:    albuterol (VENTOLIN HFA) 108 (90 Base) MCG/ACT inhaler, Inhale 2 puffs into the lungs every 6 (six) hours as needed., Disp: 18 g, Rfl: 2   amoxicillin-clavulanate (AUGMENTIN) 875-125 MG tablet,  Take 1 tablet by mouth 2 (two) times daily., Disp: 20 tablet, Rfl: 0   aspirin 81 MG EC tablet, Take 81 mg by mouth., Disp: , Rfl:    atenolol (TENORMIN) 50 MG tablet, Take 1 tablet (50 mg total) by mouth daily., Disp: 90 tablet, Rfl: 3   benzonatate (TESSALON) 100 MG capsule, Take 1 capsule (100 mg total) by mouth 3 (three) times daily as needed for cough., Disp: 30 capsule, Rfl: 0   budesonide-formoterol (SYMBICORT) 160-4.5 MCG/ACT inhaler, Inhale 2 puffs into the lungs 2 (two) times daily., Disp: 1 each, Rfl: 12   Cholecalciferol 125 MCG (5000 UT) TABS, Take by mouth daily., Disp: , Rfl:    fexofenadine (ALLEGRA) 60 MG tablet, Take 60 mg by mouth daily., Disp: , Rfl:    fluticasone (FLONASE) 50 MCG/ACT nasal spray, Place 1 spray into both nostrils daily., Disp: , Rfl:    glucose blood test strip, Test glucose one time per day, Disp: , Rfl:    ipratropium (ATROVENT) 0.06 % nasal spray, Place 1 spray into both nostrils 4 (four) times daily., Disp: , Rfl:    montelukast (SINGULAIR) 10 MG tablet, Take 1 tablet (10 mg total) by mouth at bedtime., Disp: 30 tablet, Rfl: 3   Multiple Vitamin (MULTI-VITAMIN) tablet, Take 1 tablet by mouth daily., Disp: , Rfl:  naproxen (NAPROSYN) 500 MG tablet, Take 500 mg by mouth., Disp: , Rfl:    NIFEdipine (ADALAT CC) 60 MG 24 hr tablet, TAKE 1 TABLET(60 MG) BY MOUTH DAILY, Disp: 30 tablet, Rfl: 3   predniSONE (DELTASONE) 5 MG tablet, TAKE 1 TABLET(5 MG) BY MOUTH DAILY WITH BREAKFAST, Disp: 30 tablet, Rfl: 6   saxagliptin HCl (ONGLYZA) 2.5 MG TABS tablet, Take 1 tablet (2.5 mg total) by mouth daily., Disp: 30 tablet, Rfl: 11   simvastatin (ZOCOR) 40 MG tablet, Take 40 mg by mouth at bedtime., Disp: , Rfl:    vitamin B-12 (CYANOCOBALAMIN) 500 MCG tablet, Take by mouth daily., Disp: , Rfl:   Allergies:  Allergies  Allergen Reactions   Metformin And Related Diarrhea and Nausea And Vomiting   Tamsulosin Other (See Comments)    Past Medical History, Surgical  history, Social history, and Family History were reviewed and updated.  Review of Systems: Review of Systems  Constitutional: Negative.   HENT:  Negative.    Eyes: Negative.   Respiratory: Negative.    Cardiovascular: Negative.   Gastrointestinal: Negative.   Endocrine: Positive for hot flashes.  Genitourinary: Negative.    Musculoskeletal: Negative.   Skin: Negative.   Neurological: Negative.   Hematological: Negative.   Psychiatric/Behavioral: Negative.      Physical Exam:  vitals were not taken for this visit.   Wt Readings from Last 3 Encounters:  09/10/22 243 lb (110.2 kg)  07/19/22 238 lb 12.8 oz (108.3 kg)  06/21/22 239 lb 1.3 oz (108.4 kg)    Physical Exam Vitals reviewed.  HENT:     Head: Normocephalic and atraumatic.  Eyes:     Pupils: Pupils are equal, round, and reactive to light.  Cardiovascular:     Rate and Rhythm: Normal rate and regular rhythm.     Heart sounds: Normal heart sounds.  Pulmonary:     Effort: Pulmonary effort is normal.     Breath sounds: Normal breath sounds.  Abdominal:     General: Bowel sounds are normal.     Palpations: Abdomen is soft.  Musculoskeletal:        General: No tenderness or deformity. Normal range of motion.     Cervical back: Normal range of motion.  Lymphadenopathy:     Cervical: No cervical adenopathy.  Skin:    General: Skin is warm and dry.     Findings: No erythema or rash.  Neurological:     Mental Status: He is alert and oriented to person, place, and time.  Psychiatric:        Behavior: Behavior normal.        Thought Content: Thought content normal.        Judgment: Judgment normal.      Lab Results  Component Value Date   WBC 6.0 06/21/2022   HGB 12.8 (L) 06/21/2022   HCT 37.7 (L) 06/21/2022   MCV 98.7 06/21/2022   PLT 167 06/21/2022     Chemistry      Component Value Date/Time   NA 140 09/20/2022 0846   K 3.9 09/20/2022 0846   CL 106 09/20/2022 0846   CO2 24 09/20/2022 0846   BUN  21 09/20/2022 0846   CREATININE 1.07 09/20/2022 0846      Component Value Date/Time   CALCIUM 9.5 09/20/2022 0846   ALKPHOS 53 09/20/2022 0846   AST 17 09/20/2022 0846   ALT 15 09/20/2022 0846   BILITOT 0.6 09/20/2022 0846      Impression  and Plan: Mr. Achille is a very nice 77 year old African-American male.  He has castrate sensitive prostate cancer.  Again, he has some active areas in the prostate.  Again we will get have to figure out how we can try to help this.  I will have to speak with Urology.  I do not know if he would be a candidate for robotic surgery.  This might be difficult given that he is already had radiotherapy to the area.  I would not think he could have more radiation therapy.  I do not know this is something that Proton therapy could help with.  Again I do not think this is done around here.  He will go ahead and get his Rivka Barbara today.  He will get his Lupron today.  We will have to see him back in another 3 months.  Josph Macho, MD 7/19/20249:37 AM

## 2022-09-21 ENCOUNTER — Encounter: Payer: Self-pay | Admitting: Hematology & Oncology

## 2022-09-21 LAB — PSA, TOTAL AND FREE
PSA, Free Pct: UNDETERMINED %
PSA, Free: <0.02 ng/mL
Prostate Specific Ag, Serum: <0.1 ng/mL (ref 0.0–4.0)

## 2022-09-21 LAB — TESTOSTERONE: Testosterone: <3 ng/dL — ABNORMAL LOW (ref 264–916)

## 2022-10-01 ENCOUNTER — Ambulatory Visit (INDEPENDENT_AMBULATORY_CARE_PROVIDER_SITE_OTHER): Payer: Medicare Other | Admitting: *Deleted

## 2022-10-01 DIAGNOSIS — Z0000 Encounter for general adult medical examination without abnormal findings: Secondary | ICD-10-CM

## 2022-10-01 NOTE — Progress Notes (Signed)
Subjective:   Randall Carter is a 77 y.o. male who presents for Medicare Annual/Subsequent preventive examination.  Visit Complete: Virtual  I connected with  Randall Carter on 10/01/22 by a audio enabled telemedicine application and verified that I am speaking with the correct person using two identifiers.  Patient Location: Home  Provider Location: Office/Clinic  I discussed the limitations of evaluation and management by telemedicine. The patient expressed understanding and agreed to proceed.   Review of Systems     Cardiac Risk Factors include: diabetes mellitus;dyslipidemia;hypertension;male gender     Objective:    Vital Signs: Patient was unable to self-report vital signs via telehealth due to a lack of equipment at home.      10/01/2022    3:08 PM 09/20/2022    9:44 AM 06/21/2022    8:58 AM 03/22/2022    9:23 AM 12/20/2021    8:48 AM 09/19/2021    7:59 AM 06/20/2021    8:17 AM  Advanced Directives  Does Patient Have a Medical Advance Directive? No No No No No No No  Would patient like information on creating a medical advance directive? No - Patient declined No - Patient declined No - Patient declined No - Patient declined No - Patient declined No - Patient declined No - Patient declined    Current Medications (verified) Outpatient Encounter Medications as of 10/01/2022  Medication Sig   abiraterone acetate (ZYTIGA) 250 MG tablet TAKE 2 TABLETS BY MOUTH DAILY ON AN EMPTY STOMACH 1 HOUR BEFORE  OR 2 HOURS AFTER A MEAL   Accu-Chek FastClix Lancets MISC Use one needle daily   albuterol (VENTOLIN HFA) 108 (90 Base) MCG/ACT inhaler Inhale 2 puffs into the lungs every 6 (six) hours as needed.   aspirin 81 MG EC tablet Take 81 mg by mouth.   atenolol (TENORMIN) 50 MG tablet Take 1 tablet (50 mg total) by mouth daily.   benzonatate (TESSALON) 100 MG capsule Take 1 capsule (100 mg total) by mouth 3 (three) times daily as needed for cough.   budesonide-formoterol (SYMBICORT)  160-4.5 MCG/ACT inhaler Inhale 2 puffs into the lungs 2 (two) times daily.   fexofenadine (ALLEGRA) 60 MG tablet Take 60 mg by mouth daily.   fluticasone (FLONASE) 50 MCG/ACT nasal spray Place 1 spray into both nostrils daily.   glucose blood test strip Test glucose one time per day   ipratropium (ATROVENT) 0.06 % nasal spray Place 1 spray into both nostrils 4 (four) times daily.   loratadine (CLARITIN) 10 MG tablet Take 10 mg by mouth daily as needed.   Multiple Vitamin (MULTI-VITAMIN) tablet Take 1 tablet by mouth daily.   naproxen (NAPROSYN) 500 MG tablet Take 500 mg by mouth.   NIFEdipine (ADALAT CC) 60 MG 24 hr tablet TAKE 1 TABLET(60 MG) BY MOUTH DAILY   predniSONE (DELTASONE) 5 MG tablet TAKE 1 TABLET(5 MG) BY MOUTH DAILY WITH BREAKFAST   saxagliptin HCl (ONGLYZA) 2.5 MG TABS tablet Take 1 tablet (2.5 mg total) by mouth daily.   simvastatin (ZOCOR) 40 MG tablet Take 40 mg by mouth at bedtime.   vitamin B-12 (CYANOCOBALAMIN) 500 MCG tablet Take by mouth daily.   No facility-administered encounter medications on file as of 10/01/2022.    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 No pertinent surgical history. History reviewed. No pertinent family history. Social History   Socioeconomic History   Marital status: Widowed    Spouse name: Not on file   Number of children: Not on file   Years of education: Not on file   Highest education level: Not on file  Occupational History   Not on file  Tobacco Use   Smoking status: Never    Passive exposure: Never   Smokeless tobacco: Never  Vaping Use   Vaping status: Never Used  Substance and Sexual Activity   Alcohol use: Not Currently   Drug use: Not Currently   Sexual activity: Not  Currently  Other Topics Concern   Not on file  Social History Narrative   Not on file   Social Determinants of Health   Financial Resource Strain: Low Risk  (10/01/2022)   Overall Financial Resource Strain (CARDIA)    Difficulty of Paying Living Expenses: Not hard at all  Food Insecurity: No Food Insecurity (10/01/2022)   Hunger Vital Sign    Worried About Running Out of Food in the Last Year: Never true    Ran Out of Food in the Last Year: Never true  Transportation Needs: No Transportation Needs (10/01/2022)   PRAPARE - Administrator, Civil Service (Medical): No    Lack of Transportation (Non-Medical): No  Physical Activity: Insufficiently Active (10/01/2022)   Exercise Vital Sign    Days of Exercise per Week: 3 days    Minutes of Exercise per Session: 30 min  Stress: No Stress Concern Present (10/01/2022)   [employer] of Occupational Health - Occupational Stress Questionnaire    Feeling of Stress : Not at all  Social Connections: Moderately Integrated (10/01/2022)   Social Connection and Isolation Panel [NHANES]    Frequency of Communication with Friends and Family: Twice a week    Frequency of Social Gatherings with Friends and Family: Once a week    Attends Religious Services: More than 4 times per year    Active Member of [employer] or Organizations: No    Attends Engineer, structural: Never    Marital Status: Married    Tobacco Counseling Counseling given: Not Answered   Clinical Intake:  Pre-visit preparation completed: Yes  Pain : No/denies pain  Nutritional Risks: None Diabetes: Yes CBG done?: No Did pt. bring in CBG monitor from home?: No  How often do you need to have someone help you when you read instructions, pamphlets, or other written materials from your doctor or pharmacy?: 1 - Never  Interpreter Needed?: No  Information entered by :: Donne Anon, CMA   Activities of Daily Living    10/01/2022    3:01 PM  In your present  state of health, do you have any difficulty performing the following activities:  Hearing? 0  Vision? 0  Difficulty concentrating or making decisions? 0  Walking or climbing stairs? 0  Dressing or bathing? 0  Doing errands, shopping? 0  Preparing Food and eating ? N  Using the Toilet? N  In the past six months, have you accidently leaked urine? N  Do you have problems with loss of bowel control? N  Managing your Medications? N  Managing your Finances? N  Housekeeping or managing your Housekeeping? N    Patient Care Team: Saguier, Kateri Mc as PCP - General (Internal Medicine) Josph Macho, MD as Medical Oncologist (Oncology)  Indicate any recent Medical Services you may have received from  other than [facility] providers in the past year (date may be approximate).     Assessment:   This is a routine wellness examination for [place].  Hearing/Vision screen No results found.  Dietary issues and exercise activities discussed:     Goals Addressed   None    Depression Screen    10/01/2022    3:08 PM 11/22/2021   10:07 AM  PHQ 2/9 Scores  PHQ - 2 Score 0 0    Fall Risk    10/01/2022    3:04 PM 11/22/2021   10:08 AM  Fall Risk   Falls in the past year? 0 0  Number falls in past yr: 0 0  Injury with Fall? 0 0  Risk for fall due to : No Fall Risks No Fall Risks  Follow up Falls evaluation completed Falls evaluation completed    MEDICARE RISK AT HOME:  Medicare Risk at Home - 10/01/22 1503     Any stairs in or around the home? Yes    If so, are there any without handrails? No    Home free of loose throw rugs in walkways, pet beds, electrical cords, etc? Yes    Adequate lighting in your home to reduce risk of falls? Yes    Life alert? No    Use of a cane, walker or w/c? No    Grab bars in the bathroom? Yes    Shower chair or bench in shower? No    Elevated toilet seat or a handicapped toilet? No             TIMED UP AND GO:  Was the test performed?  No     Cognitive Function:        10/01/2022    3:12 PM  6CIT Screen  What Year? 0 points  What month? 0 points  What time? 0 points  Count back from 20 0 points  Months in reverse 0 points  Repeat phrase 6 points  Total Score 6 points    Immunizations Immunization History  Administered Date(s) Administered   Influenza Split 04/10/2015   Influenza, High Dose Seasonal PF 12/30/2013, 12/22/2015, 12/26/2016, 12/31/2017   Influenza,inj,quad, With Preservative 12/26/2018   Influenza-Unspecified 12/26/2018   Novel Infuenza-h1n1-09 03/25/2008   Pneumococcal Conjugate-13 01/26/2014   Pneumococcal Polysaccharide-23 05/11/2012   Td 10/26/2021   Td (Adult),5 Lf Tetanus Toxid, Preservative Free 07/15/2008   Tdap 10/26/2021   Zoster, Live 09/02/2008    TDAP status: Up to date  Flu Vaccine status: Up to date  Pneumococcal vaccine status: Up to date  Covid-19 vaccine status: Information provided on how to obtain vaccines.   Qualifies for Shingles Vaccine? Yes   Zostavax completed Yes   Shingrix Completed?: No.    Education has been provided regarding the importance of this vaccine. Patient has been advised to call insurance company to determine out of pocket expense if they have not yet received this vaccine. Advised may also receive vaccine at local pharmacy or Health Dept. Verbalized acceptance and understanding.  Screening Tests Health Maintenance  Topic Date Due   COVID-19 Vaccine (1) Never done   Diabetic kidney evaluation - Urine ACR  Never done   Hepatitis C Screening  Never done   Zoster Vaccines- Shingrix (1 of 2) 04/13/1966   Colonoscopy  Never done   Medicare Annual Wellness (AWV)  10/04/2021   INFLUENZA VACCINE  10/03/2022   Diabetic kidney evaluation - eGFR measurement  09/20/2023   DTaP/Tdap/Td (3 - Td or  Tdap) 10/27/2031   Pneumonia Vaccine 59+ Years old  Completed   HPV VACCINES  Aged Out    Health Maintenance  Health Maintenance Due  Topic Date Due    COVID-19 Vaccine (1) Never done   Diabetic kidney evaluation - Urine ACR  Never done   Hepatitis C Screening  Never done   Zoster Vaccines- Shingrix (1 of 2) 04/13/1966   Colonoscopy  Never done   Medicare Annual Wellness (AWV)  10/04/2021    Colorectal cancer screening: Type of screening: Colonoscopy. Completed 07/07/19. Repeat every 5 years  Lung Cancer Screening: (Low Dose CT Chest recommended if Age 68-80 years, 20 pack-year currently smoking OR have quit w/in 15years.) does not qualify.   Additional Screening:  Hepatitis C Screening: does qualify; Completed N/a  Vision Screening: Recommended annual ophthalmology exams for early detection of glaucoma and other disorders of the eye. Is the patient up to date with their annual eye exam?  Yes  Who is the provider or what is the name of the office in which the patient attends annual eye exams? Can't remember name at this time If pt is not established with a provider, would they like to be referred to a provider to establish care? No .   Dental Screening: Recommended annual dental exams for proper oral hygiene  Diabetic Foot Exam: Diabetic Foot Exam: Overdue, Pt has been advised about the importance in completing this exam. Pt is scheduled for diabetic foot exam on N/a.  Community Resource Referral / Chronic Care Management: CRR required this visit?  No   CCM required this visit?  No     Plan:     I have personally reviewed and noted the following in the patient's chart:   Medical and social history Use of alcohol, tobacco or illicit drugs  Current medications and supplements including opioid prescriptions. Patient is not currently taking opioid prescriptions. Functional ability and status Nutritional status Physical activity Advanced directives List of other physicians Hospitalizations, surgeries, and ER visits in previous 12 months Vitals Screenings to include cognitive, depression, and falls Referrals and  appointments  In addition, I have reviewed and discussed with patient certain preventive protocols, quality metrics, and best practice recommendations. A written personalized care plan for preventive services as well as general preventive health recommendations were provided to patient.     Donne Anon, CMA   10/01/2022   After Visit Summary: (MyChart) Due to this being a telephonic visit, the after visit summary with patients personalized plan was offered to patient via MyChart   Nurse Notes: None

## 2022-10-01 NOTE — Patient Instructions (Signed)
Randall Carter , Thank you for taking time to come for your Medicare Wellness Visit. I appreciate your ongoing commitment to your health goals. Please review the following plan we discussed and let me know if I can assist you in the future.   These are the goals we discussed:  Goals   None     This is a list of the screening recommended for you and due dates:  Health Maintenance  Topic Date Due   COVID-19 Vaccine (1) Never done   Yearly kidney health urinalysis for diabetes  Never done   Hepatitis C Screening  Never done   Zoster (Shingles) Vaccine (1 of 2) 04/13/1966   Colon Cancer Screening  Never done   Flu Shot  10/03/2022   Yearly kidney function blood test for diabetes  09/20/2023   Medicare Annual Wellness Visit  10/01/2023   DTaP/Tdap/Td vaccine (3 - Td or Tdap) 10/27/2031   Pneumonia Vaccine  Completed   HPV Vaccine  Aged Out     Next appointment: Follow up in one year for your annual wellness visit.   Preventive Care 27 Years and Older, Male Preventive care refers to lifestyle choices and visits with your health care provider that can promote health and wellness. What does preventive care include? A yearly physical exam. This is also called an annual well check. Dental exams once or twice a year. Routine eye exams. Ask your health care provider how often you should have your eyes checked. Personal lifestyle choices, including: Daily care of your teeth and gums. Regular physical activity. Eating a healthy diet. Avoiding tobacco and drug use. Limiting alcohol use. Practicing safe sex. Taking low doses of aspirin every day. Taking vitamin and mineral supplements as recommended by your health care provider. What happens during an annual well check? The services and screenings done by your health care provider during your annual well check will depend on your age, overall health, lifestyle risk factors, and family history of disease. Counseling  Your health care provider  may ask you questions about your: Alcohol use. Tobacco use. Drug use. Emotional well-being. Home and relationship well-being. Sexual activity. Eating habits. History of falls. Memory and ability to understand (cognition). Work and work Astronomer. Screening  You may have the following tests or measurements: Height, weight, and BMI. Blood pressure. Lipid and cholesterol levels. These may be checked every 5 years, or more frequently if you are over 71 years old. Skin check. Lung cancer screening. You may have this screening every year starting at age 79 if you have a 30-pack-year history of smoking and currently smoke or have quit within the past 15 years. Fecal occult blood test (FOBT) of the stool. You may have this test every year starting at age 12. Flexible sigmoidoscopy or colonoscopy. You may have a sigmoidoscopy every 5 years or a colonoscopy every 10 years starting at age 16. Prostate cancer screening. Recommendations will vary depending on your family history and other risks. Hepatitis C blood test. Hepatitis B blood test. Sexually transmitted disease (STD) testing. Diabetes screening. This is done by checking your blood sugar (glucose) after you have not eaten for a while (fasting). You may have this done every 1-3 years. Abdominal aortic aneurysm (AAA) screening. You may need this if you are a current or former smoker. Osteoporosis. You may be screened starting at age 76 if you are at high risk. Talk with your health care provider about your test results, treatment options, and if necessary, the need for  more tests. Vaccines  Your health care provider may recommend certain vaccines, such as: Influenza vaccine. This is recommended every year. Tetanus, diphtheria, and acellular pertussis (Tdap, Td) vaccine. You may need a Td booster every 10 years. Zoster vaccine. You may need this after age 62. Pneumococcal 13-valent conjugate (PCV13) vaccine. One dose is recommended after  age 73. Pneumococcal polysaccharide (PPSV23) vaccine. One dose is recommended after age 24. Talk to your health care provider about which screenings and vaccines you need and how often you need them. This information is not intended to replace advice given to you by your health care provider. Make sure you discuss any questions you have with your health care provider. Document Released: 03/17/2015 Document Revised: 11/08/2015 Document Reviewed: 12/20/2014 Elsevier Interactive Patient Education  2017 [employer].  Fall Prevention in the Home Falls can cause injuries. They can happen to people of all ages. There are many things you can do to make your home safe and to help prevent falls. What can I do on the outside of my home? Regularly fix the edges of walkways and driveways and fix any cracks. Remove anything that might make you trip as you walk through a door, such as a raised step or threshold. Trim any bushes or trees on the path to your home. Use bright outdoor lighting. Clear any walking paths of anything that might make someone trip, such as rocks or tools. Regularly check to see if handrails are loose or broken. Make sure that both sides of any steps have handrails. Any raised decks and porches should have guardrails on the edges. Have any leaves, snow, or ice cleared regularly. Use sand or salt on walking paths during winter. Clean up any spills in your garage right away. This includes oil or grease spills. What can I do in the bathroom? Use night lights. Install grab bars by the toilet and in the tub and shower. Do not use towel bars as grab bars. Use non-skid mats or decals in the tub or shower. If you need to sit down in the shower, use a plastic, non-slip stool. Keep the floor dry. Clean up any water that spills on the floor as soon as it happens. Remove soap buildup in the tub or shower regularly. Attach bath mats securely with double-sided non-slip rug tape. Do not have  throw rugs and other things on the floor that can make you trip. What can I do in the bedroom? Use night lights. Make sure that you have a light by your bed that is easy to reach. Do not use any sheets or blankets that are too big for your bed. They should not hang down onto the floor. Have a firm chair that has side arms. You can use this for support while you get dressed. Do not have throw rugs and other things on the floor that can make you trip. What can I do in the kitchen? Clean up any spills right away. Avoid walking on wet floors. Keep items that you use a lot in easy-to-reach places. If you need to reach something above you, use a strong step stool that has a grab bar. Keep electrical cords out of the way. Do not use floor polish or wax that makes floors slippery. If you must use wax, use non-skid floor wax. Do not have throw rugs and other things on the floor that can make you trip. What can I do with my stairs? Do not leave any items on the stairs. Make  sure that there are handrails on both sides of the stairs and use them. Fix handrails that are broken or loose. Make sure that handrails are as long as the stairways. Check any carpeting to make sure that it is firmly attached to the stairs. Fix any carpet that is loose or worn. Avoid having throw rugs at the top or bottom of the stairs. If you do have throw rugs, attach them to the floor with carpet tape. Make sure that you have a light switch at the top of the stairs and the bottom of the stairs. If you do not have them, ask someone to add them for you. What else can I do to help prevent falls? Wear shoes that: Do not have high heels. Have rubber bottoms. Are comfortable and fit you well. Are closed at the toe. Do not wear sandals. If you use a stepladder: Make sure that it is fully opened. Do not climb a closed stepladder. Make sure that both sides of the stepladder are locked into place. Ask someone to hold it for you, if  possible. Clearly mark and make sure that you can see: Any grab bars or handrails. First and last steps. Where the edge of each step is. Use tools that help you move around (mobility aids) if they are needed. These include: Canes. Walkers. Scooters. Crutches. Turn on the lights when you go into a dark area. Replace any light bulbs as soon as they burn out. Set up your furniture so you have a clear path. Avoid moving your furniture around. If any of your floors are uneven, fix them. If there are any pets around you, be aware of where they are. Review your medicines with your doctor. Some medicines can make you feel dizzy. This can increase your chance of falling. Ask your doctor what other things that you can do to help prevent falls. This information is not intended to replace advice given to you by your health care provider. Make sure you discuss any questions you have with your health care provider. Document Released: 12/15/2008 Document Revised: 07/27/2015 Document Reviewed: 03/25/2014 Elsevier Interactive Patient Education  2017 [employer].

## 2022-11-11 ENCOUNTER — Other Ambulatory Visit: Payer: Self-pay | Admitting: Medical

## 2022-12-08 ENCOUNTER — Other Ambulatory Visit: Payer: Self-pay | Admitting: Hematology & Oncology

## 2022-12-08 ENCOUNTER — Other Ambulatory Visit: Payer: Self-pay | Admitting: Medical

## 2022-12-08 DIAGNOSIS — C7951 Secondary malignant neoplasm of bone: Secondary | ICD-10-CM

## 2022-12-09 ENCOUNTER — Other Ambulatory Visit: Payer: Self-pay | Admitting: Medical

## 2022-12-18 ENCOUNTER — Ambulatory Visit (INDEPENDENT_AMBULATORY_CARE_PROVIDER_SITE_OTHER): Payer: Medicare Other | Admitting: Medical

## 2022-12-18 ENCOUNTER — Encounter: Payer: Self-pay | Admitting: Medical

## 2022-12-18 ENCOUNTER — Other Ambulatory Visit: Payer: Self-pay | Admitting: Medical

## 2022-12-18 VITALS — BP 150/80 | HR 58 | Temp 98.0°F | Resp 18 | Ht 70.0 in | Wt 241.6 lb

## 2022-12-18 DIAGNOSIS — M940 Chondrocostal junction syndrome [Tietze]: Secondary | ICD-10-CM | POA: Diagnosis not present

## 2022-12-18 DIAGNOSIS — E785 Hyperlipidemia, unspecified: Secondary | ICD-10-CM

## 2022-12-18 DIAGNOSIS — E119 Type 2 diabetes mellitus without complications: Secondary | ICD-10-CM | POA: Diagnosis not present

## 2022-12-18 DIAGNOSIS — Z23 Encounter for immunization: Secondary | ICD-10-CM | POA: Diagnosis not present

## 2022-12-18 DIAGNOSIS — I10 Essential (primary) hypertension: Secondary | ICD-10-CM | POA: Diagnosis not present

## 2022-12-18 DIAGNOSIS — R0789 Other chest pain: Secondary | ICD-10-CM

## 2022-12-18 LAB — COMPREHENSIVE METABOLIC PANEL
ALT: 15 U/L (ref 0–53)
AST: 16 U/L (ref 0–37)
Albumin: 4.2 g/dL (ref 3.5–5.2)
Alkaline Phosphatase: 56 U/L (ref 39–117)
BUN: 20 mg/dL (ref 6–23)
CO2: 25 meq/L (ref 19–32)
Calcium: 9.3 mg/dL (ref 8.4–10.5)
Chloride: 106 meq/L (ref 96–112)
Creatinine, Ser: 1.12 mg/dL (ref 0.40–1.50)
GFR: 64.19 mL/min (ref >60.00)
Glucose, Bld: 149 mg/dL — ABNORMAL HIGH (ref 70–99)
Potassium: 3.9 meq/L (ref 3.5–5.1)
Sodium: 140 meq/L (ref 135–145)
Total Bilirubin: 0.6 mg/dL (ref 0.2–1.2)
Total Protein: 7.3 g/dL (ref 6.0–8.3)

## 2022-12-18 LAB — LIPID PANEL
Cholesterol: 282 mg/dL — ABNORMAL HIGH (ref 0–200)
HDL: 42.8 mg/dL (ref >39.00)
LDL Cholesterol: 214 mg/dL — ABNORMAL HIGH (ref 0–99)
NonHDL: 239.05
Total CHOL/HDL Ratio: 7
Triglycerides: 127 mg/dL (ref 0.0–149.0)
VLDL: 25.4 mg/dL (ref 0.0–40.0)

## 2022-12-18 LAB — HEMOGLOBIN A1C: Hgb A1c MFr Bld: 6.7 % — ABNORMAL HIGH (ref 4.6–6.5)

## 2022-12-18 LAB — MICROALBUMIN / CREATININE URINE RATIO
Creatinine,U: 186.9 mg/dL
Microalb Creat Ratio: 1.2 mg/g (ref 0.0–30.0)
Microalb, Ur: 2.2 mg/dL — ABNORMAL HIGH (ref 0.0–1.9)

## 2022-12-18 LAB — CBC WITH DIFFERENTIAL/PLATELET
Basophils Absolute: 0 10*3/uL (ref 0.0–0.1)
Basophils Relative: 0.6 % (ref 0.0–3.0)
Eosinophils Absolute: 0.2 10*3/uL (ref 0.0–0.7)
Eosinophils Relative: 2.4 % (ref 0.0–5.0)
HCT: 41.7 % (ref 39.0–52.0)
Hemoglobin: 13.6 g/dL (ref 13.0–17.0)
Lymphocytes Relative: 29.4 % (ref 12.0–46.0)
Lymphs Abs: 1.9 10*3/uL (ref 0.7–4.0)
MCHC: 32.5 g/dL (ref 30.0–36.0)
MCV: 99.8 fL (ref 78.0–100.0)
Monocytes Absolute: 0.6 10*3/uL (ref 0.1–1.0)
Monocytes Relative: 8.9 % (ref 3.0–12.0)
Neutro Abs: 3.8 10*3/uL (ref 1.4–7.7)
Neutrophils Relative %: 58.7 % (ref 43.0–77.0)
Platelets: 167 10*3/uL (ref 150.0–400.0)
RBC: 4.18 Mil/uL — ABNORMAL LOW (ref 4.22–5.81)
RDW: 13 % (ref 11.5–15.5)
WBC: 6.5 10*3/uL (ref 4.0–10.5)

## 2022-12-18 MED ORDER — LOSARTAN POTASSIUM 25 MG PO TABS: 25.0000 mg | ORAL_TABLET | Freq: Every day | ORAL | 0 refills | Status: DC

## 2022-12-18 NOTE — Addendum Note (Signed)
Addended by: Judieth Keens on: 12/18/2022 09:31 AM   Modules accepted: Orders

## 2022-12-18 NOTE — Progress Notes (Signed)
Subjective:    Patient ID: Randall Carter, male    DOB: 03-01-1946, 77 y.o.   MRN: [id number]  HPI Pt in for office visit on chronic medical problems.   Htn- bp initially elevated. Yesterday bp was 137/72 while was at home. Pt took bp med this morning. Pt is on atenolol and nifedipine.  Diabetes- last A1c was 8.4. Pt states in past metformin cause ha and nausea. Januvia was never authorized/not covered. Pt states has been using plantsulin over the counter. He states in morning sugars fasting 110-115. After lunch 140-145. After supper sugars 130-135. Taking plantuslin 1 tab twice daily. He states using this for 1.5 months. No side effects. He states he has stricter diet/lower sugar most of time now.  High cholesterol- on simvastain.   On review pt has seen Dr. Margo Aye urologist recently.  Interval Since Radiation; 14 months(hx of prostate cancer)  Randall Carter returns today for routine follow up after treatment for prostate cancer. He is doing well, but still bothered by hot flashes- particularly bothers him at night. He tried gabapentin without relief.  He continues to receive treatment with Dr. Myna Hidalgo, PSA has remained <0.1 He had a PSMA PET scan in March which showed uptake in the prostate gland. He was sent to [facility] for consideration of cryotherapy. He had a repeat prostate biopsy, which fortunately did not show viable prostate cancer, so cryotherapy was not recommended.  He has no complaints related to his urination. He wakes about 2 times at night to urinate. IPSS score is 4. He has no dysuria, hematuria or incontinence. He has no rectal pain, bleeding or diarrhea. Bowels are normal. He has no bone pain.  He has erectile dysfunction.   Pt being followed by Dr Myna Hidalgo. Pt states Dr. Myna Hidalgo is going to get PET Scan.  Pt will get shingrix vaccine thru pharmacy. Pt advised.  Pt got flu vaccine today.  One month ago he lifted 80 lb box on his porch. He states later I week pt had pain  most on rt side of chest. No dyspnea. Pain did respond some to alleve. Pain has overall subided and is dull. At one was in middle and left sided about 2 weeks. Not presenlty in middle and left. In morning rt side chest pain is more prominent. Early on noted features of hurting when he took deep breath.   Review of Systems  Constitutional:  Negative for chills, diaphoresis and fatigue.  Respiratory:  Negative for cough, chest tightness, shortness of breath and wheezing.   Cardiovascular:  Negative for chest pain and palpitations.  Gastrointestinal:  Negative for abdominal pain.  Genitourinary:  Negative for dysuria.  Musculoskeletal:  Negative for myalgias.       Rt side costochondral junction tenderness to palpation  Neurological:  Negative for dizziness, syncope, weakness and light-headedness.  Hematological:  Negative for adenopathy. Does not bruise/bleed easily.  Psychiatric/Behavioral:  Negative for behavioral problems and decreased concentration.    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     Social History   Socioeconomic History   Marital status: Widowed    Spouse name: Not on file   Number of children: Not on file   Years of education: Not on file   Highest education level: Not on file  Occupational History   Not on file  Tobacco Use  Smoking status: Never    Passive exposure: Never   Smokeless tobacco: Never  Vaping Use   Vaping status: Never Used  Substance and Sexual Activity   Alcohol use: Not Currently   Drug use: Not Currently   Sexual activity: Not Currently  Other Topics Concern   Not on file  Social History Narrative   Not on file   Social Determinants of Health   Financial Resource Strain: Low Risk  (10/01/2022)   Overall Financial Resource Strain (CARDIA)    Difficulty of Paying Living Expenses:  Not hard at all  Food Insecurity: No Food Insecurity (10/01/2022)   Hunger Vital Sign    Worried About Running Out of Food in the Last Year: Never true    Ran Out of Food in the Last Year: Never true  Transportation Needs: No Transportation Needs (10/01/2022)   PRAPARE - Administrator, Civil Service (Medical): No    Lack of Transportation (Non-Medical): No  Physical Activity: Insufficiently Active (10/01/2022)   Exercise Vital Sign    Days of Exercise per Week: 3 days    Minutes of Exercise per Session: 30 min  Stress: No Stress Concern Present (10/01/2022)   [employer] of Occupational Health - Occupational Stress Questionnaire    Feeling of Stress : Not at all  Social Connections: Moderately Integrated (10/01/2022)   Social Connection and Isolation Panel [NHANES]    Frequency of Communication with Friends and Family: Twice a week    Frequency of Social Gatherings with Friends and Family: Once a week    Attends Religious Services: More than 4 times per year    Active Member of [employer] or Organizations: No    Attends Banker Meetings: Never    Marital Status: Married  Catering manager Violence: Not At Risk (10/01/2022)   Humiliation, Afraid, Rape, and Kick questionnaire    Fear of Current or Ex-Partner: No    Emotionally Abused: No    Physically Abused: No    Sexually Abused: No    No past surgical history on file.  No family history on file.  Allergies  Allergen Reactions   Metformin And Related Diarrhea and Nausea And Vomiting   Tamsulosin Other (See Comments)    Current Outpatient Medications on File Prior to Visit  Medication Sig Dispense Refill   abiraterone acetate (ZYTIGA) 250 MG tablet TAKE 2 TABLETS BY MOUTH DAILY ON AN EMPTY STOMACH 1 HOUR BEFORE  OR 2 HOURS AFTER A MEAL 60 tablet 6   Accu-Chek FastClix Lancets MISC Use one needle daily     albuterol (VENTOLIN HFA) 108 (90 Base) MCG/ACT inhaler Inhale 2 puffs into the lungs every 6 (six)  hours as needed. 18 g 2   aspirin 81 MG EC tablet Take 81 mg by mouth.     atenolol (TENORMIN) 50 MG tablet TAKE 1 TABLET(50 MG) BY MOUTH DAILY 90 tablet 0   budesonide-formoterol (SYMBICORT) 160-4.5 MCG/ACT inhaler Inhale 2 puffs into the lungs 2 (two) times daily. 1 each 12   fexofenadine (ALLEGRA) 60 MG tablet Take 60 mg by mouth daily.     fluticasone (FLONASE) 50 MCG/ACT nasal spray Place 1 spray into both nostrils daily.     glucose blood test strip Test glucose one time per day     ipratropium (ATROVENT) 0.06 % nasal spray Place 1 spray into both nostrils 4 (four) times daily.     loratadine (CLARITIN) 10 MG tablet Take 10 mg by mouth daily as needed.  Multiple Vitamin (MULTI-VITAMIN) tablet Take 1 tablet by mouth daily.     naproxen (NAPROSYN) 500 MG tablet Take 500 mg by mouth.     NIFEdipine (ADALAT CC) 60 MG 24 hr tablet TAKE 1 TABLET(60 MG) BY MOUTH DAILY 90 tablet 0   predniSONE (DELTASONE) 5 MG tablet TAKE 1 TABLET(5 MG) BY MOUTH DAILY WITH BREAKFAST 30 tablet 6   simvastatin (ZOCOR) 40 MG tablet Take 40 mg by mouth at bedtime.     vitamin B-12 (CYANOCOBALAMIN) 500 MCG tablet Take by mouth daily.     No current facility-administered medications on file prior to visit.    BP (!) 150/80   Pulse (!) 58   Temp 98 F (36.7 C)   Resp 18   Ht 5\' 10"  (1.778 m)   Wt 241 lb 9.6 oz (109.6 kg)   SpO2 97%   BMI 34.67 kg/m        Objective:   Physical Exam  General Mental Status- Alert. General Appearance- Not in acute distress.   Skin General: Color- Normal Color. Moisture- Normal Moisture.  Neck Carotid Arteries- Normal color. Moisture- Normal Moisture. No carotid bruits. No JVD.  Chest and Lung Exam Auscultation: Breath Sounds:-Normal.  Cardiovascular Auscultation:Rythm- Regular. Murmurs & Other Heart Sounds:Auscultation of the heart reveals- No Murmurs.  Abdomen Inspection:-Inspeection Normal. Palpation/Percussion:Note:No mass. Palpation and Percussion of  the abdomen reveal- Non Tender, Non Distended + BS, no rebound or guarding.    Neurologic Cranial Nerve exam:- CN III-XII intact(No nystagmus), symmetric smile. Strength:- 5/5 equal and symmetric strength both upper and lower extremities.   Lower ext- calfs symmetric. No pedal edema. Negative hoamans signs.  Anterior chest- rt sided costochondral junction tenderness. No mid or left sided pain on palpation.    Assessment & Plan:   Patient Instructions  1. Controlled type 2 diabetes mellitus without complication, without long-term current use of insulin (HCC) -follow labs. Well see if with diet and your otc med adequate to control? If not then will rx other med. - Hemoglobin A1c - Urine Microalbumin w/creat. ratio - CBC with Differential/Platelet - Comprehensive metabolic panel  2. Hyperlipidemia, unspecified hyperlipidemia type -lipid panel - CBC with Differential/Platelet  3. Hypertension, unspecified type -bp high today. Add on losartan 25 mg daily - CBC with Differential/Platelet - Comprehensive metabolic panel - Lipid panel  4. Costochondritis -tylenol presenlty for pain. If bp better controlled with add on losartan then can restart low dose ibuprofen or allerver  5. Atypical chest pain -at one point had mid lower chest and let side pain mild 2 weeks ago. Non since. EKG showed sinus rhythm wid nonspecific wide qrs. No ischemia seen. -with cardiac risk factors will refer to cardiologist for caution sake. If you experience mid or left sided pain during interim pending cardiolgist appointment be seen in the ED - EKG 12-Lead   Follow up date to be determined after lab review. Probably advise 2 weeks or sooner if needed   Esperanza Richters, PA-C   Time spent with patient today was 42  minutes which consisted of chart revdiew, discussing diagnosis, work up treatment and documentation. Time did not include time to do EKG.

## 2022-12-18 NOTE — Patient Instructions (Addendum)
1. Controlled type 2 diabetes mellitus without complication, without long-term current use of insulin (HCC) -follow labs. Well see if with diet and your otc med adequate to control? If not then will rx other med. - Hemoglobin A1c - Urine Microalbumin w/creat. ratio - CBC with Differential/Platelet - Comprehensive metabolic panel  2. Hyperlipidemia, unspecified hyperlipidemia type -lipid panel - CBC with Differential/Platelet  3. Hypertension, unspecified type -bp high today. Add on losartan 25 mg daily - CBC with Differential/Platelet - Comprehensive metabolic panel - Lipid panel  4. Costochondritis -tylenol presenlty for pain. If bp better controlled with add on losartan then can restart low dose ibuprofen or allerver  5. Atypical chest pain -at one point had mid lower chest and let side pain mild 2 weeks ago. Non since. EKG showed sinus rhythm wid nonspecific wide qrs. No ischemia seen. -with cardiac risk factors will refer to cardiologist for caution sake. If you experience mid or left sided pain during interim pending cardiolgist appointment be seen in the ED - EKG 12-Lead   Follow up date to be determined after lab review. Probably advise 2 weeks or sooner if needed

## 2022-12-20 ENCOUNTER — Inpatient Hospital Stay: Payer: 59

## 2022-12-20 ENCOUNTER — Inpatient Hospital Stay: Payer: 59 | Attending: Oncology

## 2022-12-20 ENCOUNTER — Telehealth: Payer: Self-pay

## 2022-12-20 ENCOUNTER — Inpatient Hospital Stay (HOSPITAL_BASED_OUTPATIENT_CLINIC_OR_DEPARTMENT_OTHER): Payer: 59 | Admitting: Hematology & Oncology

## 2022-12-20 ENCOUNTER — Encounter: Payer: Self-pay | Admitting: Hematology & Oncology

## 2022-12-20 VITALS — BP 141/70 | HR 58 | Temp 98.6°F | Resp 20 | Ht 70.0 in | Wt 242.0 lb

## 2022-12-20 DIAGNOSIS — C7951 Secondary malignant neoplasm of bone: Secondary | ICD-10-CM | POA: Insufficient documentation

## 2022-12-20 DIAGNOSIS — C61 Malignant neoplasm of prostate: Secondary | ICD-10-CM | POA: Diagnosis not present

## 2022-12-20 DIAGNOSIS — Z5111 Encounter for antineoplastic chemotherapy: Secondary | ICD-10-CM | POA: Diagnosis not present

## 2022-12-20 LAB — CBC WITH DIFFERENTIAL (CANCER CENTER ONLY)
Abs Immature Granulocytes: 0.02 10*3/uL (ref 0.00–0.07)
Basophils Absolute: 0 10*3/uL (ref 0.0–0.1)
Basophils Relative: 0 %
Eosinophils Absolute: 0.1 10*3/uL (ref 0.0–0.5)
Eosinophils Relative: 2 %
HCT: 39.3 % (ref 39.0–52.0)
Hemoglobin: 13.2 g/dL (ref 13.0–17.0)
Immature Granulocytes: 0 %
Lymphocytes Relative: 30 %
Lymphs Abs: 2 10*3/uL (ref 0.7–4.0)
MCH: 32.8 pg (ref 26.0–34.0)
MCHC: 33.6 g/dL (ref 30.0–36.0)
MCV: 97.5 fL (ref 80.0–100.0)
Monocytes Absolute: 0.7 10*3/uL (ref 0.1–1.0)
Monocytes Relative: 11 %
Neutro Abs: 3.8 10*3/uL (ref 1.7–7.7)
Neutrophils Relative %: 57 %
Platelet Count: 167 10*3/uL (ref 150–400)
RBC: 4.03 MIL/uL — ABNORMAL LOW (ref 4.22–5.81)
RDW: 12.3 % (ref 11.5–15.5)
WBC Count: 6.7 10*3/uL (ref 4.0–10.5)
nRBC: 0 % (ref 0.0–0.2)

## 2022-12-20 LAB — CMP (CANCER CENTER ONLY)
ALT: 14 U/L (ref 0–44)
AST: 16 U/L (ref 15–41)
Albumin: 4 g/dL (ref 3.5–5.0)
Alkaline Phosphatase: 51 U/L (ref 38–126)
Anion gap: 9 (ref 5–15)
BUN: 20 mg/dL (ref 8–23)
CO2: 25 mmol/L (ref 22–32)
Calcium: 9.2 mg/dL (ref 8.9–10.3)
Chloride: 105 mmol/L (ref 98–111)
Creatinine: 1.17 mg/dL (ref 0.61–1.24)
GFR, Estimated: >60 mL/min (ref >60)
Glucose, Bld: 146 mg/dL — ABNORMAL HIGH (ref 70–99)
Potassium: 4.3 mmol/L (ref 3.5–5.1)
Sodium: 139 mmol/L (ref 135–145)
Total Bilirubin: 0.6 mg/dL (ref 0.3–1.2)
Total Protein: 7.3 g/dL (ref 6.5–8.1)

## 2022-12-20 MED ORDER — DENOSUMAB 120 MG/1.7ML ~~LOC~~ SOLN
120.0000 mg | Freq: Once | SUBCUTANEOUS | Status: AC
  Administered 2022-12-20: 120 mg via SUBCUTANEOUS
  Filled 2022-12-20: qty 1.7

## 2022-12-20 MED ORDER — LEUPROLIDE ACETATE (3 MONTH) 22.5 MG ~~LOC~~ KIT
22.5000 mg | PACK | Freq: Once | SUBCUTANEOUS | Status: AC
  Administered 2022-12-20: 22.5 mg via SUBCUTANEOUS
  Filled 2022-12-20: qty 22.5

## 2022-12-20 NOTE — Progress Notes (Signed)
Hematology and Oncology Follow Up Visit  Randall Carter [id number] 13-Oct-1945 77 y.o. 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 daily-start on 09/14/2020 -- changed to 500 mg po q day on 12/11/2020 Xgeva 120 mg IM q. 3 months-next dose on 03/2023  Radiation therapy to the prostate bed -- completed on 09/21/2021     Interim History:  Randall Carter is back for his follow-up.  We last saw him 3 months ago.  Since then, he has been doing quite nicely.  He has had no problems with abdominal pain.  He has had no problems urinating.  He has had no cough or shortness of breath.  He has avoided COVID.  There is been no problems with rashes.  Thankfully, his PSA is still nondetectable.  His testosterone level is also quite low.  He does have some hot flashes.  He has had no bleeding.  He is doing well on the Zytiga.  He takes low-dose prednisone with this.  He has had no bony pain.  His appetite is doing quite well.  He is trying to watch his however.  He does not want to have diabetes.  Overall, I would say that his performance status is probably ECOG 0.   Medications:  Current Outpatient Medications:    abiraterone acetate (ZYTIGA) 250 MG tablet, TAKE 2 TABLETS BY MOUTH DAILY ON AN EMPTY STOMACH 1 HOUR BEFORE  OR 2 HOURS AFTER A MEAL, Disp: 60 tablet, Rfl: 6   Accu-Chek FastClix Lancets MISC, Use one needle daily, Disp: , Rfl:    albuterol (VENTOLIN HFA) 108 (90 Base) MCG/ACT inhaler, Inhale 2 puffs into the lungs every 6 (six) hours as needed., Disp: 18 g, Rfl: 2   aspirin 81 MG EC tablet, Take 81 mg by mouth., Disp: , Rfl:    atenolol (TENORMIN) 50 MG tablet, TAKE 1 TABLET(50 MG) BY MOUTH DAILY, Disp: 90 tablet, Rfl: 0   budesonide-formoterol (SYMBICORT) 160-4.5 MCG/ACT inhaler, Inhale 2 puffs into the lungs 2 (two) times daily., Disp: 1 each,  Rfl: 12   fexofenadine (ALLEGRA) 60 MG tablet, Take 60 mg by mouth daily., Disp: , Rfl:    glucose blood test strip, Test glucose one time per day, Disp: , Rfl:    ipratropium (ATROVENT) 0.06 % nasal spray, Place 1 spray into both nostrils 4 (four) times daily., Disp: , Rfl:    losartan (COZAAR) 25 MG tablet, Take 1 tablet (25 mg total) by mouth daily., Disp: 30 tablet, Rfl: 0   Multiple Vitamin (MULTI-VITAMIN) tablet, Take 1 tablet by mouth daily., Disp: , Rfl:    naproxen (NAPROSYN) 500 MG tablet, Take 500 mg by mouth daily as needed., Disp: , Rfl:    NIFEdipine (ADALAT CC) 60 MG 24 hr tablet, TAKE 1 TABLET(60 MG) BY MOUTH DAILY, Disp: 90 tablet, Rfl: 0   predniSONE (DELTASONE) 5 MG tablet, TAKE 1 TABLET(5 MG) BY MOUTH DAILY WITH BREAKFAST, Disp: 30 tablet, Rfl: 6   simvastatin (ZOCOR) 40 MG tablet, Take 40 mg by mouth at bedtime., Disp: , Rfl:    vitamin B-12 (CYANOCOBALAMIN) 500 MCG tablet, Take by mouth daily., Disp: , Rfl:    fluticasone (FLONASE) 50 MCG/ACT nasal spray, Place 1 spray into both nostrils daily. (Patient not taking: Reported on 12/20/2022), Disp: , Rfl:    loratadine (CLARITIN) 10 MG tablet, Take 10 mg by  mouth daily as needed. (Patient not taking: Reported on 12/20/2022), Disp: , Rfl:   Allergies:  Allergies  Allergen Reactions   Metformin And Related Diarrhea and Nausea And Vomiting   Tamsulosin Other (See Comments)    Past Medical History, Surgical history, Social history, and Family History were reviewed and updated.  Review of Systems: Review of Systems  Constitutional: Negative.   HENT:  Negative.    Eyes: Negative.   Respiratory: Negative.    Cardiovascular: Negative.   Gastrointestinal: Negative.   Endocrine: Positive for hot flashes.  Genitourinary: Negative.    Musculoskeletal: Negative.   Skin: Negative.   Neurological: Negative.   Hematological: Negative.   Psychiatric/Behavioral: Negative.      Physical Exam:  height is 5\' 10"  (1.778 m) and  weight is 242 lb 0.6 oz (109.8 kg). His oral temperature is 98.6 F (37 C). His blood pressure is 141/70 (abnormal) and his pulse is 58 (abnormal). His respiration is 20 and oxygen saturation is 99%.   Wt Readings from Last 3 Encounters:  12/20/22 242 lb 0.6 oz (109.8 kg)  12/18/22 241 lb 9.6 oz (109.6 kg)  09/20/22 247 lb (112 kg)    Physical Exam Vitals reviewed.  HENT:     Head: Normocephalic and atraumatic.  Eyes:     Pupils: Pupils are equal, round, and reactive to light.  Cardiovascular:     Rate and Rhythm: Normal rate and regular rhythm.     Heart sounds: Normal heart sounds.  Pulmonary:     Effort: Pulmonary effort is normal.     Breath sounds: Normal breath sounds.  Abdominal:     General: Bowel sounds are normal.     Palpations: Abdomen is soft.  Musculoskeletal:        General: No tenderness or deformity. Normal range of motion.     Cervical back: Normal range of motion.  Lymphadenopathy:     Cervical: No cervical adenopathy.  Skin:    General: Skin is warm and dry.     Findings: No erythema or rash.  Neurological:     Mental Status: He is alert and oriented to person, place, and time.  Psychiatric:        Behavior: Behavior normal.        Thought Content: Thought content normal.        Judgment: Judgment normal.      Lab Results  Component Value Date   WBC 6.7 12/20/2022   HGB 13.2 12/20/2022   HCT 39.3 12/20/2022   MCV 97.5 12/20/2022   PLT 167 12/20/2022     Chemistry      Component Value Date/Time   NA 140 12/18/2022 0910   K 3.9 12/18/2022 0910   CL 106 12/18/2022 0910   CO2 25 12/18/2022 0910   BUN 20 12/18/2022 0910   CREATININE 1.12 12/18/2022 0910   CREATININE 1.07 09/20/2022 0846      Component Value Date/Time   CALCIUM 9.3 12/18/2022 0910   ALKPHOS 56 12/18/2022 0910   AST 16 12/18/2022 0910   AST 17 09/20/2022 0846   ALT 15 12/18/2022 0910   ALT 15 09/20/2022 0846   BILITOT 0.6 12/18/2022 0910   BILITOT 0.6 09/20/2022 0846       Impression and Plan: Randall Carter is a very nice 77 year old African-American male.  He has castrate sensitive prostate cancer.  So far, everything is doing quite nicely.  He will get his Lupron and Xgeva today.  I really do not see  need for scans right now.  I think as long as his PSA and testosterone are low, I still think the scans will really help [place] out.  We will still follow him every 3 months.    Josph Macho, MD 10/18/20249:57 AM

## 2022-12-20 NOTE — Progress Notes (Signed)
BP, 141/70, instructed to monitor at home and notify PCP if it remains over 140/90. Has appointment with cardiology 01/09/23. Verbalized understanding.

## 2022-12-20 NOTE — Telephone Encounter (Signed)
Clinical Social Work was referred by Charity fundraiser regarding change in his insurance.  CSW attempted to contact patient by phone.  Left voicemail with contact information and request for return call.

## 2022-12-20 NOTE — Patient Instructions (Signed)
Leuprolide Solution for Injection What is this medication? LEUPROLIDE (loo PROE lide) reduces the symptoms of prostate cancer. It works by decreasing levels of the hormone testosterone in the body. This prevents prostate cancer cells from spreading or growing. This medicine may be used for other purposes; ask your health care provider or pharmacist if you have questions. COMMON BRAND NAME(S): Lupron What should I tell my care team before I take this medication? They need to know if you have any of these conditions: Diabetes Heart attack Heart disease High blood pressure High cholesterol Pain or difficulty passing urine Spinal cord metastasis Stroke Tobacco use An unusual or allergic reaction to leuprolide, other medications, foods, dyes, or preservatives Pregnant or trying to get pregnant Breast-feeding How should I use this medication? This medication is for injection under the skin or into a muscle. You will be taught how to prepare and give this medication. Use exactly as directed. Take your medication at regular intervals. Do not take it more often than directed. It is important that you put your used needles and syringes in a special sharps container. Do not put them in a trash can. If you do not have a sharps container, call your care team to get one. A special MedGuide will be given to you by the pharmacist with each prescription and refill. Be sure to read this information carefully each time. Talk to your care team about the use of this medication in children. While this medication may be prescribed for children as young as 8 years for selected conditions, precautions do apply. Overdosage: If you think you have taken too much of this medicine contact a poison control center or emergency room at once. NOTE: This medicine is only for you. Do not share this medicine with others. What if I miss a dose? If you miss a dose, take it as soon as you can. If it is almost time for your next  dose, take only that dose. Do not take double or extra doses. What may interact with this medication? Do not take this medication with any of the following: Chasteberry Cisapride Dronedarone Pimozide Thioridazine This medication may also interact with the following: Estrogen or progestin hormones Herbal or dietary supplements, like black cohosh or DHEA Other medications that cause heart rhythm changes Testosterone This list may not describe all possible interactions. Give your health care provider a list of all the medicines, herbs, non-prescription drugs, or dietary supplements you use. Also tell them if you smoke, drink alcohol, or use illegal drugs. Some items may interact with your medicine. What should I watch for while using this medication? Visit your care team for regular checks on your progress. During the first week, your symptoms may get worse, but then will improve as you continue your treatment. You may get hot flashes, increased bone pain, increased difficulty passing urine, or an aggravation of nerve symptoms. Discuss these effects with your care team, some of them may improve with continued use of this medication. Patients may experience a menstrual cycle or spotting during the first 2 months of therapy with this medication. If this continues, contact your care team. This medication may increase blood sugar. The risk may be higher in patients who already have diabetes. Ask your care team what you can do to lower your risk of diabetes while taking this medication. What side effects may I notice from receiving this medication? Side effects that you should report to your care team as soon as possible: Allergic reactions--skin rash,  itching, hives, swelling of the face, lips, tongue, or throat Heart attack--pain or tightness in the chest, shoulders, arms, or jaw, nausea, shortness of breath, cold or clammy skin, feeling faint or lightheaded Heart rhythm changes--fast or irregular  heartbeat, dizziness, feeling faint or lightheaded, chest pain, trouble breathing High blood sugar (hyperglycemia)--increased thirst or amount of urine, unusual weakness or fatigue, blurry vision New or worsening seizures Redness, blistering, peeling, or loosening of the skin, including inside the mouth Stroke--sudden numbness or weakness of the face, arm, or leg, trouble speaking, confusion, trouble walking, loss of balance or coordination, dizziness, severe headache, change in vision Swelling and pain of the tumor site or lymph nodes Side effects that usually do not require medical attention (report these to your care team if they continue or are bothersome): Change in sex drive or performance Hot flashes Joint pain Pain, redness, or irritation at injection site Swelling of the ankles, hands, or feet Unusual weakness or fatigue This list may not describe all possible side effects. Call your doctor for medical advice about side effects. You may report side effects to FDA at 1-800-FDA-1088. Where should I keep my medication? Keep out of the reach of children and pets. Store below 25 degrees C (77 degrees F). Do not freeze. Protect from light. Get rid of any unused medication after the expiration date. To get rid of medications that are no longer needed or have expired: Take the medication to a medication take-back program. Check with your pharmacy or law enforcement to find a location. If you cannot return the medication, ask your pharmacist or care team how to get rid of this medication safely. NOTE: This sheet is a summary. It may not cover all possible information. If you have questions about this medicine, talk to your doctor, pharmacist, or health care provider.  2024 Elsevier/Gold Standard (2022-07-09 00:00:00) Denosumab Injection (Oncology) What is this medication? DENOSUMAB (den oh SUE mab) prevents weakened bones caused by cancer. It may also be used to treat noncancerous bone  tumors that cannot be removed by surgery. It can also be used to treat high calcium levels in the blood caused by cancer. It works by blocking a protein that causes bones to break down quickly. This slows down the release of calcium from bones, which lowers calcium levels in your blood. It also makes your bones stronger and less likely to break (fracture). This medicine may be used for other purposes; ask your health care provider or pharmacist if you have questions. COMMON BRAND NAME(S): XGEVA What should I tell my care team before I take this medication? They need to know if you have any of these conditions: Dental disease Having surgery or tooth extraction Infection Kidney disease Low levels of calcium or vitamin D in the blood Malnutrition On hemodialysis Skin conditions or sensitivity Thyroid or parathyroid disease An unusual reaction to denosumab, other medications, foods, dyes, or preservatives Pregnant or trying to get pregnant Breast-feeding How should I use this medication? This medication is for injection under the skin. It is given by your care team in a hospital or clinic setting. A special MedGuide will be given to you before each treatment. Be sure to read this information carefully each time. Talk to your care team about the use of this medication in children. While it may be prescribed for children as young as 13 years for selected conditions, precautions do apply. Overdosage: If you think you have taken too much of this medicine contact a poison control center  or emergency room at once. NOTE: This medicine is only for you. Do not share this medicine with others. What if I miss a dose? Keep appointments for follow-up doses. It is important not to miss your dose. Call your care team if you are unable to keep an appointment. What may interact with this medication? Do not take this medication with any of the following: Other medications containing denosumab This medication  may also interact with the following: Medications that lower your chance of fighting infection Steroid medications, such as prednisone or cortisone This list may not describe all possible interactions. Give your health care provider a list of all the medicines, herbs, non-prescription drugs, or dietary supplements you use. Also tell them if you smoke, drink alcohol, or use illegal drugs. Some items may interact with your medicine. What should I watch for while using this medication? Your condition will be monitored carefully while you are receiving this medication. You may need blood work while taking this medication. This medication may increase your risk of getting an infection. Call your care team for advice if you get a fever, chills, sore throat, or other symptoms of a cold or flu. Do not treat yourself. Try to avoid being around people who are sick. You should make sure you get enough calcium and vitamin D while you are taking this medication, unless your care team tells you not to. Discuss the foods you eat and the vitamins you take with your care team. Some people who take this medication have severe bone, joint, or muscle pain. This medication may also increase your risk for jaw problems or a broken thigh bone. Tell your care team right away if you have severe pain in your jaw, bones, joints, or muscles. Tell your care team if you have any pain that does not go away or that gets worse. Talk to your care team if you may be pregnant. Serious birth defects can occur if you take this medication during pregnancy and for 5 months after the last dose. You will need a negative pregnancy test before starting this medication. Contraception is recommended while taking this medication and for 5 months after the last dose. Your care team can help you find the option that works for you. What side effects may I notice from receiving this medication? Side effects that you should report to your care team as soon  as possible: Allergic reactions--skin rash, itching, hives, swelling of the face, lips, tongue, or throat Bone, joint, or muscle pain Low calcium level--muscle pain or cramps, confusion, tingling, or numbness in the hands or feet Osteonecrosis of the jaw--pain, swelling, or redness in the mouth, numbness of the jaw, poor healing after dental work, unusual discharge from the mouth, visible bones in the mouth Side effects that usually do not require medical attention (report to your care team if they continue or are bothersome): Cough Diarrhea Fatigue Headache Nausea This list may not describe all possible side effects. Call your doctor for medical advice about side effects. You may report side effects to FDA at 1-800-FDA-1088. Where should I keep my medication? This medication is given in a hospital or clinic. It will not be stored at home. NOTE: This sheet is a summary. It may not cover all possible information. If you have questions about this medicine, talk to your doctor, pharmacist, or health care provider.  2024 Elsevier/Gold Standard (2021-07-11 00:00:00)

## 2022-12-21 LAB — PSA, TOTAL AND FREE
PSA, Free Pct: UNDETERMINED %
PSA, Free: <0.02 ng/mL
Prostate Specific Ag, Serum: <0.1 ng/mL (ref 0.0–4.0)

## 2022-12-21 LAB — TESTOSTERONE: Testosterone: <3 ng/dL — ABNORMAL LOW (ref 264–916)

## 2022-12-23 ENCOUNTER — Telehealth: Payer: Self-pay

## 2022-12-23 NOTE — Telephone Encounter (Signed)
Advised via MyChart.

## 2022-12-23 NOTE — Telephone Encounter (Signed)
-----   Message from Josph Macho sent at 12/23/2022  5:38 AM EDT ----- Call - the PSA is still not detectable!!  Cindee Lame

## 2023-01-05 ENCOUNTER — Other Ambulatory Visit: Payer: Self-pay | Admitting: Medical

## 2023-01-08 ENCOUNTER — Other Ambulatory Visit: Payer: Self-pay

## 2023-01-09 ENCOUNTER — Ambulatory Visit: Payer: Medicare Other | Attending: Cardiology | Admitting: Cardiology

## 2023-01-09 ENCOUNTER — Encounter: Payer: Self-pay | Admitting: Cardiology

## 2023-01-09 VITALS — BP 122/62 | HR 58 | Ht 70.6 in | Wt 239.1 lb

## 2023-01-09 DIAGNOSIS — C61 Malignant neoplasm of prostate: Secondary | ICD-10-CM | POA: Diagnosis not present

## 2023-01-09 DIAGNOSIS — E66811 Obesity, class 1: Secondary | ICD-10-CM

## 2023-01-09 DIAGNOSIS — E088 Diabetes mellitus due to underlying condition with unspecified complications: Secondary | ICD-10-CM

## 2023-01-09 DIAGNOSIS — E785 Hyperlipidemia, unspecified: Secondary | ICD-10-CM | POA: Diagnosis not present

## 2023-01-09 DIAGNOSIS — E7849 Other hyperlipidemia: Secondary | ICD-10-CM | POA: Insufficient documentation

## 2023-01-09 DIAGNOSIS — I10 Essential (primary) hypertension: Secondary | ICD-10-CM

## 2023-01-09 DIAGNOSIS — R079 Chest pain, unspecified: Secondary | ICD-10-CM

## 2023-01-09 HISTORY — DX: Diabetes mellitus due to underlying condition with unspecified complications: E08.8

## 2023-01-09 HISTORY — DX: Other hyperlipidemia: E78.49

## 2023-01-09 HISTORY — DX: Essential (primary) hypertension: I10

## 2023-01-09 HISTORY — DX: Malignant neoplasm of prostate: C61

## 2023-01-09 HISTORY — DX: Obesity, class 1: E66.811

## 2023-01-09 MED ORDER — LOSARTAN POTASSIUM 50 MG PO TABS: 50.0000 mg | ORAL_TABLET | Freq: Every day | ORAL | 3 refills | Status: DC

## 2023-01-09 NOTE — Progress Notes (Signed)
Cardiology Office Note:    Date:  01/09/2023   ID:  Randall Carter, DOB 09/23/1945, MRN [id number]  PCP:  Esperanza Richters, PA-C  Cardiologist:  Garwin Brothers, MD   Referring MD: Esperanza Richters, PA-C    ASSESSMENT:    1. Hyperlipidemia, unspecified hyperlipidemia type   2. Essential hypertension   3. Diabetes mellitus due to underlying condition with unspecified complications (HCC)   4. Prostate cancer (HCC)   5. Obesity (BMI 30.0-34.9)   6. Familial hyperlipidemia    PLAN:    In order of problems listed above:  Primary prevention stressed with the patient.  Importance of compliance with diet medication stressed and patient verbalized standing. Chest pain: Atypical in nature.  His effort tolerance is good.  He is concerned about it and we will do exercise stress echo.  Hopefully this will also give [place] some idea about his cardiac anatomy including left ventricular thickness and the possibility of any significant valvular issues.  He is agreeable. Family hyperlipidemia: On statin therapy.  Diet emphasized I discussed calcium scoring to better assess his target for lipid-lowering therapy.  He is agreeable. Diabetes mellitus and obesity: Lifestyle modification stressed diet emphasized and he promises to do better. Essential hypertension: I discontinued nifedipine.  I double his Cozaar.  He will be back in 2 weeks for pulse blood pressure check and a Chem-7.  He will keep a blood pressure log. Patient will be seen in follow-up appointment in 6 months or earlier if the patient has any concerns.    Medication Adjustments/Labs and Tests Ordered: Current medicines are reviewed at length with the patient today.  Concerns regarding medicines are outlined above.  Orders Placed This Encounter  Procedures   EKG 12-Lead   No orders of the defined types were placed in this encounter.    History of Present Illness:    Randall Carter is a 77 y.o. male who is being seen today for the  evaluation of chest pain at the request of [place], Ramon Dredge, [place].  Patient is a pleasant 77 year old male.  He has past medical history of prostate cancer.  He is undergoing hormonal therapy and tells me that he is cancer free at this time.  He has history of essential hypertension dyslipidemia and diabetes mellitus.  He is LDL is markedly elevated.  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  He is on statin therapy.  He and his wife mentioned to me that he has chest pain and he lifted a very heavy box.  He did not know that the box was that heavy and subsequently has had chest pain.  It is getting better.  It happens more often when he wakes up but gets better during the day.  He started walking about 30 minutes recently without any symptoms.  At the time of my evaluation, the patient is alert awake oriented and in no distress.  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   Prostate cancer metastatic to multiple sites [facility]) 07/27/2020   Receiving treatment at [facility] in [facility]     Thrombocytopenia [facility]) 03/26/2020   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   aspirin 81 MG EC tablet Take 81 mg by mouth daily.   atenolol (TENORMIN) 50 MG tablet TAKE 1 TABLET(50 MG) BY MOUTH DAILY   budesonide-formoterol (SYMBICORT) 160-4.5 MCG/ACT inhaler Inhale 2 puffs into the lungs 2 (two) times daily.   fexofenadine (ALLEGRA) 60 MG tablet Take 60 mg by mouth daily.   ipratropium (ATROVENT) 0.06 % nasal spray Place 1 spray into both nostrils 4 (four) times daily.   losartan (COZAAR) 25 MG tablet Take 1 tablet (25 mg total) by mouth daily.   Multiple Vitamin (MULTI-VITAMIN) tablet Take 1 tablet by mouth daily.   naproxen (NAPROSYN) 500 MG tablet Take 500 mg by mouth daily as needed for mild pain (pain score 1-3) or moderate pain (pain score 4-6).   NIFEdipine (ADALAT CC) 60 MG 24 hr tablet TAKE 1 TABLET(60 MG) BY MOUTH DAILY   predniSONE (DELTASONE) 5 MG tablet TAKE 1 TABLET(5 MG) BY MOUTH DAILY WITH BREAKFAST   simvastatin (ZOCOR) 40 MG tablet Take 40 mg by mouth at bedtime.   vitamin B-12 (CYANOCOBALAMIN) 500 MCG tablet Take 500 mcg by mouth daily.     Allergies:   Metformin and related and Tamsulosin   Social History   Socioeconomic History   Marital status: Married    Spouse name: Not on file   Number of children: Not on file   Years of education: Not on file   Highest education level: Not on file  Occupational History   Not on file  Tobacco Use   Smoking status: Never    Passive exposure: Never   Smokeless tobacco: Never  Vaping Use   Vaping status: Never Used  Substance and Sexual Activity   Alcohol use: Not Currently   Drug use: Not Currently   Sexual activity: Not Currently  Other Topics Concern   Not on file  Social History Narrative   Not on file   Social  Determinants of Health   Financial Resource Strain: Low Risk  (10/01/2022)   Overall Financial Resource Strain (CARDIA)    Difficulty of Paying Living Expenses: Not hard at all  Food Insecurity: No Food Insecurity (10/01/2022)   Hunger Vital Sign    Worried About Running Out of Food in the Last Year: Never true    Ran Out of Food in the Last Year: Never true  Transportation Needs: No Transportation Needs (10/01/2022)   PRAPARE - Administrator, Civil Service (Medical): No    Lack of Transportation (Non-Medical): No  Physical Activity: Insufficiently Active (10/01/2022)   Exercise Vital Sign    Days of Exercise per Week: 3 days    Minutes of Exercise per Session: 30 min  Stress: No Stress Concern Present (10/01/2022)   [employer]  of Occupational Health - Occupational Stress Questionnaire    Feeling of Stress : Not at all  Social Connections: Moderately Integrated (10/01/2022)   Social Connection and Isolation Panel [NHANES]    Frequency of Communication with Friends and Family: Twice a week    Frequency of Social Gatherings with Friends and Family: Once a week    Attends Religious Services: More than 4 times per year    Active Member of [employer] or Organizations: No    Attends Banker Meetings: Never    Marital Status: Married     Family History: The patient's family history includes Diabetes in his mother; Hypertension in his father. There is no history of Heart disease or Cancer.  ROS:   Please see the history of present illness.    All other systems reviewed and are negative.  EKGs/Labs/Other Studies Reviewed:    The following studies were reviewed today: .[name]   EKG reveals sinus bradycardia LVH T wave inversions in inferior leads and nonspecific ST-T changes       Recent Labs: 12/20/2022: ALT 14; BUN 20; Creatinine 1.17; Hemoglobin 13.2; Platelet Count 167; Potassium 4.3; Sodium 139  Recent Lipid Panel    Component Value Date/Time   CHOL 282  (H) 12/18/2022 0910   TRIG 127.0 12/18/2022 0910   HDL 42.80 12/18/2022 0910   CHOLHDL 7 12/18/2022 0910   VLDL 25.4 12/18/2022 0910   LDLCALC 214 (H) 12/18/2022 0910    Physical Exam:    VS:  BP 122/62   Pulse (!) 58   Ht 5' 10.6" (1.793 m)   Wt 239 lb 1.9 oz (108.5 kg)   SpO2 95%   BMI 33.73 kg/m     Wt Readings from Last 3 Encounters:  01/09/23 239 lb 1.9 oz (108.5 kg)  12/20/22 242 lb 0.6 oz (109.8 kg)  12/18/22 241 lb 9.6 oz (109.6 kg)     GEN: Patient is in no acute distress HEENT: Normal NECK: No JVD; No carotid bruits LYMPHATICS: No lymphadenopathy CARDIAC: S1 S2 regular, 2/6 systolic murmur at the apex. RESPIRATORY:  Clear to auscultation without rales, wheezing or rhonchi  ABDOMEN: Soft, non-tender, non-distended MUSCULOSKELETAL:  No edema; No deformity  SKIN: Warm and dry NEUROLOGIC:  Alert and oriented x 3 PSYCHIATRIC:  Normal affect    Signed, Garwin Brothers, MD  01/09/2023 2:18 PM    [facility]

## 2023-01-09 NOTE — Patient Instructions (Signed)
Please keep a BP log for 2 weeks and send by MyChart or mail.  Blood Pressure Record Sheet To take your blood pressure, you will need a blood pressure machine. You can buy a blood pressure machine (blood pressure monitor) at your clinic, drug store, or online. When choosing one, consider: An automatic monitor that has an arm cuff. A cuff that wraps snugly around your upper arm. You should be able to fit only one finger between your arm and the cuff. A device that stores blood pressure reading results. Do not choose a monitor that measures your blood pressure from your wrist or finger. Follow your health care provider's instructions for how to take your blood pressure. To use this form: Get one reading in the morning (a.m.) 1-2 hours after you take any medicines. Get one reading in the evening (p.m.) before supper.   Blood pressure log Date: _______________________  a.m. _____________________(1st reading) HR___________            p.m. _____________________(2nd reading) HR__________  Date: _______________________  a.m. _____________________(1st reading) HR___________            p.m. _____________________(2nd reading) HR__________  Date: _______________________  a.m. _____________________(1st reading) HR___________            p.m. _____________________(2nd reading) HR__________  Date: _______________________  a.m. _____________________(1st reading) HR___________            p.m. _____________________(2nd reading) HR__________  Date: _______________________  a.m. _____________________(1st reading) HR___________            p.m. _____________________(2nd reading) HR__________  Date: _______________________  a.m. _____________________(1st reading) HR___________            p.m. _____________________(2nd reading) HR__________  Date: _______________________  a.m. _____________________(1st reading) HR___________            p.m. _____________________(2nd reading)  HR__________   This information is not intended to replace advice given to you by your health care provider. Make sure you discuss any questions you have with your health care provider. Document Revised: 06/09/2019 Document Reviewed: 06/09/2019 Elsevier Patient Education  2021 Elsevier Inc.   Medication Instructions:  Your physician has recommended you make the following change in your medication:   Increase your Losartan to 50 mg daily  Stop Nifedipine   *If you need a refill on your cardiac medications before your next appointment, please call your pharmacy*   Lab Work: None ordered If you have labs (blood work) drawn today and your tests are completely normal, you will receive your results only by: MyChart Message (if you have MyChart) OR A paper copy in the mail If you have any lab test that is abnormal or we need to change your treatment, we will call you to review the results.   Testing/Procedures:  Stress Echocardiogram Instructions:    1. You may take all of your medications except Atenolol(hold beta blockers).  2. No food, drink or tobacco products four hours prior to your test.  3. Dress prepared to exercise. Best to wear 2 piece outfit and tennis shoes. Shoes must be closed toe.  4. Please bring all current prescription medications.  We will order CT coronary calcium score. It will cost $99.00 and is due at time of scan.  Please call to schedule.    [facility] [address] [place], [place] [address] [address] on the 1st floor [phone]   Follow-Up: At [facility], you and your health needs are our priority.  As part of our continuing mission  to provide you with exceptional heart care, we have created designated Provider Care Teams.  These Care Teams include your primary Cardiologist (physician) and Advanced Practice Providers (APPs -  Physician Assistants and Nurse Practitioners) who all work together to provide you with the care you  need, when you need it.  We recommend signing up for the patient portal called "MyChart".  Sign up information is provided on this After Visit Summary.  MyChart is used to connect with patients for Virtual Visits (Telemedicine).  Patients are able to view lab/test results, encounter notes, upcoming appointments, etc.  Non-urgent messages can be sent to your provider as well.   To learn more about what you can do with MyChart, go to [email or web address].    Your next appointment:   2 month(s)  The format for your next appointment:   In Person  Provider:   Belva Crome, MD   Other Instructions Exercise Stress Echocardiogram An exercise stress echocardiogram is a test to check how well your heart is working. This test uses sound waves and a computer to make pictures of your heart. These pictures will be taken before and after you exercise. For this test, you will walk on a treadmill or ride a bicycle to make your heart beat faster. While you exercise, your heart will be checked with an electrocardiogram (ECG). Your blood pressure will also be checked. You may have this test if: You have chest pain or a heart problem. You had a heart attack or heart surgery not long ago. You have heart valve problems. You have a condition that causes narrowing of the blood vessels that supply your heart. You have a high risk of heart disease and: You are starting a new exercise program. You need to have a big surgery. Tell a doctor about: Any allergies you have. All medicines you are taking. This includes vitamins, herbs, eye drops, creams, and over-the-counter medicines. Any problems you or family members have had with medicines that make you fall asleep (anesthetic medicines). Any surgeries you have had. Any blood disorders you have. Any medical conditions you have. Whether you are pregnant or may be pregnant. What are the risks? Generally, this is a safe test. However, problems may occur,  including: Chest pain. Feeling dizzy or light-headed. Shortness of breath. Increased or irregular heartbeat. Feeling like you may vomit (nausea) or vomiting. Heart attack. This is very rare. What happens before the test? Medicines Ask your doctor about changing or stopping your normal medicines. This is important if you take diabetes medicines or blood thinners. If you use an inhaler, bring it to the test. General instructions Wear comfortable clothes and walking shoes. Follow instructions from your doctor about what you cannot eat or drink before the test. Do not drink or eat anything that has caffeine in it. Stop having caffeine 24 hours before the test. Do not smoke or use products that contain nicotine or tobacco for 4 hours before the test. If you need help quitting, ask your doctor. What happens during the test?  You will take off your clothes from the waist up and put on a hospital gown. Electrodes or patches will be put on your chest. A blood pressure cuff will be put on your arm. Before you exercise, a computer will make a picture of your heart. To do this: You will lie down and a gel will be put on your chest. A wand will be moved over the gel. Sound waves from the wand will go to  the computer to make the picture. Then, you will start to exercise. You may walk on a treadmill or pedal a bicycle. Your blood pressure and heart rhythm will be checked while you exercise. The exercise will get harder or faster. You will exercise until: Your heart reaches a certain level. You are too tired to go on. You cannot go on because of chest pain, weakness, or dizziness. You will lie down right away so another picture of your heart can be taken. The procedure may vary among doctors and hospitals. What can I expect after the test? After your test, it is common to have: Mild soreness. Mild tiredness. Your heart rate and blood pressure will be checked until they return to your normal  levels. You should not have any new symptoms after this test. Follow these instructions at home: If your doctor says that you can, you may: Eat what you normally eat. Do your normal activities. Take over-the-counter and prescription medicines only as told by your doctor. Keep all follow-up visits. It is up to you to get the results of your test. Ask how to get your results when they are ready. Contact a doctor if: You feel dizzy or light-headed. You have a fast or irregular heartbeat. You feel like you may vomit or you vomit. You have a headache. You feel short of breath. Get help right away if: You develop pain or pressure: In your chest. In your jaw or neck. Between your shoulders. That goes down your left arm. You faint. You have trouble breathing. These symptoms may be an emergency. Get medical help right away. Call your local emergency services (911 in the [place] Do not wait to see if the symptoms will go away. Do not drive yourself to the hospital. Summary This is a test that checks how well your heart is working. Follow instructions about what you cannot eat or drink before the test. Ask your doctor if you should take your normal medicines before the test. Stop having caffeine 24 hours before the test. Do not smoke or use products with nicotine or tobacco in them for 4 hours before the test. During the test, your blood pressure and heart rhythm will be checked while you exercise. This information is not intended to replace advice given to you by your health care provider. Make sure you discuss any questions you have with your health care provider. Document Revised: 11/01/2020 Document Reviewed: 10/12/2019 Elsevier Patient Education  2022 Elsevier Inc.  Coronary Calcium Scan A coronary calcium scan is an imaging test used to look for deposits of plaque in the inner lining of the blood vessels of the heart (coronary arteries). Plaque is made up of calcium, protein, and fatty  substances. These deposits of plaque can partly clog and narrow the coronary arteries without producing any symptoms or warning signs. This puts a person at risk for a heart attack. A coronary calcium scan is performed using a computed tomography (CT) scanner machine without using a dye (contrast). This test is recommended for people who are at moderate risk for heart disease. The test can find plaque deposits before symptoms develop. Tell a health care provider about: Any allergies you have. All medicines you are taking, including vitamins, herbs, eye drops, creams, and over-the-counter medicines. Any problems you or family members have had with anesthetic medicines. Any bleeding problems you have. Any surgeries you have had. Any medical conditions you have. Whether you are pregnant or may be pregnant. What are the risks? Generally,  this is a safe procedure. However, problems may occur, including: Harm to a pregnant woman and her unborn baby. This test involves the use of radiation. Radiation exposure can be dangerous to a pregnant woman and her unborn baby. If you are pregnant or think you may be pregnant, you should not have this procedure done. A slight increase in the risk of cancer. This is because of the radiation involved in the test. The amount of radiation from one test is similar to the amount of radiation you are naturally exposed to over one year. What happens before the procedure? Ask your health care provider for any specific instructions on how to prepare for this procedure. You may be asked to avoid products that contain caffeine, tobacco, or nicotine for 4 hours before the procedure. What happens during the procedure?  You will undress and remove any jewelry from your neck or chest. You may need to remove hearing aides and dentures. Women may need to remove their bras. You will put on a hospital gown. Sticky electrodes will be placed on your chest. The electrodes will be  connected to an electrocardiogram (ECG) machine to record a tracing of the electrical activity of your heart. You will lie down on your back on a curved bed that is attached to the CT scanner. You may be given medicine to slow down your heart rate so that clear pictures can be created. You will be moved into the CT scanner, and the CT scanner will take pictures of your heart. During this time, you will be asked to lie still and hold your breath for 10-20 seconds at a time while each picture of your heart is being taken. The procedure may vary among health care providers and hospitals. What can I expect after the procedure? You can return to your normal activities. It is up to you to get the results of your procedure. Ask your health care provider, or the department that is doing the procedure, when your results will be ready. Summary A coronary calcium scan is an imaging test used to look for deposits of plaque in the inner lining of the blood vessels of the heart. Plaque is made up of calcium, protein, and fatty substances. A coronary calcium scan is performed using a CT scanner machine without contrast. Generally, this is a safe procedure. Tell your health care provider if you are pregnant or may be pregnant. Ask your health care provider for any specific instructions on how to prepare for this procedure. You can return to your normal activities after the scan is done. This information is not intended to replace advice given to you by your health care provider. Make sure you discuss any questions you have with your health care provider. Document Revised: 01/28/2021 Document Reviewed: 01/28/2021 Elsevier Patient Education  2024 [employer].

## 2023-01-10 ENCOUNTER — Other Ambulatory Visit: Payer: Self-pay | Admitting: Medical

## 2023-01-10 ENCOUNTER — Telehealth: Payer: Self-pay | Admitting: Cardiology

## 2023-01-10 ENCOUNTER — Telehealth: Payer: Self-pay

## 2023-01-10 ENCOUNTER — Ambulatory Visit (HOSPITAL_BASED_OUTPATIENT_CLINIC_OR_DEPARTMENT_OTHER)
Admission: RE | Admit: 2023-01-10 | Discharge: 2023-01-10 | Disposition: A | Discharge status: Home / Self Care | Payer: Medicare Other | Source: Ambulatory Visit | Attending: Cardiology | Admitting: Cardiology

## 2023-01-10 DIAGNOSIS — E785 Hyperlipidemia, unspecified: Secondary | ICD-10-CM | POA: Insufficient documentation

## 2023-01-10 NOTE — Telephone Encounter (Signed)
Pt c/o BP issue: STAT if pt c/o blurred vision, one-sided weakness or slurred speech  1. What are your last 5 BP readings?  11/07: 117/62 11/08: 169/92   2. Are you having any other symptoms (ex. Dizziness, headache, blurred vision, passed out)?  No   3. What is your BP issue?   Patient states since being taken off Nifedipine his BP has increased to the 180's/90's. Yesterday it was fine and today it is at 169/92 and he can't get it any lower. Would like to know if he can be put back on the medication. He states he has a CT scheduled this afternoon and he plans to go by the office to discuss this matter with the nurse as well.

## 2023-01-10 NOTE — Telephone Encounter (Signed)
Spoke with pt regarding message from Dr. Tomie China regarding blood pressure readings. Advised per Dr. Tomie China to continue to check his blood pressures through the weekend and let Dr. Tomie China know how his BP readings are the first of the week. Pt verbalized understanding and had no further questions.

## 2023-01-15 ENCOUNTER — Telehealth: Payer: Self-pay

## 2023-01-15 DIAGNOSIS — E785 Hyperlipidemia, unspecified: Secondary | ICD-10-CM

## 2023-01-15 MED ORDER — ROSUVASTATIN CALCIUM 20 MG PO TABS: 20.0000 mg | ORAL_TABLET | Freq: Every day | ORAL | 3 refills | Status: DC

## 2023-01-15 NOTE — Telephone Encounter (Signed)
MyChart message

## 2023-01-15 NOTE — Telephone Encounter (Signed)
-----   Message from Aundra Dubin Revankar sent at 01/15/2023  3:18 PM EST ----- Very markedly elevated calcium score.  Rosuvastatin 20 mg daily and liver lipid check in 6 weeks.  Copy primary care I will also with for results of stress echo. Garwin Brothers, MD 01/15/2023 3:17 PM

## 2023-01-16 ENCOUNTER — Telehealth: Payer: Self-pay | Admitting: Cardiology

## 2023-01-16 ENCOUNTER — Other Ambulatory Visit: Payer: Self-pay | Admitting: Cardiology

## 2023-01-16 MED ORDER — AMLODIPINE BESYLATE 5 MG PO TABS: 5.0000 mg | ORAL_TABLET | Freq: Every day | ORAL | 1 refills | Status: DC

## 2023-01-16 NOTE — Telephone Encounter (Signed)
Patient stated he has completed his BP chart and wants to drop off it off for Dr. Kem Parkinson review so he can get new medication.

## 2023-01-16 NOTE — Telephone Encounter (Signed)
BP log reviewed by Dr. Bing Matter and advise verbally to add Amlodipine 5 mg daily. Patient notified.

## 2023-01-16 NOTE — Telephone Encounter (Signed)
Spoke with patient and he was in office dropping off BP chart

## 2023-01-20 ENCOUNTER — Other Ambulatory Visit: Payer: Self-pay | Admitting: Medical

## 2023-01-20 ENCOUNTER — Encounter: Payer: Self-pay | Admitting: Hematology & Oncology

## 2023-01-20 ENCOUNTER — Encounter: Payer: Self-pay | Admitting: Medical

## 2023-01-20 ENCOUNTER — Ambulatory Visit (INDEPENDENT_AMBULATORY_CARE_PROVIDER_SITE_OTHER): Payer: Medicare Other | Admitting: Physician Assistant

## 2023-01-20 VITALS — BP 181/83 | HR 79 | Temp 98.6°F | Ht 70.5 in | Wt 241.1 lb

## 2023-01-20 DIAGNOSIS — I10 Essential (primary) hypertension: Secondary | ICD-10-CM | POA: Diagnosis not present

## 2023-01-20 DIAGNOSIS — T148XXA Other injury of unspecified body region, initial encounter: Secondary | ICD-10-CM

## 2023-01-20 DIAGNOSIS — I160 Hypertensive urgency: Secondary | ICD-10-CM

## 2023-01-20 MED ORDER — HYDRALAZINE HCL 10 MG PO TABS: 10.0000 mg | ORAL_TABLET | Freq: Two times a day (BID) | ORAL | 0 refills | Status: DC

## 2023-01-20 MED ORDER — CYCLOBENZAPRINE HCL 5 MG PO TABS: 5.0000 mg | ORAL_TABLET | Freq: Three times a day (TID) | ORAL | 0 refills | Status: AC | PRN

## 2023-01-20 NOTE — Progress Notes (Unsigned)
Established patient visit   Patient: Randall Carter   DOB: May 16, 1945   77 y.o. Male  MRN: [id number] Visit Date: 01/20/2023  Today's healthcare provider: Alfredia Ferguson, PA-C   Chief Complaint  Patient presents with   Hypertension    Complains of chest pain when moving or reaching for something and headaches   Subjective     Patient presents today with significant elevation in blood pressure following some medication changes from his cardiologist.  He reports he was told to discontinue his atenolol and nifedipine and was started on losartan 50 mg.  Shortly after he began experiencing significantly high pressures, 160s over 90s, 180s over 90s and further increased today.  He has been experiencing some low-grade headaches he rates a 5 out of 10, reports some potential blurry vision today.  He reports he lifted a heavy box about a month ago and since then has had some tightness in pain across his chest with movement.  He reports he does not feel this pain at rest, does not feel it in the appointment today.  He only feels this pain when he moves or pushes up with his arms.  Medications: Outpatient Medications Prior to Visit  Medication Sig   abiraterone acetate (ZYTIGA) 250 MG tablet TAKE 2 TABLETS BY MOUTH DAILY ON AN EMPTY STOMACH 1 HOUR BEFORE  OR 2 HOURS AFTER A MEAL   albuterol (VENTOLIN HFA) 108 (90 Base) MCG/ACT inhaler Inhale 2 puffs into the lungs every 6 (six) hours as needed.   amLODipine (NORVASC) 5 MG tablet Take 1 tablet (5 mg total) by mouth daily.   aspirin 81 MG EC tablet Take 81 mg by mouth daily.   budesonide-formoterol (SYMBICORT) 160-4.5 MCG/ACT inhaler Inhale 2 puffs into the lungs 2 (two) times daily.   fexofenadine (ALLEGRA) 60 MG tablet Take 60 mg by mouth daily.   ipratropium (ATROVENT) 0.06 % nasal spray Place 1 spray into both nostrils 4 (four) times daily.   losartan (COZAAR) 50 MG tablet Take 1 tablet (50 mg total) by mouth daily.   Multiple Vitamin  (MULTI-VITAMIN) tablet Take 1 tablet by mouth daily.   naproxen (NAPROSYN) 500 MG tablet Take 500 mg by mouth daily as needed for mild pain (pain score 1-3) or moderate pain (pain score 4-6).   predniSONE (DELTASONE) 5 MG tablet TAKE 1 TABLET(5 MG) BY MOUTH DAILY WITH BREAKFAST   rosuvastatin (CRESTOR) 20 MG tablet Take 1 tablet (20 mg total) by mouth daily.   vitamin B-12 (CYANOCOBALAMIN) 500 MCG tablet Take 500 mcg by mouth daily.   [DISCONTINUED] atenolol (TENORMIN) 50 MG tablet TAKE 1 TABLET(50 MG) BY MOUTH DAILY   No facility-administered medications prior to visit.    Review of Systems  Eyes:  Positive for visual disturbance.  Respiratory:  Positive for chest tightness.   Neurological:  Positive for headaches.       Objective    BP (!) 181/83   Pulse 79   Temp 98.6 F (37 C) (Oral)   Ht 5' 10.5" (1.791 m)   Wt 241 lb 2 oz (109.4 kg)   SpO2 98%   BMI 34.11 kg/m    Physical Exam Constitutional:      General: He is awake.     Appearance: He is well-developed.  HENT:     Head: Normocephalic.  Eyes:     Conjunctiva/sclera: Conjunctivae normal.  Cardiovascular:     Rate and Rhythm: Normal rate and regular rhythm.  Heart sounds: Normal heart sounds.  Pulmonary:     Effort: Pulmonary effort is normal.     Breath sounds: Normal breath sounds.  Skin:    General: Skin is warm.  Neurological:     Mental Status: He is alert and oriented to person, place, and time.  Psychiatric:        Attention and Perception: Attention normal.        Mood and Affect: Mood normal.        Speech: Speech normal.        Behavior: Behavior is cooperative.      No results found for any visits on 01/20/23.  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  Reviewed phone calls back-and-forth from cardiology.  It was recommended he double his losartan from 50 to 100 mg -- he has not done so yet, recommended today. Continue amlodipine 5 mg. The  patient has a follow-up with cardiology tomorrow, but he and his partner are very anxious 2/2 to the elevated blood pressure. In the meantime I will provide hydralazine 10 mg, advised to take one today and one tomorrow morning. Advised caution for hypotension, patient agreeable to plan.  Cautioned for signs of hemorrhagic stroke and when to go to the emergency room.  -     hydrALAZINE HCl; Take 1 tablet (10 mg total) by mouth 2 (two) times daily.  Dispense: 30 tablet; Refill: 0  Muscle strain For muscle strain of chest, provided cyclobenzaprine 5 mg.  Recommended topical heat.  -     Cyclobenzaprine HCl; Take 1 tablet (5 mg total) by mouth 3 (three) times daily as needed for muscle spasms.  Dispense: 15 tablet; Refill: 0   Return if symptoms worsen or fail to improve.       Alfredia Ferguson, PA-C  [facility] [phone] (phone) [phone] (fax)  [facility]

## 2023-01-21 ENCOUNTER — Encounter: Payer: Self-pay | Admitting: Physician Assistant

## 2023-01-21 ENCOUNTER — Encounter: Payer: Self-pay | Admitting: Cardiology

## 2023-01-21 ENCOUNTER — Ambulatory Visit: Payer: Medicare Other | Attending: Cardiology | Admitting: Cardiology

## 2023-01-21 VITALS — BP 154/80 | HR 90 | Ht 70.0 in | Wt 240.1 lb

## 2023-01-21 DIAGNOSIS — I10 Essential (primary) hypertension: Secondary | ICD-10-CM | POA: Diagnosis not present

## 2023-01-21 DIAGNOSIS — E088 Diabetes mellitus due to underlying condition with unspecified complications: Secondary | ICD-10-CM

## 2023-01-21 DIAGNOSIS — E7849 Other hyperlipidemia: Secondary | ICD-10-CM

## 2023-01-21 DIAGNOSIS — E66811 Obesity, class 1: Secondary | ICD-10-CM | POA: Diagnosis not present

## 2023-01-21 DIAGNOSIS — I2510 Atherosclerotic heart disease of native coronary artery without angina pectoris: Secondary | ICD-10-CM

## 2023-01-21 HISTORY — DX: Atherosclerotic heart disease of native coronary artery without angina pectoris: I25.10

## 2023-01-21 MED ORDER — NITROGLYCERIN 0.4 MG SL SUBL: 0.4000 mg | SUBLINGUAL_TABLET | SUBLINGUAL | 3 refills | Status: DC | PRN

## 2023-01-21 MED ORDER — AMLODIPINE BESYLATE 10 MG PO TABS: 10.0000 mg | ORAL_TABLET | Freq: Every day | ORAL | 3 refills | Status: DC

## 2023-01-21 NOTE — Patient Instructions (Addendum)
Medication Instructions:  Your physician has recommended you make the following change in your medication:   Your amlodipine will be increased from 5 mg to 10 mg every day. You may take two 5 mg tablets to equal 10 mg with the medication you have left. Your next Rx will be for a 10 mg prescription.   Use nitroglycerin 1 tablet placed under the tongue at the first sign of chest pain or an angina attack. 1 tablet may be used every 5 minutes as needed, for up to 15 minutes. Do not take more than 3 tablets in 15 minutes. If pain persist call 911 or go to the nearest ED.  *If you need a refill on your cardiac medications before your next appointment, please call your pharmacy*   Lab Work:  You will need fasting blood work on 12/26. You will need a lipid panel.   If you have labs (blood work) drawn today and your tests are completely normal, you will receive your results only by: MyChart Message (if you have MyChart) OR A paper copy in the mail If you have any lab test that is abnormal or we need to change your treatment, we will call you to review the results.   Testing/Procedures: None  Follow-Up: At [facility], you and your health needs are our priority.  As part of our continuing mission to provide you with exceptional heart care, we have created designated Provider Care Teams.  These Care Teams include your primary Cardiologist (physician) and Advanced Practice Providers (APPs -  Physician Assistants and Nurse Practitioners) who all work together to provide you with the care you need, when you need it.  We recommend signing up for the patient portal called "MyChart".  Sign up information is provided on this After Visit Summary.  MyChart is used to connect with patients for Virtual Visits (Telemedicine).  Patients are able to view lab/test results, encounter notes, upcoming appointments, etc.  Non-urgent messages can be sent to your provider as well.   To learn more about what you  can do with MyChart, go to [email or web address].    Your next appointment:   6 month(s)  Provider:   Belva Crome, MD

## 2023-01-21 NOTE — Progress Notes (Signed)
Cardiology Office Note:    Date:  01/21/2023   ID:  Randall Carter, DOB 25-May-1945, MRN [id number]  PCP:  Esperanza Richters, PA-C  Cardiologist:  Garwin Brothers, MD   Referring MD: Esperanza Richters, PA-C    ASSESSMENT:    1. Essential hypertension   2. Diabetes mellitus due to underlying condition with unspecified complications (HCC)   3. Obesity (BMI 30.0-34.9)   4. Coronary artery disease involving native coronary artery of native heart without angina pectoris   5. Familial hyperlipidemia    PLAN:    In order of problems listed above:  Elevated calcium score: I discussed this with the patient at length.  This is markedly elevated.  He is going to get a exercise stress Cardiolite in the next few days.  Sublingual nitroglycerin prescription was sent, its protocol and 911 protocol explained and the patient vocalized understanding questions were answered to the patient's satisfaction. Essential hypertension: Blood pressure is much better at this time.  I have doubled his amlodipine to 10 mg daily.  Lifestyle modification urged. Mixed dyslipidemia: On statin therapy.  Diet emphasized.  He promises to do better.  He will come for follow-up blood work.  Goal LDL less than 60. I we will give him an appointment with our dietitian for optimizing his diet as related to his diabetes and lipids. Patient will be seen in follow-up appointment in 6 months or earlier if the patient has any concerns.    Medication Adjustments/Labs and Tests Ordered: Current medicines are reviewed at length with the patient today.  Concerns regarding medicines are outlined above.  No orders of the defined types were placed in this encounter.  No orders of the defined types were placed in this encounter.    No chief complaint on file.    History of Present Illness:    Randall Carter is a 77 y.o. male.  Patient has past medical history of essential hypertension, diabetes mellitus, familial dyslipidemia and  recently diagnosed coronary artery disease with a markedly elevated calcium score.  He denies any problems at this time and takes care of activities of daily living.  No chest pain orthopnea or PND.  At the time of my evaluation, the patient is alert awake oriented and in no distress.  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   History of radiation therapy 05/03/2022   Hyperlipidemia associated with type 2 diabetes mellitus (HCC) 04/05/2015   Hypertension associated with type 2 diabetes mellitus (HCC) 04/05/2015   Mild intermittent asthma without complication 04/05/2015   Obesity (BMI 30.0-34.9) 01/09/2023   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   Prostate cancer (HCC) 01/09/2023   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   Prostate cancer metastatic to multiple sites [facility]) 07/27/2020   Receiving treatment at [facility] in [place]     Thrombocytopenia [facility]) 03/26/2020   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   amLODipine (NORVASC) 5 MG tablet  Take 1 tablet (5 mg total) by mouth daily.   aspirin 81 MG EC tablet Take 81 mg by mouth daily.   budesonide-formoterol (SYMBICORT) 160-4.5 MCG/ACT inhaler Inhale 2 puffs into the lungs 2 (two) times daily.   cyclobenzaprine (FLEXERIL) 5 MG tablet Take 1 tablet (5 mg total) by mouth 3 (three) times daily as needed for muscle spasms.   fexofenadine (ALLEGRA) 60 MG tablet Take 60 mg by mouth daily.   hydrALAZINE (APRESOLINE) 10 MG tablet Take 1 tablet (10 mg total) by mouth 2 (two) times daily.   ipratropium (ATROVENT) 0.06 % nasal spray Place 1 spray into both nostrils 4 (four) times daily.   losartan (COZAAR) 50 MG tablet Take 1 tablet (50 mg total) by mouth daily.   Multiple Vitamin (MULTI-VITAMIN) tablet Take 1 tablet by mouth daily.   naproxen (NAPROSYN) 500 MG tablet Take 500 mg by mouth daily as needed for mild pain (pain score 1-3) or moderate pain (pain score 4-6).   predniSONE (DELTASONE) 5 MG tablet TAKE 1 TABLET(5 MG) BY MOUTH DAILY WITH BREAKFAST   rosuvastatin (CRESTOR) 20 MG tablet Take 1 tablet (20 mg total) by mouth daily.   vitamin B-12 (CYANOCOBALAMIN) 500 MCG tablet Take 500 mcg by mouth daily.     Allergies:   Metformin and related and Tamsulosin   Social History   Socioeconomic History   Marital status: Married    Spouse name: Not on file   Number of children: Not on file   Years of education: Not on file   Highest education level: Not on file  Occupational History   Not on file  Tobacco Use   Smoking status: Never    Passive exposure: Never   Smokeless tobacco: Never  Vaping Use   Vaping status: Never Used  Substance and Sexual Activity   Alcohol use: Not Currently   Drug use: Not Currently   Sexual activity: Not Currently  Other Topics Concern   Not on file  Social History Narrative   Not on file   Social Determinants of Health   Financial Resource Strain: Low Risk  (10/01/2022)   Overall Financial Resource Strain (CARDIA)    Difficulty of Paying  Living Expenses: Not hard at all  Food Insecurity: No Food Insecurity (10/01/2022)   Hunger Vital Sign    Worried About Running Out of Food in the Last Year: Never true    Ran Out of Food in the Last Year: Never true  Transportation Needs: No Transportation Needs (10/01/2022)   PRAPARE - Administrator, Civil Service (Medical): No    Lack of Transportation (Non-Medical): No  Physical Activity: Insufficiently Active (10/01/2022)   Exercise Vital Sign    Days of Exercise per Week: 3 days    Minutes of Exercise per Session: 30 min  Stress: No Stress Concern Present (10/01/2022)   [employer] of Occupational Health - Occupational Stress Questionnaire    Feeling of Stress : Not at all  Social Connections: Moderately Integrated (10/01/2022)   Social Connection and Isolation Panel [NHANES]    Frequency of Communication with Friends and Family: Twice  a week    Frequency of Social Gatherings with Friends and Family: Once a week    Attends Religious Services: More than 4 times per year    Active Member of [employer] or Organizations: No    Attends Banker Meetings: Never    Marital Status: Married     Family History: The patient's family history includes Diabetes in his mother; Hypertension in his father. There is no history of Heart disease or Cancer.  ROS:   Please see the history of present illness.    All other systems reviewed and are negative.  EKGs/Labs/Other Studies Reviewed:    The following studies were reviewed today: I discussed calcium scoring report with the patient at length   Recent Labs: 12/20/2022: ALT 14; BUN 20; Creatinine 1.17; Hemoglobin 13.2; Platelet Count 167; Potassium 4.3; Sodium 139  Recent Lipid Panel    Component Value Date/Time   CHOL 282 (H) 12/18/2022 0910   TRIG 127.0 12/18/2022 0910   HDL 42.80 12/18/2022 0910   CHOLHDL 7 12/18/2022 0910   VLDL 25.4 12/18/2022 0910   LDLCALC 214 (H) 12/18/2022 0910    Physical Exam:     VS:  BP (!) 160/82   Pulse 90   Ht 5\' 10"  (1.778 m)   Wt 240 lb 1.9 oz (108.9 kg)   SpO2 96%   BMI 34.45 kg/m     Wt Readings from Last 3 Encounters:  01/21/23 240 lb 1.9 oz (108.9 kg)  01/20/23 241 lb 2 oz (109.4 kg)  01/09/23 239 lb 1.9 oz (108.5 kg)     GEN: Patient is in no acute distress HEENT: Normal NECK: No JVD; No carotid bruits LYMPHATICS: No lymphadenopathy CARDIAC: Hear sounds regular, 2/6 systolic murmur at the apex. RESPIRATORY:  Clear to auscultation without rales, wheezing or rhonchi  ABDOMEN: Soft, non-tender, non-distended MUSCULOSKELETAL:  No edema; No deformity  SKIN: Warm and dry NEUROLOGIC:  Alert and oriented x 3 PSYCHIATRIC:  Normal affect   Signed, Garwin Brothers, MD  01/21/2023 2:50 PM    [facility]

## 2023-02-03 ENCOUNTER — Telehealth: Payer: Self-pay

## 2023-02-03 DIAGNOSIS — I10 Essential (primary) hypertension: Secondary | ICD-10-CM

## 2023-02-03 MED ORDER — LOSARTAN POTASSIUM 100 MG PO TABS: 100.0000 mg | ORAL_TABLET | Freq: Every day | ORAL | 3 refills | Status: DC

## 2023-02-03 NOTE — Telephone Encounter (Signed)
Spoke with pt and advised to increase Losartan to 100 mg daily. Keep a BP log and return for labs in 2 weeks. Pt verbalized understanding and had no additional questions.

## 2023-02-04 ENCOUNTER — Encounter (HOSPITAL_COMMUNITY): Payer: Self-pay

## 2023-02-05 ENCOUNTER — Telehealth (HOSPITAL_COMMUNITY): Payer: Self-pay | Admitting: *Deleted

## 2023-02-05 ENCOUNTER — Other Ambulatory Visit (HOSPITAL_COMMUNITY): Payer: Medicare Other

## 2023-02-05 NOTE — Telephone Encounter (Signed)
Patient given detailed instructions per Stress Test Requisition Sheet for test on 02/12/2023 at 2:30.Patient Notified to arrive 30 minutes early, and that it is imperative to arrive on time for appointment to keep from having the test rescheduled.  Patient verbalized understanding. Daneil Dolin

## 2023-02-10 ENCOUNTER — Other Ambulatory Visit: Payer: Self-pay | Admitting: Hematology & Oncology

## 2023-02-10 DIAGNOSIS — C61 Malignant neoplasm of prostate: Secondary | ICD-10-CM

## 2023-02-12 ENCOUNTER — Ambulatory Visit (HOSPITAL_COMMUNITY): Payer: Medicare Other | Attending: Cardiology

## 2023-02-12 ENCOUNTER — Ambulatory Visit (HOSPITAL_COMMUNITY): Payer: Medicare Other

## 2023-02-12 DIAGNOSIS — R079 Chest pain, unspecified: Secondary | ICD-10-CM | POA: Diagnosis not present

## 2023-02-12 MED ORDER — PERFLUTREN LIPID MICROSPHERE
1.0000 mL | INTRAVENOUS | Status: AC | PRN
  Administered 2023-02-12 (×3): 2 mL via INTRAVENOUS

## 2023-02-26 IMAGING — NM NM BONE WHOLE BODY
2 series · 2 of 2 positions shown · non-contrast
Comparison: CT scan dated 07/27/2020

CLINICAL DATA: Elevated PSA

EXAM:
NUCLEAR MEDICINE WHOLE BODY BONE SCAN
TECHNIQUE: Whole body anterior and posterior images were obtained approximately
3 hours after intravenous injection of radiopharmaceutical.
RADIOPHARMACEUTICALS:  Twenty-two mCi Mechnetium-FFm MDP IV

[Series 1: whole body · 2.66mm/px · 1 of 1 slices shown (1 of 2)]
[im 1/1]
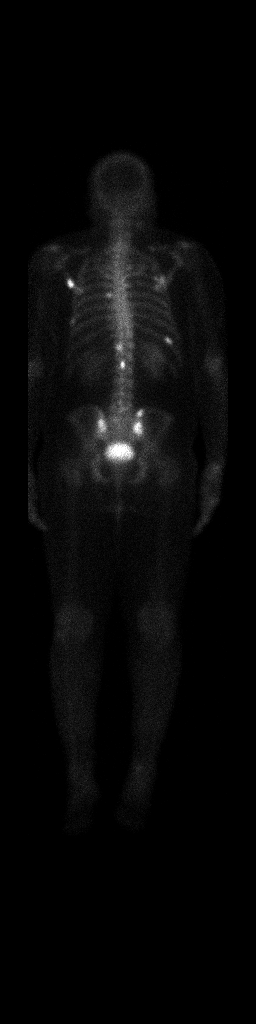

[Series 1: whole body · 2.66mm/px · 1 of 1 slices shown (2 of 2)]
[im 1/1]
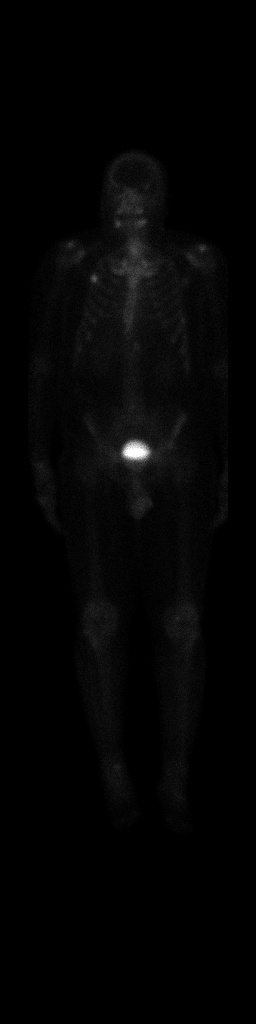

[2 of 2 positions shown; findings below may reference images not displayed]

FINDINGS: There is focal increased radiotracer uptake within the RIGHT iliac
crest, midline of the approximate L1 vertebral body, LEFT lateral
aspect of the approximate T11 vertebral body, posterior RIGHT ninth
rib, LEFT posterior sixth rib, LEFT lateral seventh rib RIGHT
posterior fifth and fourth ribs, anterolateral RIGHT third rib and
the LEFT scapula.

There is radiotracer uptake within the sternoclavicular joints and
bilateral shoulders This is favored to be degenerative in etiology.

Expected physiologic distribution of radiotracer.
IMPRESSION: Multifocal increased radiotracer uptake as above is concerning for
osseous metastatic disease.

## 2023-03-13 ENCOUNTER — Encounter: Payer: Self-pay | Admitting: Cardiology

## 2023-03-13 ENCOUNTER — Ambulatory Visit: Payer: Medicare Other | Attending: Cardiology | Admitting: Cardiology

## 2023-03-13 VITALS — BP 166/82 | HR 80 | Ht 70.6 in | Wt 239.1 lb

## 2023-03-13 DIAGNOSIS — E088 Diabetes mellitus due to underlying condition with unspecified complications: Secondary | ICD-10-CM

## 2023-03-13 DIAGNOSIS — I152 Hypertension secondary to endocrine disorders: Secondary | ICD-10-CM | POA: Diagnosis not present

## 2023-03-13 DIAGNOSIS — I10 Essential (primary) hypertension: Secondary | ICD-10-CM | POA: Diagnosis not present

## 2023-03-13 DIAGNOSIS — E1159 Type 2 diabetes mellitus with other circulatory complications: Secondary | ICD-10-CM | POA: Diagnosis not present

## 2023-03-13 DIAGNOSIS — E7849 Other hyperlipidemia: Secondary | ICD-10-CM | POA: Diagnosis not present

## 2023-03-13 DIAGNOSIS — I2510 Atherosclerotic heart disease of native coronary artery without angina pectoris: Secondary | ICD-10-CM

## 2023-03-13 NOTE — Patient Instructions (Signed)
Medication Instructions:  Your physician recommends that you continue on your current medications as directed. Please refer to the Current Medication list given to you today.  *If you need a refill on your cardiac medications before your next appointment, please call your pharmacy*   Lab Work: Your physician recommends that you return for lab work in: the next few days for CMP and lipids. You need to have labs done when you are fasting. [facility] lab is located on the 3rd floor, [address]. Hours are Monday - Friday 8 am to 4 pm, closed 11:30 am to 1:00 pm. You do NOT need an appointment.   If you have labs (blood work) drawn today and your tests are completely normal, you will receive your results only by: MyChart Message (if you have MyChart) OR A paper copy in the mail If you have any lab test that is abnormal or we need to change your treatment, we will call you to review the results.   Testing/Procedures: None ordered   Follow-Up: At [facility], you and your health needs are our priority.  As part of our continuing mission to provide you with exceptional heart care, we have created designated Provider Care Teams.  These Care Teams include your primary Cardiologist (physician) and Advanced Practice Providers (APPs -  Physician Assistants and Nurse Practitioners) who all work together to provide you with the care you need, when you need it.  We recommend signing up for the patient portal called "MyChart".  Sign up information is provided on this After Visit Summary.  MyChart is used to connect with patients for Virtual Visits (Telemedicine).  Patients are able to view lab/test results, encounter notes, upcoming appointments, etc.  Non-urgent messages can be sent to your provider as well.   To learn more about what you can do with MyChart, go to [email or web address].    Your next appointment:   9 month(s)  The format for your next appointment:   In Person  Provider:    Rajan Revankar, MD    Other Instructions none  Important Information About Sugar       

## 2023-03-13 NOTE — Addendum Note (Signed)
 Addended by: Eleonore Chiquito on: 03/13/2023 03:33 PM   Modules accepted: Orders

## 2023-03-13 NOTE — Progress Notes (Signed)
 Cardiology Office Note:    Date:  03/13/2023   ID:  Randall Carter, DOB Apr 19, 1945, MRN [id number]  PCP:  Dorina Loving, PA-C  Cardiologist:  Jennifer JONELLE Crape, MD   Referring MD: Dorina Loving, PA-C    ASSESSMENT:    1. Hypertension associated with type 2 diabetes mellitus (HCC)   2. Essential hypertension   3. Coronary artery disease involving native coronary artery of native heart without angina pectoris   4. Diabetes mellitus due to underlying condition with unspecified complications (HCC)   5. Familial hyperlipidemia    PLAN:    In order of problems listed above:  Coronary artery disease: Secondary prevention stressed to the patient.  Importance of compliance with diet medication stressed and she vocalized understanding.  He was advised to do continue his excellent walking practices.  He is happy about it. Essential hypertension: Patient has an element of whitecoat hypertension.  He brought blood pressure readings from home and they are excellent.  I congratulated him about this. Mixed dyslipidemia: On lipid-lowering medications followed by primary care.  He will be back in the next few days for follow-up lipid check and we will advise him accordingly. Patient will be seen in follow-up appointment in 6 months or earlier if the patient has any concerns.    Medication Adjustments/Labs and Tests Ordered: Current medicines are reviewed at length with the patient today.  Concerns regarding medicines are outlined above.  No orders of the defined types were placed in this encounter.  No orders of the defined types were placed in this encounter.    No chief complaint on file.    History of Present Illness:    Randall Carter is a 78 y.o. male.  Patient has past medical history of coronary artery disease, essential hypertension, mixed dyslipidemia and diabetes mellitus.  He denies any problems at this time and takes care of activities of daily living.  No chest pain orthopnea  or PND.  He walks at least 30 minutes a day without any problems.  He is happy about it.  At the time of my evaluation, the patient is alert awake oriented and in no distress.  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   History of radiation therapy 05/03/2022   Hyperlipidemia associated with type 2 diabetes mellitus (HCC) 04/05/2015   Hypertension associated with type 2 diabetes mellitus (HCC) 04/05/2015   Mild intermittent asthma without complication 04/05/2015   Obesity (BMI 30.0-34.9) 01/09/2023   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   Prostate cancer (HCC) 01/09/2023   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   Prostate cancer metastatic to multiple sites [facility]) 07/27/2020   Receiving treatment at [facility] in [place]     Thrombocytopenia [facility]) 03/26/2020   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   amLODipine  (NORVASC ) 10 MG tablet Take 1 tablet (10 mg total) by mouth daily.   aspirin 81 MG EC  tablet Take 81 mg by mouth daily.   budesonide -formoterol  (SYMBICORT ) 160-4.5 MCG/ACT inhaler Inhale 2 puffs into the lungs 2 (two) times daily.   cyclobenzaprine  (FLEXERIL ) 5 MG tablet Take 1 tablet (5 mg total) by mouth 3 (three) times daily as needed for muscle spasms.   fexofenadine (ALLEGRA) 60 MG tablet Take 60 mg by mouth daily.   hydrALAZINE  (APRESOLINE ) 10 MG tablet Take 1 tablet (10 mg total) by mouth 2 (two) times daily.   ipratropium (ATROVENT) 0.06 % nasal spray Place 1 spray into both nostrils 4 (four) times daily.   losartan  (COZAAR ) 100 MG tablet Take 1 tablet (100 mg total) by mouth daily.   Multiple Vitamin (MULTI-VITAMIN) tablet Take 1 tablet by mouth daily.   naproxen (NAPROSYN) 500 MG tablet Take 500 mg by mouth daily as needed for mild pain (pain score 1-3) or moderate pain (pain score 4-6).   nitroGLYCERIN  (NITROSTAT ) 0.4 MG SL tablet Place 1 tablet (0.4 mg total) under the tongue every 5 (five) minutes as needed for chest pain.   predniSONE  (DELTASONE ) 5 MG tablet TAKE 1 TABLET(5 MG) BY MOUTH DAILY WITH BREAKFAST   rosuvastatin  (CRESTOR ) 20 MG tablet Take 1 tablet (20 mg total) by mouth daily.   vitamin B-12 (CYANOCOBALAMIN) 500 MCG tablet Take 500 mcg by mouth daily.     Allergies:   Metformin  and related and Tamsulosin   Social History   Socioeconomic History   Marital status: Married    Spouse name: Not on file   Number of children: Not on file   Years of education: Not on file   Highest education level: Not on file  Occupational History   Not on file  Tobacco Use   Smoking status: Never    Passive exposure: Never   Smokeless tobacco: Never  Vaping Use   Vaping status: Never Used  Substance and Sexual Activity   Alcohol use: Not Currently   Drug use: Not Currently   Sexual activity: Not Currently  Other Topics Concern   Not on file  Social History Narrative   Not on file   Social Drivers of Health   Financial Resource Strain: Low Risk   (10/01/2022)   Overall Financial Resource Strain (CARDIA)    Difficulty of Paying Living Expenses: Not hard at all  Food Insecurity: No Food Insecurity (10/01/2022)   Hunger Vital Sign    Worried About Running Out of Food in the Last Year: Never true    Ran Out of Food in the Last Year: Never true  Transportation Needs: No Transportation Needs (10/01/2022)   PRAPARE - Administrator, Civil Service (Medical): No    Lack of Transportation (Non-Medical): No  Physical Activity: Insufficiently Active (10/01/2022)   Exercise Vital Sign    Days of Exercise per Week: 3 days    Minutes of Exercise per Session: 30 min  Stress: No Stress Concern Present (10/01/2022)   [employer] of Occupational Health - Occupational Stress Questionnaire    Feeling of Stress : Not at all  Social Connections: Moderately Integrated (10/01/2022)   Social Connection and  Isolation Panel [NHANES]    Frequency of Communication with Friends and Family: Twice a week    Frequency of Social Gatherings with Friends and Family: Once a week    Attends Religious Services: More than 4 times per year    Active Member of [employer] or Organizations: No    Attends Banker Meetings: Never    Marital Status: Married     Family History: The patient's family history includes Diabetes in his mother; Hypertension in his father. There is no history of Heart disease or Cancer.  ROS:   Please see the history of present illness.    All other systems reviewed and are negative.  EKGs/Labs/Other Studies Reviewed:    The following studies were reviewed today: I discussed my findings with the patient at length   Recent Labs: 12/20/2022: ALT 14; BUN 20; Creatinine 1.17; Hemoglobin 13.2; Platelet Count 167; Potassium 4.3; Sodium 139  Recent Lipid Panel    Component Value Date/Time   CHOL 282 (H) 12/18/2022 0910   TRIG 127.0 12/18/2022 0910   HDL 42.80 12/18/2022 0910   CHOLHDL 7 12/18/2022 0910   VLDL 25.4  12/18/2022 0910   LDLCALC 214 (H) 12/18/2022 0910    Physical Exam:    VS:  BP (!) 166/82   Pulse 80   Ht 5' 10.6 (1.793 m)   Wt 239 lb 1.3 oz (108.4 kg)   SpO2 95%   BMI 33.72 kg/m     Wt Readings from Last 3 Encounters:  03/13/23 239 lb 1.3 oz (108.4 kg)  01/21/23 240 lb 1.9 oz (108.9 kg)  01/20/23 241 lb 2 oz (109.4 kg)     GEN: Patient is in no acute distress HEENT: Normal NECK: No JVD; No carotid bruits LYMPHATICS: No lymphadenopathy CARDIAC: Hear sounds regular, 2/6 systolic murmur at the apex. RESPIRATORY:  Clear to auscultation without rales, wheezing or rhonchi  ABDOMEN: Soft, non-tender, non-distended MUSCULOSKELETAL:  No edema; No deformity  SKIN: Warm and dry NEUROLOGIC:  Alert and oriented x 3 PSYCHIATRIC:  Normal affect   Signed, Jennifer JONELLE Crape, MD  03/13/2023 3:25 PM    [facility]

## 2023-03-21 ENCOUNTER — Other Ambulatory Visit: Payer: Self-pay

## 2023-03-21 ENCOUNTER — Inpatient Hospital Stay: Payer: Medicare Other | Admitting: Hematology & Oncology

## 2023-03-21 ENCOUNTER — Inpatient Hospital Stay: Payer: Medicare Other | Attending: Oncology

## 2023-03-21 ENCOUNTER — Inpatient Hospital Stay: Payer: Medicare Other

## 2023-03-21 ENCOUNTER — Encounter: Payer: Self-pay | Admitting: Hematology & Oncology

## 2023-03-21 VITALS — BP 143/65 | HR 76 | Temp 98.6°F | Resp 18 | Ht 70.6 in | Wt 238.0 lb

## 2023-03-21 DIAGNOSIS — C7951 Secondary malignant neoplasm of bone: Secondary | ICD-10-CM | POA: Diagnosis not present

## 2023-03-21 DIAGNOSIS — C61 Malignant neoplasm of prostate: Secondary | ICD-10-CM | POA: Insufficient documentation

## 2023-03-21 DIAGNOSIS — C7800 Secondary malignant neoplasm of unspecified lung: Secondary | ICD-10-CM | POA: Diagnosis not present

## 2023-03-21 DIAGNOSIS — Z5111 Encounter for antineoplastic chemotherapy: Secondary | ICD-10-CM | POA: Diagnosis present

## 2023-03-21 DIAGNOSIS — Z79899 Other long term (current) drug therapy: Secondary | ICD-10-CM | POA: Diagnosis not present

## 2023-03-21 LAB — CMP (CANCER CENTER ONLY)
ALT: 19 U/L (ref 0–44)
AST: 18 U/L (ref 15–41)
Albumin: 4.6 g/dL (ref 3.5–5.0)
Alkaline Phosphatase: 55 U/L (ref 38–126)
Anion gap: 11 (ref 5–15)
BUN: 22 mg/dL (ref 8–23)
CO2: 24 mmol/L (ref 22–32)
Calcium: 9.8 mg/dL (ref 8.9–10.3)
Chloride: 105 mmol/L (ref 98–111)
Creatinine: 1.2 mg/dL (ref 0.61–1.24)
GFR, Estimated: >60 mL/min (ref >60)
Glucose, Bld: 146 mg/dL — ABNORMAL HIGH (ref 70–99)
Potassium: 4 mmol/L (ref 3.5–5.1)
Sodium: 140 mmol/L (ref 135–145)
Total Bilirubin: 0.6 mg/dL (ref 0.0–1.2)
Total Protein: 7.5 g/dL (ref 6.5–8.1)

## 2023-03-21 LAB — CBC WITH DIFFERENTIAL (CANCER CENTER ONLY)
Abs Immature Granulocytes: 0.03 10*3/uL (ref 0.00–0.07)
Basophils Absolute: 0 10*3/uL (ref 0.0–0.1)
Basophils Relative: 1 %
Eosinophils Absolute: 0.1 10*3/uL (ref 0.0–0.5)
Eosinophils Relative: 1 %
HCT: 37.6 % — ABNORMAL LOW (ref 39.0–52.0)
Hemoglobin: 12.6 g/dL — ABNORMAL LOW (ref 13.0–17.0)
Immature Granulocytes: 0 %
Lymphocytes Relative: 32 %
Lymphs Abs: 2.6 10*3/uL (ref 0.7–4.0)
MCH: 32.8 pg (ref 26.0–34.0)
MCHC: 33.5 g/dL (ref 30.0–36.0)
MCV: 97.9 fL (ref 80.0–100.0)
Monocytes Absolute: 0.8 10*3/uL (ref 0.1–1.0)
Monocytes Relative: 9 %
Neutro Abs: 4.7 10*3/uL (ref 1.7–7.7)
Neutrophils Relative %: 57 %
Platelet Count: 166 10*3/uL (ref 150–400)
RBC: 3.84 MIL/uL — ABNORMAL LOW (ref 4.22–5.81)
RDW: 12.8 % (ref 11.5–15.5)
WBC Count: 8.2 10*3/uL (ref 4.0–10.5)
nRBC: 0 % (ref 0.0–0.2)

## 2023-03-21 MED ORDER — LEUPROLIDE ACETATE (3 MONTH) 22.5 MG ~~LOC~~ KIT
22.5000 mg | PACK | Freq: Once | SUBCUTANEOUS | Status: AC
Start: 2023-03-21 — End: 2023-03-21
  Administered 2023-03-21: 22.5 mg via SUBCUTANEOUS
  Filled 2023-03-21: qty 22.5

## 2023-03-21 MED ORDER — DENOSUMAB 120 MG/1.7ML ~~LOC~~ SOLN
120.0000 mg | Freq: Once | SUBCUTANEOUS | Status: AC
Start: 2023-03-21 — End: 2023-03-21
  Administered 2023-03-21: 120 mg via SUBCUTANEOUS
  Filled 2023-03-21: qty 1.7

## 2023-03-21 MED ORDER — LEUPROLIDE ACETATE (3 MONTH) 22.5 MG ~~LOC~~ KIT: 22.5000 mg | PACK | Freq: Once | SUBCUTANEOUS | Status: DC

## 2023-03-21 MED ORDER — DENOSUMAB 120 MG/1.7ML ~~LOC~~ SOLN
120.0000 mg | Freq: Once | SUBCUTANEOUS | Status: DC
Start: 2023-03-21 — End: 2023-03-21

## 2023-03-21 NOTE — Patient Instructions (Signed)
Denosumab Injection (Oncology) What is this medication? DENOSUMAB (den oh SUE mab) prevents weakened bones caused by cancer. It may also be used to treat noncancerous bone tumors that cannot be removed by surgery. It can also be used to treat high calcium levels in the blood caused by cancer. It works by blocking a protein that causes bones to break down quickly. This slows down the release of calcium from bones, which lowers calcium levels in your blood. It also makes your bones stronger and less likely to break (fracture). This medicine may be used for other purposes; ask your health care provider or pharmacist if you have questions. COMMON BRAND NAME(S): XGEVA What should I tell my care team before I take this medication? They need to know if you have any of these conditions: Dental disease Having surgery or tooth extraction Infection Kidney disease Low levels of calcium or vitamin D in the blood Malnutrition On hemodialysis Skin conditions or sensitivity Thyroid or parathyroid disease An unusual reaction to denosumab, other medications, foods, dyes, or preservatives Pregnant or trying to get pregnant Breast-feeding How should I use this medication? This medication is for injection under the skin. It is given by your care team in a hospital or clinic setting. A special MedGuide will be given to you before each treatment. Be sure to read this information carefully each time. Talk to your care team about the use of this medication in children. While it may be prescribed for children as young as 13 years for selected conditions, precautions do apply. Overdosage: If you think you have taken too much of this medicine contact a poison control center or emergency room at once. NOTE: This medicine is only for you. Do not share this medicine with others. What if I miss a dose? Keep appointments for follow-up doses. It is important not to miss your dose. Call your care team if you are unable to  keep an appointment. What may interact with this medication? Do not take this medication with any of the following: Other medications containing denosumab This medication may also interact with the following: Medications that lower your chance of fighting infection Steroid medications, such as prednisone or cortisone This list may not describe all possible interactions. Give your health care provider a list of all the medicines, herbs, non-prescription drugs, or dietary supplements you use. Also tell them if you smoke, drink alcohol, or use illegal drugs. Some items may interact with your medicine. What should I watch for while using this medication? Your condition will be monitored carefully while you are receiving this medication. You may need blood work while taking this medication. This medication may increase your risk of getting an infection. Call your care team for advice if you get a fever, chills, sore throat, or other symptoms of a cold or flu. Do not treat yourself. Try to avoid being around people who are sick. You should make sure you get enough calcium and vitamin D while you are taking this medication, unless your care team tells you not to. Discuss the foods you eat and the vitamins you take with your care team. Some people who take this medication have severe bone, joint, or muscle pain. This medication may also increase your risk for jaw problems or a broken thigh bone. Tell your care team right away if you have severe pain in your jaw, bones, joints, or muscles. Tell your care team if you have any pain that does not go away or that gets worse. Talk  to your care team if you may be pregnant. Serious birth defects can occur if you take this medication during pregnancy and for 5 months after the last dose. You will need a negative pregnancy test before starting this medication. Contraception is recommended while taking this medication and for 5 months after the last dose. Your care team  can help you find the option that works for you. What side effects may I notice from receiving this medication? Side effects that you should report to your care team as soon as possible: Allergic reactions--skin rash, itching, hives, swelling of the face, lips, tongue, or throat Bone, joint, or muscle pain Low calcium level--muscle pain or cramps, confusion, tingling, or numbness in the hands or feet Osteonecrosis of the jaw--pain, swelling, or redness in the mouth, numbness of the jaw, poor healing after dental work, unusual discharge from the mouth, visible bones in the mouth Side effects that usually do not require medical attention (report to your care team if they continue or are bothersome): Cough Diarrhea Fatigue Headache Nausea This list may not describe all possible side effects. Call your doctor for medical advice about side effects. You may report side effects to FDA at 1-800-FDA-1088. Where should I keep my medication? This medication is given in a hospital or clinic. It will not be stored at home. NOTE: This sheet is a summary. It may not cover all possible information. If you have questions about this medicine, talk to your doctor, pharmacist, or health care provider.  2024 Elsevier/Gold Standard (2021-07-11 00:00:00)Leuprolide Suspension for Injection (Prostate Cancer) What is this medication? LEUPROLIDE (loo PROE lide) reduces the symptoms of prostate cancer. It works by decreasing levels of the hormone testosterone in the body. This prevents prostate cancer cells from spreading or growing. This medicine may be used for other purposes; ask your health care provider or pharmacist if you have questions. COMMON BRAND NAME(S): Eligard, Lupron Depot, Lutrate Depot What should I tell my care team before I take this medication? They need to know if you have any of these conditions: Diabetes Heart disease Heart failure High or low levels of electrolytes, such as magnesium,  potassium, or sodium in your blood Irregular heartbeat or rhythm Seizures An unusual or allergic reaction to leuprolide, other medications, foods, dyes, or preservatives Pregnant or trying to get pregnant Breast-feeding How should I use this medication? This medication is injected under the skin or into a muscle. It is given by your care team in a hospital or clinic setting. Talk to your care team about the use of this medication in children. Special care may be needed. Overdosage: If you think you have taken too much of this medicine contact a poison control center or emergency room at once. NOTE: This medicine is only for you. Do not share this medicine with others. What if I miss a dose? Keep appointments for follow-up doses. It is important not to miss your dose. Call your care team if you are unable to keep an appointment. What may interact with this medication? Do not take this medication with any of the following: Cisapride Dronedarone Ketoconazole Levoketoconazole Pimozide Thioridazine This medication may also interact with the following: Other medications that cause heart rhythm changes This list may not describe all possible interactions. Give your health care provider a list of all the medicines, herbs, non-prescription drugs, or dietary supplements you use. Also tell them if you smoke, drink alcohol, or use illegal drugs. Some items may interact with your medicine. What should  I watch for while using this medication? Visit your care team for regular checks on your progress. Tell your care team if your symptoms do not start to get better or if they get worse. This medication may increase blood sugar. The risk may be higher in patients who already have diabetes. Ask your care team what you can do to lower the risk of diabetes while taking this medication. This medication may cause infertility. Talk to your care team if you are concerned about your fertility. Heart attacks and  strokes have been reported with the use of this medication. Get emergency help if you develop signs or symptoms of a heart attack or stroke. Talk to your care team about the risks and benefits of this medication. What side effects may I notice from receiving this medication? Side effects that you should report to your care team as soon as possible: Allergic reactions--skin rash, itching, hives, swelling of the face, lips, tongue, or throat Heart attack--pain or tightness in the chest, shoulders, arms, or jaw, nausea, shortness of breath, cold or clammy skin, feeling faint or lightheaded Heart rhythm changes--fast or irregular heartbeat, dizziness, feeling faint or lightheaded, chest pain, trouble breathing High blood sugar (hyperglycemia)--increased thirst or amount of urine, unusual weakness or fatigue, blurry vision New or worsening seizures Redness, blistering, peeling, or loosening of the skin, including inside the mouth Stroke--sudden numbness or weakness of the face, arm, or leg, trouble speaking, confusion, trouble walking, loss of balance or coordination, dizziness, severe headache, change in vision Swelling and pain of the tumor site or lymph nodes Side effects that usually do not require medical attention (report these to your care team if they continue or are bothersome): Change in sex drive or performance Hot flashes Joint pain Pain, redness, or irritation at injection site Swelling of the ankles, hands, or feet Unusual weakness or fatigue This list may not describe all possible side effects. Call your doctor for medical advice about side effects. You may report side effects to FDA at 1-800-FDA-1088. Where should I keep my medication? This medication is given in a hospital or clinic. It will not be stored at home. NOTE: This sheet is a summary. It may not cover all possible information. If you have questions about this medicine, talk to your doctor, pharmacist, or health care  provider.  2024 Elsevier/Gold Standard (2023-01-31 00:00:00)

## 2023-03-21 NOTE — Progress Notes (Signed)
Hematology and Oncology Follow Up Visit  Randall Carter [id number] 1945-12-26 78 y.o. 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 daily-start on 09/14/2020 -- changed to 500 mg po q day on 12/11/2020 Xgeva 120 mg IM q. 3 months-next dose on 06/2023  Radiation therapy to the prostate bed -- completed on 09/21/2021     Interim History:  Randall Carter is back for his follow-up.  He looks fantastic.  He feels good.  He really has had no complaints.  He had no problems over the Holiday season.  He is going down to [place] today.  A grandson is getting married.  He has had no issues with respect to the prostate cancer.  His last PSA was still nondetectable.  The testosterone was less than 3.  His blood sugars have been on the high side.  He really needs to be careful with this.  He has had no pain.  He has had no cough or shortness of breath.  He has had no problems with COVID.  There is been no leg swelling.  He has had no rashes.  Overall, I would say his performance status is probably ECOG 1.    Medications:  Current Outpatient Medications:    abiraterone acetate (ZYTIGA) 250 MG tablet, TAKE 2 TABLETS BY MOUTH DAILY ON AN EMPTY STOMACH 1 HOUR BEFORE  OR 2 HOURS AFTER A MEAL, Disp: 60 tablet, Rfl: 6   albuterol (VENTOLIN HFA) 108 (90 Base) MCG/ACT inhaler, Inhale 2 puffs into the lungs every 6 (six) hours as needed., Disp: 18 g, Rfl: 2   amLODipine (NORVASC) 10 MG tablet, Take 1 tablet (10 mg total) by mouth daily., Disp: 90 tablet, Rfl: 3   aspirin 81 MG EC tablet, Take 81 mg by mouth daily., Disp: , Rfl:    budesonide-formoterol (SYMBICORT) 160-4.5 MCG/ACT inhaler, Inhale 2 puffs into the lungs 2 (two) times daily., Disp: 1 each, Rfl: 12   cyclobenzaprine (FLEXERIL) 5 MG tablet, Take 1 tablet (5 mg total) by mouth 3 (three) times daily as needed  for muscle spasms., Disp: 15 tablet, Rfl: 0   fexofenadine (ALLEGRA) 60 MG tablet, Take 60 mg by mouth daily., Disp: , Rfl:    hydrALAZINE (APRESOLINE) 10 MG tablet, Take 1 tablet (10 mg total) by mouth 2 (two) times daily., Disp: 30 tablet, Rfl: 0   ipratropium (ATROVENT) 0.06 % nasal spray, Place 1 spray into both nostrils 4 (four) times daily., Disp: , Rfl:    losartan (COZAAR) 100 MG tablet, Take 1 tablet (100 mg total) by mouth daily., Disp: 90 tablet, Rfl: 3   Multiple Vitamin (MULTI-VITAMIN) tablet, Take 1 tablet by mouth daily., Disp: , Rfl:    naproxen (NAPROSYN) 500 MG tablet, Take 500 mg by mouth daily as needed for mild pain (pain score 1-3) or moderate pain (pain score 4-6)., Disp: , Rfl:    nitroGLYCERIN (NITROSTAT) 0.4 MG SL tablet, Place 1 tablet (0.4 mg total) under the tongue every 5 (five) minutes as needed for chest pain., Disp: 90 tablet, Rfl: 3   predniSONE (DELTASONE) 5 MG tablet, TAKE 1 TABLET(5 MG) BY MOUTH DAILY WITH BREAKFAST, Disp: 30 tablet, Rfl: 6   rosuvastatin (CRESTOR) 20 MG tablet, Take 1 tablet (20 mg total) by mouth daily., Disp: 90 tablet, Rfl: 3   vitamin B-12 (CYANOCOBALAMIN) 500 MCG tablet, Take 500 mcg  by mouth daily., Disp: , Rfl:   Allergies:  Allergies  Allergen Reactions   Metformin And Related Diarrhea and Nausea And Vomiting   Tamsulosin Other (See Comments)    Past Medical History, Surgical history, Social history, and Family History were reviewed and updated.  Review of Systems: Review of Systems  Constitutional: Negative.   HENT:  Negative.    Eyes: Negative.   Respiratory: Negative.    Cardiovascular: Negative.   Gastrointestinal: Negative.   Endocrine: Positive for hot flashes.  Genitourinary: Negative.    Musculoskeletal: Negative.   Skin: Negative.   Neurological: Negative.   Hematological: Negative.   Psychiatric/Behavioral: Negative.      Physical Exam:  height is 5' 10.6" (1.793 m) and weight is 238 lb (108 kg). His oral  temperature is 98.6 F (37 C). His blood pressure is 143/65 (abnormal) and his pulse is 76. His respiration is 18 and oxygen saturation is 97%.   Wt Readings from Last 3 Encounters:  03/21/23 238 lb (108 kg)  03/13/23 239 lb 1.3 oz (108.4 kg)  01/21/23 240 lb 1.9 oz (108.9 kg)    Physical Exam Vitals reviewed.  HENT:     Head: Normocephalic and atraumatic.  Eyes:     Pupils: Pupils are equal, round, and reactive to light.  Cardiovascular:     Rate and Rhythm: Normal rate and regular rhythm.     Heart sounds: Normal heart sounds.  Pulmonary:     Effort: Pulmonary effort is normal.     Breath sounds: Normal breath sounds.  Abdominal:     General: Bowel sounds are normal.     Palpations: Abdomen is soft.  Musculoskeletal:        General: No tenderness or deformity. Normal range of motion.     Cervical back: Normal range of motion.  Lymphadenopathy:     Cervical: No cervical adenopathy.  Skin:    General: Skin is warm and dry.     Findings: No erythema or rash.  Neurological:     Mental Status: He is alert and oriented to person, place, and time.  Psychiatric:        Behavior: Behavior normal.        Thought Content: Thought content normal.        Judgment: Judgment normal.      Lab Results  Component Value Date   WBC 8.2 03/21/2023   HGB 12.6 (L) 03/21/2023   HCT 37.6 (L) 03/21/2023   MCV 97.9 03/21/2023   PLT 166 03/21/2023     Chemistry      Component Value Date/Time   NA 140 03/21/2023 0855   K 4.0 03/21/2023 0855   CL 105 03/21/2023 0855   CO2 24 03/21/2023 0855   BUN 22 03/21/2023 0855   CREATININE 1.20 03/21/2023 0855      Component Value Date/Time   CALCIUM 9.8 03/21/2023 0855   ALKPHOS 55 03/21/2023 0855   AST 18 03/21/2023 0855   ALT 19 03/21/2023 0855   BILITOT 0.6 03/21/2023 0855      Impression and Plan: Randall Carter is a very nice 78 year old African-American male.  He has castrate sensitive prostate cancer.  So far, I am very impressed  with how well he is done.  I do not see that we have to do any scans on right now.  I think as long as his PSA is low, and he has no symptoms, I still think that a scan will really help [place] out.  We will go ahead with his Lupron and Xgeva today.  I will then plan to get him back in 3 more months for his next appointment.    Josph Macho, MD 1/17/20259:44 AM

## 2023-03-23 LAB — PSA, TOTAL AND FREE
PSA, Free Pct: UNDETERMINED %
PSA, Free: <0.02 ng/mL
Prostate Specific Ag, Serum: <0.1 ng/mL (ref 0.0–4.0)

## 2023-03-23 LAB — TESTOSTERONE: Testosterone: <3 ng/dL — ABNORMAL LOW (ref 264–916)

## 2023-03-31 DIAGNOSIS — E7849 Other hyperlipidemia: Secondary | ICD-10-CM | POA: Diagnosis not present

## 2023-03-31 DIAGNOSIS — I2510 Atherosclerotic heart disease of native coronary artery without angina pectoris: Secondary | ICD-10-CM | POA: Diagnosis not present

## 2023-04-01 LAB — COMPREHENSIVE METABOLIC PANEL
ALT: 20 [IU]/L (ref 0–44)
AST: 19 [IU]/L (ref 0–40)
Albumin: 4.2 g/dL (ref 3.8–4.8)
Alkaline Phosphatase: 63 [IU]/L (ref 44–121)
BUN/Creatinine Ratio: 15 (ref 10–24)
BUN: 16 mg/dL (ref 8–27)
Bilirubin Total: 0.6 mg/dL (ref 0.0–1.2)
CO2: 20 mmol/L (ref 20–29)
Calcium: 9.4 mg/dL (ref 8.6–10.2)
Chloride: 105 mmol/L (ref 96–106)
Creatinine, Ser: 1.1 mg/dL (ref 0.76–1.27)
Globulin, Total: 2.9 g/dL (ref 1.5–4.5)
Glucose: 149 mg/dL — ABNORMAL HIGH (ref 70–99)
Potassium: 3.9 mmol/L (ref 3.5–5.2)
Sodium: 141 mmol/L (ref 134–144)
Total Protein: 7.1 g/dL (ref 6.0–8.5)
eGFR: 70 mL/min/{1.73_m2} (ref >59)

## 2023-04-01 LAB — LIPID PANEL
Chol/HDL Ratio: 3 {ratio} (ref 0.0–5.0)
Cholesterol, Total: 155 mg/dL (ref 100–199)
HDL: 52 mg/dL (ref >39)
LDL Chol Calc (NIH): 87 mg/dL (ref 0–99)
Triglycerides: 84 mg/dL (ref 0–149)
VLDL Cholesterol Cal: 16 mg/dL (ref 5–40)

## 2023-04-08 ENCOUNTER — Telehealth: Payer: Self-pay

## 2023-04-08 DIAGNOSIS — I2510 Atherosclerotic heart disease of native coronary artery without angina pectoris: Secondary | ICD-10-CM

## 2023-04-08 DIAGNOSIS — E7849 Other hyperlipidemia: Secondary | ICD-10-CM

## 2023-04-08 MED ORDER — ROSUVASTATIN CALCIUM 40 MG PO TABS: 40.0000 mg | ORAL_TABLET | Freq: Every day | ORAL | 3 refills | Status: DC

## 2023-04-08 NOTE — Telephone Encounter (Signed)
Viewed in MyChart Routed to PCP  

## 2023-04-08 NOTE — Telephone Encounter (Signed)
-----   Message from Delon JAYSON Hoover sent at 04/08/2023 11:32 AM EST ----- I am the nurse practitioner that works closely with Dr. Edwyna.  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  Your liver function testing was normal, so the recommendation is to increase your Crestor  to 40 mg daily.  Repeat FLP and LFT in 8 weeks.

## 2023-06-20 ENCOUNTER — Other Ambulatory Visit: Payer: Self-pay

## 2023-06-20 ENCOUNTER — Inpatient Hospital Stay: Payer: Medicare Other | Admitting: Hematology & Oncology

## 2023-06-20 ENCOUNTER — Inpatient Hospital Stay: Payer: Medicare Other | Attending: Hematology & Oncology

## 2023-06-20 ENCOUNTER — Inpatient Hospital Stay: Payer: Medicare Other

## 2023-06-20 ENCOUNTER — Encounter: Payer: Self-pay | Admitting: Hematology & Oncology

## 2023-06-20 VITALS — BP 148/82 | HR 63 | Temp 98.6°F | Wt 242.0 lb

## 2023-06-20 DIAGNOSIS — C7951 Secondary malignant neoplasm of bone: Secondary | ICD-10-CM

## 2023-06-20 DIAGNOSIS — C61 Malignant neoplasm of prostate: Secondary | ICD-10-CM

## 2023-06-20 DIAGNOSIS — C7800 Secondary malignant neoplasm of unspecified lung: Secondary | ICD-10-CM | POA: Diagnosis not present

## 2023-06-20 DIAGNOSIS — Z79899 Other long term (current) drug therapy: Secondary | ICD-10-CM | POA: Insufficient documentation

## 2023-06-20 LAB — CMP (CANCER CENTER ONLY)
ALT: 22 U/L (ref 0–44)
AST: 19 U/L (ref 15–41)
Albumin: 4.2 g/dL (ref 3.5–5.0)
Alkaline Phosphatase: 47 U/L (ref 38–126)
Anion gap: 8 (ref 5–15)
BUN: 17 mg/dL (ref 8–23)
CO2: 27 mmol/L (ref 22–32)
Calcium: 9.3 mg/dL (ref 8.9–10.3)
Chloride: 106 mmol/L (ref 98–111)
Creatinine: 1.03 mg/dL (ref 0.61–1.24)
GFR, Estimated: >60 mL/min (ref >60)
Glucose, Bld: 193 mg/dL — ABNORMAL HIGH (ref 70–99)
Potassium: 3.8 mmol/L (ref 3.5–5.1)
Sodium: 141 mmol/L (ref 135–145)
Total Bilirubin: 0.6 mg/dL (ref 0.0–1.2)
Total Protein: 7.1 g/dL (ref 6.5–8.1)

## 2023-06-20 LAB — CBC WITH DIFFERENTIAL (CANCER CENTER ONLY)
Abs Immature Granulocytes: 0.03 10*3/uL (ref 0.00–0.07)
Basophils Absolute: 0 10*3/uL (ref 0.0–0.1)
Basophils Relative: 0 %
Eosinophils Absolute: 0.1 10*3/uL (ref 0.0–0.5)
Eosinophils Relative: 1 %
HCT: 37.1 % — ABNORMAL LOW (ref 39.0–52.0)
Hemoglobin: 12.4 g/dL — ABNORMAL LOW (ref 13.0–17.0)
Immature Granulocytes: 0 %
Lymphocytes Relative: 29 %
Lymphs Abs: 2.3 10*3/uL (ref 0.7–4.0)
MCH: 32.5 pg (ref 26.0–34.0)
MCHC: 33.4 g/dL (ref 30.0–36.0)
MCV: 97.4 fL (ref 80.0–100.0)
Monocytes Absolute: 0.7 10*3/uL (ref 0.1–1.0)
Monocytes Relative: 8 %
Neutro Abs: 4.9 10*3/uL (ref 1.7–7.7)
Neutrophils Relative %: 62 %
Platelet Count: 165 10*3/uL (ref 150–400)
RBC: 3.81 MIL/uL — ABNORMAL LOW (ref 4.22–5.81)
RDW: 12.8 % (ref 11.5–15.5)
WBC Count: 7.9 10*3/uL (ref 4.0–10.5)
nRBC: 0 % (ref 0.0–0.2)

## 2023-06-20 MED ORDER — DENOSUMAB 120 MG/1.7ML ~~LOC~~ SOLN
120.0000 mg | Freq: Once | SUBCUTANEOUS | Status: AC
  Administered 2023-06-20: 120 mg via SUBCUTANEOUS
  Filled 2023-06-20: qty 1.7

## 2023-06-20 MED ORDER — LEUPROLIDE ACETATE (3 MONTH) 22.5 MG ~~LOC~~ KIT
22.5000 mg | PACK | Freq: Once | SUBCUTANEOUS | Status: AC
  Administered 2023-06-20: 22.5 mg via SUBCUTANEOUS
  Filled 2023-06-20: qty 22.5

## 2023-06-20 NOTE — Patient Instructions (Signed)
 Denosumab Injection (Oncology) What is this medication? DENOSUMAB (den oh SUE mab) prevents weakened bones caused by cancer. It may also be used to treat noncancerous bone tumors that cannot be removed by surgery. It can also be used to treat high calcium levels in the blood caused by cancer. It works by blocking a protein that causes bones to break down quickly. This slows down the release of calcium from bones, which lowers calcium levels in your blood. It also makes your bones stronger and less likely to break (fracture). This medicine may be used for other purposes; ask your health care provider or pharmacist if you have questions. COMMON BRAND NAME(S): XGEVA What should I tell my care team before I take this medication? They need to know if you have any of these conditions: Dental disease Having surgery or tooth extraction Infection Kidney disease Low levels of calcium or vitamin D in the blood Malnutrition On hemodialysis Skin conditions or sensitivity Thyroid or parathyroid disease An unusual reaction to denosumab, other medications, foods, dyes, or preservatives Pregnant or trying to get pregnant Breast-feeding How should I use this medication? This medication is for injection under the skin. It is given by your care team in a hospital or clinic setting. A special MedGuide will be given to you before each treatment. Be sure to read this information carefully each time. Talk to your care team about the use of this medication in children. While it may be prescribed for children as young as 13 years for selected conditions, precautions do apply. Overdosage: If you think you have taken too much of this medicine contact a poison control center or emergency room at once. NOTE: This medicine is only for you. Do not share this medicine with others. What if I miss a dose? Keep appointments for follow-up doses. It is important not to miss your dose. Call your care team if you are unable to  keep an appointment. What may interact with this medication? Do not take this medication with any of the following: Other medications containing denosumab This medication may also interact with the following: Medications that lower your chance of fighting infection Steroid medications, such as prednisone or cortisone This list may not describe all possible interactions. Give your health care provider a list of all the medicines, herbs, non-prescription drugs, or dietary supplements you use. Also tell them if you smoke, drink alcohol, or use illegal drugs. Some items may interact with your medicine. What should I watch for while using this medication? Your condition will be monitored carefully while you are receiving this medication. You may need blood work while taking this medication. This medication may increase your risk of getting an infection. Call your care team for advice if you get a fever, chills, sore throat, or other symptoms of a cold or flu. Do not treat yourself. Try to avoid being around people who are sick. You should make sure you get enough calcium and vitamin D while you are taking this medication, unless your care team tells you not to. Discuss the foods you eat and the vitamins you take with your care team. Some people who take this medication have severe bone, joint, or muscle pain. This medication may also increase your risk for jaw problems or a broken thigh bone. Tell your care team right away if you have severe pain in your jaw, bones, joints, or muscles. Tell your care team if you have any pain that does not go away or that gets worse. Talk  to your care team if you may be pregnant. Serious birth defects can occur if you take this medication during pregnancy and for 5 months after the last dose. You will need a negative pregnancy test before starting this medication. Contraception is recommended while taking this medication and for 5 months after the last dose. Your care team  can help you find the option that works for you. What side effects may I notice from receiving this medication? Side effects that you should report to your care team as soon as possible: Allergic reactions--skin rash, itching, hives, swelling of the face, lips, tongue, or throat Bone, joint, or muscle pain Low calcium level--muscle pain or cramps, confusion, tingling, or numbness in the hands or feet Osteonecrosis of the jaw--pain, swelling, or redness in the mouth, numbness of the jaw, poor healing after dental work, unusual discharge from the mouth, visible bones in the mouth Side effects that usually do not require medical attention (report to your care team if they continue or are bothersome): Cough Diarrhea Fatigue Headache Nausea This list may not describe all possible side effects. Call your doctor for medical advice about side effects. You may report side effects to FDA at 1-800-FDA-1088. Where should I keep my medication? This medication is given in a hospital or clinic. It will not be stored at home. NOTE: This sheet is a summary. It may not cover all possible information. If you have questions about this medicine, talk to your doctor, pharmacist, or health care provider.  2024 Elsevier/Gold Standard (2021-07-11 00:00:00)Leuprolide Suspension for Injection (Prostate Cancer) What is this medication? LEUPROLIDE (loo PROE lide) reduces the symptoms of prostate cancer. It works by decreasing levels of the hormone testosterone in the body. This prevents prostate cancer cells from spreading or growing. This medicine may be used for other purposes; ask your health care provider or pharmacist if you have questions. COMMON BRAND NAME(S): Eligard, Lupron Depot, Lutrate Depot What should I tell my care team before I take this medication? They need to know if you have any of these conditions: Diabetes Heart disease Heart failure High or low levels of electrolytes, such as magnesium,  potassium, or sodium in your blood Irregular heartbeat or rhythm Seizures An unusual or allergic reaction to leuprolide, other medications, foods, dyes, or preservatives Pregnant or trying to get pregnant Breast-feeding How should I use this medication? This medication is injected under the skin or into a muscle. It is given by your care team in a hospital or clinic setting. Talk to your care team about the use of this medication in children. Special care may be needed. Overdosage: If you think you have taken too much of this medicine contact a poison control center or emergency room at once. NOTE: This medicine is only for you. Do not share this medicine with others. What if I miss a dose? Keep appointments for follow-up doses. It is important not to miss your dose. Call your care team if you are unable to keep an appointment. What may interact with this medication? Do not take this medication with any of the following: Cisapride Dronedarone Ketoconazole Levoketoconazole Pimozide Thioridazine This medication may also interact with the following: Other medications that cause heart rhythm changes This list may not describe all possible interactions. Give your health care provider a list of all the medicines, herbs, non-prescription drugs, or dietary supplements you use. Also tell them if you smoke, drink alcohol, or use illegal drugs. Some items may interact with your medicine. What should  I watch for while using this medication? Visit your care team for regular checks on your progress. Tell your care team if your symptoms do not start to get better or if they get worse. This medication may increase blood sugar. The risk may be higher in patients who already have diabetes. Ask your care team what you can do to lower the risk of diabetes while taking this medication. This medication may cause infertility. Talk to your care team if you are concerned about your fertility. Heart attacks and  strokes have been reported with the use of this medication. Get emergency help if you develop signs or symptoms of a heart attack or stroke. Talk to your care team about the risks and benefits of this medication. What side effects may I notice from receiving this medication? Side effects that you should report to your care team as soon as possible: Allergic reactions--skin rash, itching, hives, swelling of the face, lips, tongue, or throat Heart attack--pain or tightness in the chest, shoulders, arms, or jaw, nausea, shortness of breath, cold or clammy skin, feeling faint or lightheaded Heart rhythm changes--fast or irregular heartbeat, dizziness, feeling faint or lightheaded, chest pain, trouble breathing High blood sugar (hyperglycemia)--increased thirst or amount of urine, unusual weakness or fatigue, blurry vision New or worsening seizures Redness, blistering, peeling, or loosening of the skin, including inside the mouth Stroke--sudden numbness or weakness of the face, arm, or leg, trouble speaking, confusion, trouble walking, loss of balance or coordination, dizziness, severe headache, change in vision Swelling and pain of the tumor site or lymph nodes Side effects that usually do not require medical attention (report these to your care team if they continue or are bothersome): Change in sex drive or performance Hot flashes Joint pain Pain, redness, or irritation at injection site Swelling of the ankles, hands, or feet Unusual weakness or fatigue This list may not describe all possible side effects. Call your doctor for medical advice about side effects. You may report side effects to FDA at 1-800-FDA-1088. Where should I keep my medication? This medication is given in a hospital or clinic. It will not be stored at home. NOTE: This sheet is a summary. It may not cover all possible information. If you have questions about this medicine, talk to your doctor, pharmacist, or health care  provider.  2024 Elsevier/Gold Standard (2023-01-31 00:00:00)

## 2023-06-20 NOTE — Progress Notes (Signed)
 Hematology and Oncology Follow Up Visit  Randall Randall Carter [id number] 02-27-1946 78 y.o. 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 daily-start on 09/14/2020 -- changed to 500 mg po q day on 12/11/2020 Xgeva  120 mg IM q. 3 months-next dose on 09/2023  Radiation therapy to the prostate bed -- completed on 09/21/2021     Interim History:  Randall Randall Carter is back for his follow-up.  I am so happy for him.  He really looks good.  He has had a grandson get married.  Another resident will get married in the Summer.  It sounds like he might be a great grandfather in the near future.  He has had no problems with the Lupron  and Zytiga .  He does have hot flashes.  The last time that we saw him, his PSA was less than 0.1.  His testosterone  was less than 3.  His appetite has been okay.  He and his wife will be going down to [facility] in early May.  I am sure that they will have a wonderful time.  He has had no change in bowel or bladder habits.  He has had no fever.  He has had no bleeding.  He has had no swollen lymph nodes.  He has had no leg swelling.  Overall, I was able his performance status of prior ECOG 0.     Medications:  Current Outpatient Medications:    abiraterone  acetate (ZYTIGA ) 250 MG tablet, TAKE 2 TABLETS BY MOUTH DAILY ON AN EMPTY STOMACH 1 HOUR BEFORE  OR 2 HOURS AFTER A MEAL, Disp: 60 tablet, Rfl: 6   albuterol  (VENTOLIN  HFA) 108 (90 Base) MCG/ACT inhaler, Inhale 2 puffs into the lungs every 6 (six) hours as needed., Disp: 18 g, Rfl: 2   amLODipine  (NORVASC ) 10 MG tablet, Take 1 tablet (10 mg total) by mouth daily., Disp: 90 tablet, Rfl: 3   aspirin 81 MG EC tablet, Take 81 mg by mouth daily., Disp: , Rfl:    budesonide -formoterol  (SYMBICORT ) 160-4.5 MCG/ACT inhaler, Inhale 2 puffs into the lungs 2 (two) times daily., Disp: 1 each, Rfl: 12    cyclobenzaprine  (FLEXERIL ) 5 MG tablet, Take 1 tablet (5 mg total) by mouth 3 (three) times daily as needed for muscle spasms., Disp: 15 tablet, Rfl: 0   fexofenadine (ALLEGRA) 60 MG tablet, Take 60 mg by mouth daily., Disp: , Rfl:    hydrALAZINE  (APRESOLINE ) 10 MG tablet, Take 1 tablet (10 mg total) by mouth 2 (two) times daily., Disp: 30 tablet, Rfl: 0   ipratropium (ATROVENT) 0.06 % nasal spray, Place 1 spray into both nostrils 4 (four) times daily., Disp: , Rfl:    losartan  (COZAAR ) 100 MG tablet, Take 1 tablet (100 mg total) by mouth daily., Disp: 90 tablet, Rfl: 3   Multiple Vitamin (MULTI-VITAMIN) tablet, Take 1 tablet by mouth daily., Disp: , Rfl:    naproxen (NAPROSYN) 500 MG tablet, Take 500 mg by mouth daily as needed for mild pain (pain score 1-3) or moderate pain (pain score 4-6)., Disp: , Rfl:    nitroGLYCERIN  (NITROSTAT ) 0.4 MG SL tablet, Place 1 tablet (0.4 mg total) under the tongue every 5 (five) minutes as needed for chest pain., Disp: 90 tablet, Rfl: 3   predniSONE  (DELTASONE ) 5 MG tablet, TAKE 1 TABLET(5 MG) BY MOUTH DAILY WITH BREAKFAST, Disp: 30 tablet, Rfl: 6  rosuvastatin  (CRESTOR ) 40 MG tablet, Take 1 tablet (40 mg total) by mouth daily., Disp: 90 tablet, Rfl: 3   vitamin B-12 (CYANOCOBALAMIN) 500 MCG tablet, Take 500 mcg by mouth daily., Disp: , Rfl:   Allergies:  Allergies  Allergen Reactions   Metformin  And Related Diarrhea and Nausea And Vomiting   Tamsulosin Other (See Comments)    Past Medical History, Surgical history, Social history, and Family History were reviewed and updated.  Review of Systems: Review of Systems  Constitutional: Negative.   HENT:  Negative.    Eyes: Negative.   Respiratory: Negative.    Cardiovascular: Negative.   Gastrointestinal: Negative.   Endocrine: Positive for hot flashes.  Genitourinary: Negative.    Musculoskeletal: Negative.   Skin: Negative.   Neurological: Negative.   Hematological: Negative.    Psychiatric/Behavioral: Negative.      Physical Exam:  Vital signs show temperature of 98.6.  Pulse 63.  Blood pressure 148/82.  Weight is 242 pounds.  Wt Readings from Last 3 Encounters:  03/21/23 238 lb (108 kg)  03/13/23 239 lb 1.3 oz (108.4 kg)  01/21/23 240 lb 1.9 oz (108.9 kg)    Physical Exam Vitals reviewed.  HENT:     Head: Normocephalic and atraumatic.  Eyes:     Pupils: Pupils are equal, round, and reactive to light.  Cardiovascular:     Rate and Rhythm: Normal rate and regular rhythm.     Heart sounds: Normal heart sounds.  Pulmonary:     Effort: Pulmonary effort is normal.     Breath sounds: Normal breath sounds.  Abdominal:     General: Bowel sounds are normal.     Palpations: Abdomen is soft.  Musculoskeletal:        General: No tenderness or deformity. Normal range of motion.     Cervical back: Normal range of motion.  Lymphadenopathy:     Cervical: No cervical adenopathy.  Skin:    General: Skin is warm and dry.     Findings: No erythema or rash.  Neurological:     Mental Status: He is alert and oriented to person, place, and time.  Psychiatric:        Behavior: Behavior normal.        Thought Content: Thought content normal.        Judgment: Judgment normal.     Lab Results  Component Value Date   WBC 7.9 06/20/2023   HGB 12.4 (L) 06/20/2023   HCT 37.1 (L) 06/20/2023   MCV 97.4 06/20/2023   PLT 165 06/20/2023     Chemistry      Component Value Date/Time   NA 141 03/31/2023 1142   K 3.9 03/31/2023 1142   CL 105 03/31/2023 1142   CO2 20 03/31/2023 1142   BUN 16 03/31/2023 1142   CREATININE 1.10 03/31/2023 1142   CREATININE 1.20 03/21/2023 0855      Component Value Date/Time   CALCIUM  9.4 03/31/2023 1142   ALKPHOS 63 03/31/2023 1142   AST 19 03/31/2023 1142   AST 18 03/21/2023 0855   ALT 20 03/31/2023 1142   ALT 19 03/21/2023 0855   BILITOT 0.6 03/31/2023 1142   BILITOT 0.6 03/21/2023 0855      Impression and Plan: Mr.  Randall Carter is a very nice 78 year old African-American male.  He has castrate sensitive prostate cancer.  So far, everything is going quite well for him.  His PSA really has been undetectable.  He is tolerating the treatments well.  His  hemoglobin is dropping slowly.  If this is him that we are going to have to watch.  We do not have to intervene right now.  Will still plan for another follow-up in 3 months.  I really do not see that we need to do any scans on him.  Am so happy that he is doing well.  I am happy that he is enjoying marriage.  Maude JONELLE Crease, MD 4/18/20259:28 AM

## 2023-06-21 LAB — TESTOSTERONE: Testosterone: <3 ng/dL — ABNORMAL LOW (ref 264–916)

## 2023-06-21 LAB — PSA, TOTAL AND FREE
PSA, Free Pct: UNDETERMINED %
PSA, Free: <0.02 ng/mL
Prostate Specific Ag, Serum: <0.1 ng/mL (ref 0.0–4.0)

## 2023-06-23 ENCOUNTER — Telehealth: Payer: Self-pay

## 2023-06-23 NOTE — Telephone Encounter (Signed)
-----   Message from Randall Carter sent at 06/23/2023  6:49 AM EDT ----- Please call and let him know that the PSA is still not detectable.  Great job.  [name]

## 2023-06-23 NOTE — Telephone Encounter (Signed)
 Advised via MyChart.

## 2023-08-22 ENCOUNTER — Other Ambulatory Visit: Payer: Self-pay | Admitting: Hematology & Oncology

## 2023-08-22 DIAGNOSIS — C7951 Secondary malignant neoplasm of bone: Secondary | ICD-10-CM

## 2023-09-04 ENCOUNTER — Other Ambulatory Visit: Payer: Self-pay | Admitting: Hematology & Oncology

## 2023-09-04 DIAGNOSIS — C61 Malignant neoplasm of prostate: Secondary | ICD-10-CM

## 2023-09-10 ENCOUNTER — Ambulatory Visit (INDEPENDENT_AMBULATORY_CARE_PROVIDER_SITE_OTHER)

## 2023-09-10 VITALS — BP 148/82 | Ht 70.5 in | Wt 242.0 lb

## 2023-09-10 DIAGNOSIS — Z0000 Encounter for general adult medical examination without abnormal findings: Secondary | ICD-10-CM

## 2023-09-10 NOTE — Progress Notes (Signed)
 Because this visit was a virtual/telehealth visit,  certain criteria was not obtained, such a blood pressure, CBG if applicable, and timed get up and go. Any medications not marked as taking were not mentioned during the medication reconciliation part of the visit. Any vitals not documented were not able to be obtained due to this being a telehealth visit or patient was unable to self-report a recent blood pressure reading due to a lack of equipment at home via telehealth. Vitals that have been documented are verbally provided by the patient.   This visit was performed by a medical professional under my direct supervision. I was immediately available for consultation/collaboration. I have reviewed and agree with the Annual Wellness Visit documentation.  Subjective:   Randall Carter is a 78 y.o. who presents for a Medicare Wellness preventive visit.  As a reminder, Annual Wellness Visits don't include a physical exam, and some assessments may be limited, especially if this visit is performed virtually. We may recommend an in-person follow-up visit with your provider if needed.  Visit Complete: Virtual I connected with  [name] on 09/10/23 by a audio enabled telemedicine application and verified that I am speaking with the correct person using two identifiers.  Patient Location: Home  Provider Location: Home Office  I discussed the limitations of evaluation and management by telemedicine. The patient expressed understanding and agreed to proceed.  Vital Signs: Because this visit was a virtual/telehealth visit, some criteria may be missing or patient reported. Any vitals not documented were not able to be obtained and vitals that have been documented are patient reported.  VideoDeclined- This patient declined Librarian, academic. Therefore the visit was completed with audio only.  Persons Participating in Visit: Patient.  AWV Questionnaire: Yes: Patient  Medicare AWV questionnaire was completed by the patient on 09/07/2023; I have confirmed that all information answered by patient is correct and no changes since this date.  Cardiac Risk Factors include: male gender;advanced age (>44men, >83 women);hypertension;diabetes mellitus;dyslipidemia;obesity (BMI >30kg/m2)     Objective:    Today's Vitals   09/10/23 1444  BP: (!) 148/82  Weight: 242 lb (109.8 kg)  Height: 5' 10.5 (1.791 m)   Body mass index is 34.23 kg/m.     09/10/2023    2:48 PM 06/20/2023    9:18 AM 03/21/2023    9:25 AM 12/20/2022    9:40 AM 10/01/2022    3:08 PM 09/20/2022    9:44 AM 06/21/2022    8:58 AM  Advanced Directives  Does Patient Have a Medical Advance Directive? No No No No No No No  Would patient like information on creating a medical advance directive? No - Patient declined No - Patient declined No - Patient declined No - Patient declined No - Patient declined No - Patient declined No - Patient declined    Current Medications (verified) Outpatient Encounter Medications as of 09/10/2023  Medication Sig   abiraterone  acetate (ZYTIGA ) 250 MG tablet TAKE 2 TABLETS BY MOUTH DAILY ON AN EMPTY STOMACH 1 HOUR BEFORE  OR 2 HOURS AFTER A MEAL   albuterol  (VENTOLIN  HFA) 108 (90 Base) MCG/ACT inhaler Inhale 2 puffs into the lungs every 6 (six) hours as needed.   aspirin 81 MG EC tablet Take 81 mg by mouth daily.   budesonide -formoterol  (SYMBICORT ) 160-4.5 MCG/ACT inhaler Inhale 2 puffs into the lungs 2 (two) times daily.   cyclobenzaprine  (FLEXERIL ) 5 MG tablet Take 1 tablet (5 mg total) by mouth 3 (three)  times daily as needed for muscle spasms.   fexofenadine (ALLEGRA) 60 MG tablet Take 60 mg by mouth daily.   hydrALAZINE  (APRESOLINE ) 10 MG tablet Take 1 tablet (10 mg total) by mouth 2 (two) times daily.   ipratropium (ATROVENT) 0.06 % nasal spray Place 1 spray into both nostrils 4 (four) times daily.   losartan  (COZAAR ) 100 MG tablet Take 1 tablet (100 mg total) by  mouth daily.   Multiple Vitamin (MULTI-VITAMIN) tablet Take 1 tablet by mouth daily.   naproxen (NAPROSYN) 500 MG tablet Take 500 mg by mouth daily as needed for mild pain (pain score 1-3) or moderate pain (pain score 4-6).   nitroGLYCERIN  (NITROSTAT ) 0.4 MG SL tablet Place 1 tablet (0.4 mg total) under the tongue every 5 (five) minutes as needed for chest pain.   predniSONE  (DELTASONE ) 5 MG tablet TAKE 1 TABLET(5 MG) BY MOUTH DAILY WITH BREAKFAST   rosuvastatin  (CRESTOR ) 40 MG tablet Take 1 tablet (40 mg total) by mouth daily.   vitamin B-12 (CYANOCOBALAMIN) 500 MCG tablet Take 500 mcg by mouth daily.   amLODipine  (NORVASC ) 10 MG tablet Take 1 tablet (10 mg total) by mouth daily.   No facility-administered encounter medications on file as of 09/10/2023.    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   History of radiation therapy 05/03/2022   Hyperlipidemia associated with type 2 diabetes mellitus (HCC) 04/05/2015   Hypertension associated with type 2 diabetes mellitus (HCC) 04/05/2015   Mild intermittent asthma without complication 04/05/2015   Obesity (BMI 30.0-34.9) 01/09/2023   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   Prostate cancer (HCC) 01/09/2023   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   Prostate cancer metastatic to multiple sites [facility]) 07/27/2020   Receiving treatment at [facility] in [place]      Thrombocytopenia [facility]) 03/26/2020   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) 04/05/2015   Vitamin B12 deficiency 03/26/2020   Vitamin D insufficiency 03/26/2020   Past Surgical History:  Procedure Laterality Date   APPENDECTOMY     Family History  Problem Relation Age of Onset   Diabetes Mother    Hypertension Father    Heart disease Neg Hx    Cancer Neg Hx    Social History   Socioeconomic History   Marital status: Married    Spouse name: Not on file   Number of children: Not on file   Years of education: Not on file   Highest education level: Associate degree: occupational, Scientist, product/process development, or vocational program  Occupational History   Not on file  Tobacco Use   Smoking status: Never    Passive exposure: Never   Smokeless tobacco: Never  Vaping Use   Vaping status: Never Used  Substance and Sexual Activity   Alcohol use: Not Currently   Drug use: Not Currently   Sexual activity: Not Currently  Other Topics Concern   Not on file  Social History Narrative   Not on file   Social Drivers  of Health   Financial Resource Strain: Low Risk  (09/07/2023)   Overall Financial Resource Strain (CARDIA)    Difficulty of Paying Living Expenses: Not hard at all  Food Insecurity: No Food Insecurity (09/07/2023)   Hunger Vital Sign    Worried About Running Out of Food in the Last Year: Never true    Ran Out of Food in the Last Year: Never true  Transportation Needs: No Transportation Needs (09/07/2023)   PRAPARE - Administrator, Civil Service (Medical): No    Lack of Transportation (Non-Medical): No  Physical Activity: Sufficiently Active (09/07/2023)   Exercise Vital Sign    Days of Exercise per Week: 4 days    Minutes of Exercise per Session: 40 min  Stress: No Stress Concern Present (09/07/2023)   [employer] of Occupational Health - Occupational Stress Questionnaire    Feeling of Stress: Not at all  Social Connections: Socially Integrated  (09/07/2023)   Social Connection and Isolation Panel    Frequency of Communication with Friends and Family: Twice a week    Frequency of Social Gatherings with Friends and Family: Once a week    Attends Religious Services: 1 to 4 times per year    Active Member of [employer] or Organizations: Yes    Attends Engineer, structural: More than 4 times per year    Marital Status: Married    Tobacco Counseling Counseling given: Not Answered    Clinical Intake:  Pre-visit preparation completed: Yes  Pain : No/denies pain     BMI - recorded: 34.23 Nutritional Status: BMI > 30  Obese Nutritional Risks: None Diabetes: No  Lab Results  Component Value Date   HGBA1C 6.7 (H) 12/18/2022   HGBA1C 8.4 (H) 07/19/2022   HGBA1C 8.3 (H) 11/22/2021     How often do you need to have someone help you when you read instructions, pamphlets, or other written materials from your doctor or pharmacy?: 1 - Never What is the last grade level you completed in school?: Some college  Interpreter Needed?: No  Information entered by :: Fionn Stracke,cma   Activities of Daily Living     09/07/2023    8:27 PM 10/01/2022    3:01 PM  In your present state of health, do you have any difficulty performing the following activities:  Hearing? 0 0  Vision? 0 0  Difficulty concentrating or making decisions? 0 0  Walking or climbing stairs? 0 0  Dressing or bathing? 0 0  Doing errands, shopping? 0 0  Preparing Food and eating ? N N  Using the Toilet? N N  In the past six months, have you accidently leaked urine? N N  Do you have problems with loss of bowel control? N N  Managing your Medications? N N  Managing your Finances? N N  Housekeeping or managing your Housekeeping? N N    Patient Care Team: Saguier, Edward, PA-C as PCP - General (Internal Medicine) Timmy Maude SAUNDERS, MD as Medical Oncologist (Oncology)  I have updated your Care Teams any recent Medical Services you may have received from  other providers in the past year.     Assessment:   This is a routine wellness examination for [place].  Hearing/Vision screen Hearing Screening - Comments:: No difficulties  Vision Screening - Comments:: Patient wears glasses    Goals Addressed             This Visit's Progress    Patient Stated  To purchase a house       Depression Screen     09/10/2023    2:49 PM 10/01/2022    3:08 PM 11/22/2021   10:07 AM  PHQ 2/9 Scores  PHQ - 2 Score 0 0 0  PHQ- 9 Score 0      Fall Risk     09/07/2023    8:27 PM 10/01/2022    3:04 PM 11/22/2021   10:08 AM  Fall Risk   Falls in the past year? 0 0 0  Number falls in past yr: 0 0 0  Injury with Fall? 0 0 0  Risk for fall due to : No Fall Risks No Fall Risks No Fall Risks  Follow up Falls evaluation completed Falls evaluation completed Falls evaluation completed      Data saved with a previous flowsheet row definition    MEDICARE RISK AT HOME:  Medicare Risk at Home Any stairs in or around the home?: (Patient-Rptd) Yes If so, are there any without handrails?: (Patient-Rptd) No Home free of loose throw rugs in walkways, pet beds, electrical cords, etc?: (Patient-Rptd) Yes Adequate lighting in your home to reduce risk of falls?: (Patient-Rptd) Yes Life alert?: (Patient-Rptd) No Use of a cane, walker or w/c?: (Patient-Rptd) No Grab bars in the bathroom?: (Patient-Rptd) Yes Shower chair or bench in shower?: (Patient-Rptd) No Elevated toilet seat or a handicapped toilet?: (Patient-Rptd) No  TIMED UP AND GO:  Was the test performed?  No  Cognitive Function: 6CIT completed        09/10/2023    2:47 PM 10/01/2022    3:12 PM  6CIT Screen  What Year? 0 points 0 points  What month? 0 points 0 points  What time? 0 points 0 points  Count back from 20 0 points 0 points  Months in reverse 0 points 0 points  Repeat phrase 0 points 6 points  Total Score 0 points 6 points    Immunizations Immunization History  Administered  Date(s) Administered   Fluad Trivalent(High Dose 65+) 12/18/2022   Influenza Split 04/10/2015   Influenza, High Dose Seasonal PF 12/30/2013, 12/22/2015, 12/26/2016, 12/31/2017   Influenza,inj,quad, With Preservative 12/26/2018   Influenza-Unspecified 12/26/2018   Novel Infuenza-h1n1-09 03/25/2008   Pneumococcal Conjugate-13 01/26/2014   Pneumococcal Polysaccharide-23 05/11/2012   Td 10/26/2021   Td (Adult),5 Lf Tetanus Toxid, Preservative Free 07/15/2008   Tdap 10/26/2021   Zoster, Live 09/02/2008    Screening Tests Health Maintenance  Topic Date Due   COVID-19 Vaccine (1) Never done   FOOT EXAM  Never done   OPHTHALMOLOGY EXAM  Never done   Diabetic kidney evaluation - Urine ACR  Never done   Hepatitis C Screening  Never done   Zoster Vaccines- Shingrix (1 of 2) 04/13/1966   HEMOGLOBIN A1C  06/18/2023   INFLUENZA VACCINE  10/03/2023   Diabetic kidney evaluation - eGFR measurement  06/19/2024   Medicare Annual Wellness (AWV)  09/09/2024   DTaP/Tdap/Td (3 - Td or Tdap) 10/27/2031   Pneumococcal Vaccine: 50+ Years  Completed   Hepatitis B Vaccines  Aged Out   HPV VACCINES  Aged Out   Meningococcal B Vaccine  Aged Out    Health Maintenance  Health Maintenance Due  Topic Date Due   COVID-19 Vaccine (1) Never done   FOOT EXAM  Never done   OPHTHALMOLOGY EXAM  Never done   Diabetic kidney evaluation - Urine ACR  Never done   Hepatitis C Screening  Never done  Zoster Vaccines- Shingrix (1 of 2) 04/13/1966   HEMOGLOBIN A1C  06/18/2023   Health Maintenance Items Addressed:   Additional Screening:  Vision Screening: Recommended annual ophthalmology exams for early detection of glaucoma and other disorders of the eye. Would you like a referral to an eye doctor? No    Dental Screening: Recommended annual dental exams for proper oral hygiene  Community Resource Referral / Chronic Care Management: CRR required this visit?  No   CCM required this visit?  No   Plan:     I have personally reviewed and noted the following in the patient's chart:   Medical and social history Use of alcohol, tobacco or illicit drugs  Current medications and supplements including opioid prescriptions. Patient is not currently taking opioid prescriptions. Functional ability and status Nutritional status Physical activity Advanced directives List of other physicians Hospitalizations, surgeries, and ER visits in previous 12 months Vitals Screenings to include cognitive, depression, and falls Referrals and appointments  In addition, I have reviewed and discussed with patient certain preventive protocols, quality metrics, and best practice recommendations. A written personalized care plan for preventive services as well as general preventive health recommendations were provided to patient.   Lyle MARLA Right, [place]   09/10/2023   After Visit Summary: (MyChart) Due to this being a telephonic visit, the after visit summary with patients personalized plan was offered to patient via MyChart   Notes: Nothing significant to report at this time.

## 2023-09-10 NOTE — Patient Instructions (Signed)
 Randall Carter , Thank you for taking time out of your busy schedule to complete your Annual Wellness Visit with me. I enjoyed our conversation and look forward to speaking with you again next year. I, as well as your care team,  appreciate your ongoing commitment to your health goals. Please review the following plan we discussed and let me know if I can assist you in the future. Your Game plan/ To Do List    Referrals: If you haven't heard from the office you've been referred to, please reach out to them at the phone provided.  None  Follow up Visits: Next Medicare AWV with our clinical staff: 09/15/2024   Have you seen your provider in the last 6 months (3 months if uncontrolled diabetes)? No Next Office Visit with your provider: n/a  Clinician Recommendations:  Aim for 30 minutes of exercise or brisk walking, 6-8 glasses of water, and 5 servings of fruits and vegetables each day.       This is a list of the screening recommended for you and due dates:  Health Maintenance  Topic Date Due   COVID-19 Vaccine (1) Never done   Complete foot exam   Never done   Eye exam for diabetics  Never done   Yearly kidney health urinalysis for diabetes  Never done   Hepatitis C Screening  Never done   Zoster (Shingles) Vaccine (1 of 2) 04/13/1966   Hemoglobin A1C  06/18/2023   Flu Shot  10/03/2023   Yearly kidney function blood test for diabetes  06/19/2024   Medicare Annual Wellness Visit  09/09/2024   DTaP/Tdap/Td vaccine (3 - Td or Tdap) 10/27/2031   Pneumococcal Vaccine for age over 100  Completed   Hepatitis B Vaccine  Aged Out   HPV Vaccine  Aged Out   Meningitis B Vaccine  Aged Out    Advanced directives: (Declined) Advance directive discussed with you today. Even though you declined this today, please call our office should you change your mind, and we can give you the proper paperwork for you to fill out. Advance Care Planning is important because it:  [x]  Makes sure you receive the  medical care that is consistent with your values, goals, and preferences  [x]  It provides guidance to your family and loved ones and reduces their decisional burden about whether or not they are making the right decisions based on your wishes.  Follow the link provided in your after visit summary or read over the paperwork we have mailed to you to help you started getting your Advance Directives in place. If you need assistance in completing these, please reach out to us  so that we can help you!  See attachments for Preventive Care and Fall Prevention Tips.

## 2023-09-19 ENCOUNTER — Ambulatory Visit

## 2023-09-19 ENCOUNTER — Encounter: Payer: Self-pay | Admitting: Hematology & Oncology

## 2023-09-19 ENCOUNTER — Inpatient Hospital Stay: Attending: Hematology & Oncology

## 2023-09-19 ENCOUNTER — Inpatient Hospital Stay (HOSPITAL_BASED_OUTPATIENT_CLINIC_OR_DEPARTMENT_OTHER): Admitting: Hematology & Oncology

## 2023-09-19 VITALS — BP 142/72 | HR 68 | Temp 98.8°F | Resp 18 | Ht 70.0 in | Wt 240.4 lb

## 2023-09-19 DIAGNOSIS — C7951 Secondary malignant neoplasm of bone: Secondary | ICD-10-CM

## 2023-09-19 DIAGNOSIS — C61 Malignant neoplasm of prostate: Secondary | ICD-10-CM

## 2023-09-19 DIAGNOSIS — Z5111 Encounter for antineoplastic chemotherapy: Secondary | ICD-10-CM | POA: Diagnosis not present

## 2023-09-19 LAB — CBC WITH DIFFERENTIAL (CANCER CENTER ONLY)
Abs Immature Granulocytes: 0.02 K/uL (ref 0.00–0.07)
Basophils Absolute: 0 K/uL (ref 0.0–0.1)
Basophils Relative: 0 %
Eosinophils Absolute: 0.1 K/uL (ref 0.0–0.5)
Eosinophils Relative: 1 %
HCT: 37.7 % — ABNORMAL LOW (ref 39.0–52.0)
Hemoglobin: 12.5 g/dL — ABNORMAL LOW (ref 13.0–17.0)
Immature Granulocytes: 0 %
Lymphocytes Relative: 31 %
Lymphs Abs: 2.4 K/uL (ref 0.7–4.0)
MCH: 32.3 pg (ref 26.0–34.0)
MCHC: 33.2 g/dL (ref 30.0–36.0)
MCV: 97.4 fL (ref 80.0–100.0)
Monocytes Absolute: 0.6 K/uL (ref 0.1–1.0)
Monocytes Relative: 8 %
Neutro Abs: 4.6 K/uL (ref 1.7–7.7)
Neutrophils Relative %: 60 %
Platelet Count: 131 K/uL — ABNORMAL LOW (ref 150–400)
RBC: 3.87 MIL/uL — ABNORMAL LOW (ref 4.22–5.81)
RDW: 13.1 % (ref 11.5–15.5)
WBC Count: 7.8 K/uL (ref 4.0–10.5)
nRBC: 0 % (ref 0.0–0.2)

## 2023-09-19 LAB — CMP (CANCER CENTER ONLY)
ALT: 17 U/L (ref 0–44)
AST: 18 U/L (ref 15–41)
Albumin: 4.5 g/dL (ref 3.5–5.0)
Alkaline Phosphatase: 50 U/L (ref 38–126)
Anion gap: 11 (ref 5–15)
BUN: 22 mg/dL (ref 8–23)
CO2: 22 mmol/L (ref 22–32)
Calcium: 9.5 mg/dL (ref 8.9–10.3)
Chloride: 107 mmol/L (ref 98–111)
Creatinine: 1.01 mg/dL (ref 0.61–1.24)
GFR, Estimated: >60 mL/min (ref >60)
Glucose, Bld: 141 mg/dL — ABNORMAL HIGH (ref 70–99)
Potassium: 4 mmol/L (ref 3.5–5.1)
Sodium: 140 mmol/L (ref 135–145)
Total Bilirubin: 0.8 mg/dL (ref 0.0–1.2)
Total Protein: 7.5 g/dL (ref 6.5–8.1)

## 2023-09-19 MED ORDER — RELUGOLIX 120 MG PO TABS: 120.0000 mg | ORAL_TABLET | Freq: Every day | ORAL | 5 refills | Status: DC

## 2023-09-19 MED ORDER — LEUPROLIDE ACETATE (3 MONTH) 22.5 MG ~~LOC~~ KIT
22.5000 mg | PACK | Freq: Once | SUBCUTANEOUS | Status: AC
  Administered 2023-09-19: 22.5 mg via SUBCUTANEOUS
  Filled 2023-09-19: qty 22.5

## 2023-09-19 MED ORDER — DENOSUMAB 120 MG/1.7ML ~~LOC~~ SOLN
120.0000 mg | Freq: Once | SUBCUTANEOUS | Status: AC
Start: 2023-09-19 — End: 2023-09-19
  Administered 2023-09-19: 120 mg via SUBCUTANEOUS
  Filled 2023-09-19: qty 1.7

## 2023-09-19 NOTE — Progress Notes (Addendum)
 Hematology and Oncology Follow Up Visit  GAMBLE ENDERLE [id number] October 02, 1945 78 y.o. 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 daily-start on 09/14/2020 -- changed to 500 mg po q day on 12/11/2020 Xgeva  120 mg IM q. 3 months-next dose on 12/2023  Radiation therapy to the prostate bed -- completed on 09/21/2021     Interim History:  Mr. Tetrault is back for his follow-up.  So far, things are going quite well for him.  His wife actually comes in with him.  I have never had the chance to meeting her.  She is very very nice.  The had a wonderful time down in [place].  I think they are their back in May.  They enjoyed themselves.  He has done quite well.  He has had no problems with the Zytiga .   His last PSA was previous nondetectable.  His testosterone  was on the low side.  He sees does having some issues with the hot flashes.  He really hates the Lupron  shots.  They are quite aggravating for him.  As such, I will see if we might be able to get the Orgovyx oral medication for him.  I am not sure if insurance will cover this.  However, this would certainly make his life a whole lot better.  There has been no problems with bowels or bladder.  He has had good appetite.  He has had no rashes.  There is been no leg swelling.  Overall, I would have said that his performance status is ECOG 1.   Medications:  Current Outpatient Medications:    abiraterone  acetate (ZYTIGA ) 250 MG tablet, TAKE 2 TABLETS BY MOUTH DAILY ON AN EMPTY STOMACH 1 HOUR BEFORE  OR 2 HOURS AFTER A MEAL, Disp: 60 tablet, Rfl: 6   albuterol  (VENTOLIN  HFA) 108 (90 Base) MCG/ACT inhaler, Inhale 2 puffs into the lungs every 6 (six) hours as needed., Disp: 18 g, Rfl: 2   amLODipine  (NORVASC ) 10 MG tablet, Take 1 tablet (10 mg total) by mouth daily., Disp: 90 tablet, Rfl: 3   aspirin 81  MG EC tablet, Take 81 mg by mouth daily., Disp: , Rfl:    budesonide -formoterol  (SYMBICORT ) 160-4.5 MCG/ACT inhaler, Inhale 2 puffs into the lungs 2 (two) times daily., Disp: 1 each, Rfl: 12   cyclobenzaprine  (FLEXERIL ) 5 MG tablet, Take 1 tablet (5 mg total) by mouth 3 (three) times daily as needed for muscle spasms., Disp: 15 tablet, Rfl: 0   fexofenadine (ALLEGRA) 60 MG tablet, Take 60 mg by mouth daily., Disp: , Rfl:    ipratropium (ATROVENT) 0.06 % nasal spray, Place 1 spray into both nostrils 4 (four) times daily. (Patient taking differently: Place 1 spray into both nostrils 4 (four) times daily.), Disp: , Rfl:    losartan  (COZAAR ) 100 MG tablet, Take 1 tablet (100 mg total) by mouth daily., Disp: 90 tablet, Rfl: 3   Multiple Vitamin (MULTI-VITAMIN) tablet, Take 1 tablet by mouth daily., Disp: , Rfl:    naproxen (NAPROSYN) 500 MG tablet, Take 500 mg by mouth daily as needed for mild pain (pain score 1-3) or moderate pain (pain score 4-6)., Disp: , Rfl:    predniSONE  (DELTASONE ) 5 MG tablet, TAKE 1 TABLET(5 MG) BY MOUTH DAILY WITH BREAKFAST, Disp: 30 tablet, Rfl: 6   rosuvastatin  (CRESTOR ) 40 MG tablet, Take 1 tablet (40 mg  total) by mouth daily., Disp: 90 tablet, Rfl: 3   vitamin B-12 (CYANOCOBALAMIN) 500 MCG tablet, Take 500 mcg by mouth daily., Disp: , Rfl:    nitroGLYCERIN  (NITROSTAT ) 0.4 MG SL tablet, Place 1 tablet (0.4 mg total) under the tongue every 5 (five) minutes as needed for chest pain. (Patient not taking: Reported on 09/19/2023), Disp: 90 tablet, Rfl: 3  Allergies:  Allergies  Allergen Reactions   Metformin  And Related Diarrhea and Nausea And Vomiting   Tamsulosin Other (See Comments)    Past Medical History, Surgical history, Social history, and Family History were reviewed and updated.  Review of Systems: Review of Systems  Constitutional: Negative.   HENT:  Negative.    Eyes: Negative.   Respiratory: Negative.    Cardiovascular: Negative.   Gastrointestinal:  Negative.   Endocrine: Positive for hot flashes.  Genitourinary: Negative.    Musculoskeletal: Negative.   Skin: Negative.   Neurological: Negative.   Hematological: Negative.   Psychiatric/Behavioral: Negative.      Physical Exam:  Vital signs show temperature of 98.8.  Pulse 68.  Blood pressure 142/72.  Weight is 240 pounds.   Wt Readings from Last 3 Encounters:  09/19/23 240 lb 6.4 oz (109 kg)  09/10/23 242 lb (109.8 kg)  06/20/23 242 lb (109.8 kg)    Physical Exam Vitals reviewed.  HENT:     Head: Normocephalic and atraumatic.  Eyes:     Pupils: Pupils are equal, round, and reactive to light.  Cardiovascular:     Rate and Rhythm: Normal rate and regular rhythm.     Heart sounds: Normal heart sounds.  Pulmonary:     Effort: Pulmonary effort is normal.     Breath sounds: Normal breath sounds.  Abdominal:     General: Bowel sounds are normal.     Palpations: Abdomen is soft.  Musculoskeletal:        General: No tenderness or deformity. Normal range of motion.     Cervical back: Normal range of motion.  Lymphadenopathy:     Cervical: No cervical adenopathy.  Skin:    General: Skin is warm and dry.     Findings: No erythema or rash.  Neurological:     Mental Status: He is alert and oriented to person, place, and time.  Psychiatric:        Behavior: Behavior normal.        Thought Content: Thought content normal.        Judgment: Judgment normal.     Lab Results  Component Value Date   WBC 7.8 09/19/2023   HGB 12.5 (L) 09/19/2023   HCT 37.7 (L) 09/19/2023   MCV 97.4 09/19/2023   PLT 131 (L) 09/19/2023     Chemistry      Component Value Date/Time   NA 141 06/20/2023 0907   NA 141 03/31/2023 1142   K 3.8 06/20/2023 0907   CL 106 06/20/2023 0907   CO2 27 06/20/2023 0907   BUN 17 06/20/2023 0907   BUN 16 03/31/2023 1142   CREATININE 1.03 06/20/2023 0907      Component Value Date/Time   CALCIUM  9.3 06/20/2023 0907   ALKPHOS 47 06/20/2023 0907   AST  19 06/20/2023 0907   ALT 22 06/20/2023 0907   BILITOT 0.6 06/20/2023 0907      Impression and Plan: Mr. Toral is a very nice 78 year old African-American male.  He has castrate sensitive prostate cancer.  So far, everything is going quite well for him.  His PSA really has been undetectable.  He is tolerating the treatments well.  Overall, everything looks fantastic.  I am so happy for him.  I know that he will have a wonderful summer.  His wife is very nice.  She is very charming.  For right now, I do not see that we have to do any scans.  We will plan on seeing him back in another 3 months.   Maude JONELLE Crease, MD 7/18/20259:55 AM

## 2023-09-19 NOTE — Patient Instructions (Signed)
 Leuprolide Suspension for Injection (Prostate Cancer) What is this medication? LEUPROLIDE (loo PROE lide) reduces the symptoms of prostate cancer. It works by decreasing levels of the hormone testosterone in the body. This prevents prostate cancer cells from spreading or growing. This medicine may be used for other purposes; ask your health care provider or pharmacist if you have questions. COMMON BRAND NAME(S): Eligard, Lupron Depot, Lutrate Depot What should I tell my care team before I take this medication? They need to know if you have any of these conditions: Diabetes Heart disease Heart failure High or low levels of electrolytes, such as magnesium, potassium, or sodium in your blood Irregular heartbeat or rhythm Seizures An unusual or allergic reaction to leuprolide, other medications, foods, dyes, or preservatives Pregnant or trying to get pregnant Breast-feeding How should I use this medication? This medication is injected under the skin or into a muscle. It is given by your care team in a hospital or clinic setting. Talk to your care team about the use of this medication in children. Special care may be needed. Overdosage: If you think you have taken too much of this medicine contact a poison control center or emergency room at once. NOTE: This medicine is only for you. Do not share this medicine with others. What if I miss a dose? Keep appointments for follow-up doses. It is important not to miss your dose. Call your care team if you are unable to keep an appointment. What may interact with this medication? Do not take this medication with any of the following: Cisapride Dronedarone Ketoconazole Levoketoconazole Pimozide Thioridazine This medication may also interact with the following: Other medications that cause heart rhythm changes This list may not describe all possible interactions. Give your health care provider a list of all the medicines, herbs, non-prescription  drugs, or dietary supplements you use. Also tell them if you smoke, drink alcohol, or use illegal drugs. Some items may interact with your medicine. What should I watch for while using this medication? Visit your care team for regular checks on your progress. Tell your care team if your symptoms do not start to get better or if they get worse. This medication may increase blood sugar. The risk may be higher in patients who already have diabetes. Ask your care team what you can do to lower the risk of diabetes while taking this medication. This medication may cause infertility. Talk to your care team if you are concerned about your fertility. Heart attacks and strokes have been reported with the use of this medication. Get emergency help if you develop signs or symptoms of a heart attack or stroke. Talk to your care team about the risks and benefits of this medication. What side effects may I notice from receiving this medication? Side effects that you should report to your care team as soon as possible: Allergic reactions--skin rash, itching, hives, swelling of the face, lips, tongue, or throat Heart attack--pain or tightness in the chest, shoulders, arms, or jaw, nausea, shortness of breath, cold or clammy skin, feeling faint or lightheaded Heart rhythm changes--fast or irregular heartbeat, dizziness, feeling faint or lightheaded, chest pain, trouble breathing High blood sugar (hyperglycemia)--increased thirst or amount of urine, unusual weakness or fatigue, blurry vision New or worsening seizures Redness, blistering, peeling, or loosening of the skin, including inside the mouth Stroke--sudden numbness or weakness of the face, arm, or leg, trouble speaking, confusion, trouble walking, loss of balance or coordination, dizziness, severe headache, change in vision Swelling and pain  of the tumor site or lymph nodes Side effects that usually do not require medical attention (report these to your care  team if they continue or are bothersome): Change in sex drive or performance Hot flashes Joint pain Pain, redness, or irritation at injection site Swelling of the ankles, hands, or feet Unusual weakness or fatigue This list may not describe all possible side effects. Call your doctor for medical advice about side effects. You may report side effects to FDA at 1-800-FDA-1088. Where should I keep my medication? This medication is given in a hospital or clinic. It will not be stored at home. NOTE: This sheet is a summary. It may not cover all possible information. If you have questions about this medicine, talk to your doctor, pharmacist, or health care provider.  2024 Elsevier/Gold Standard (2023-01-31 00:00:00)Denosumab Injection (Oncology) What is this medication? DENOSUMAB (den oh SUE mab) prevents weakened bones caused by cancer. It may also be used to treat noncancerous bone tumors that cannot be removed by surgery. It can also be used to treat high calcium levels in the blood caused by cancer. It works by blocking a protein that causes bones to break down quickly. This slows down the release of calcium from bones, which lowers calcium levels in your blood. It also makes your bones stronger and less likely to break (fracture). This medicine may be used for other purposes; ask your health care provider or pharmacist if you have questions. COMMON BRAND NAME(S): XGEVA What should I tell my care team before I take this medication? They need to know if you have any of these conditions: Dental disease Having surgery or tooth extraction Infection Kidney disease Low levels of calcium or vitamin D in the blood Malnutrition On hemodialysis Skin conditions or sensitivity Thyroid or parathyroid disease An unusual reaction to denosumab, other medications, foods, dyes, or preservatives Pregnant or trying to get pregnant Breast-feeding How should I use this medication? This medication is for  injection under the skin. It is given by your care team in a hospital or clinic setting. A special MedGuide will be given to you before each treatment. Be sure to read this information carefully each time. Talk to your care team about the use of this medication in children. While it may be prescribed for children as young as 13 years for selected conditions, precautions do apply. Overdosage: If you think you have taken too much of this medicine contact a poison control center or emergency room at once. NOTE: This medicine is only for you. Do not share this medicine with others. What if I miss a dose? Keep appointments for follow-up doses. It is important not to miss your dose. Call your care team if you are unable to keep an appointment. What may interact with this medication? Do not take this medication with any of the following: Other medications containing denosumab This medication may also interact with the following: Medications that lower your chance of fighting infection Steroid medications, such as prednisone or cortisone This list may not describe all possible interactions. Give your health care provider a list of all the medicines, herbs, non-prescription drugs, or dietary supplements you use. Also tell them if you smoke, drink alcohol, or use illegal drugs. Some items may interact with your medicine. What should I watch for while using this medication? Your condition will be monitored carefully while you are receiving this medication. You may need blood work while taking this medication. This medication may increase your risk of getting an  infection. Call your care team for advice if you get a fever, chills, sore throat, or other symptoms of a cold or flu. Do not treat yourself. Try to avoid being around people who are sick. You should make sure you get enough calcium and vitamin D while you are taking this medication, unless your care team tells you not to. Discuss the foods you eat and  the vitamins you take with your care team. Some people who take this medication have severe bone, joint, or muscle pain. This medication may also increase your risk for jaw problems or a broken thigh bone. Tell your care team right away if you have severe pain in your jaw, bones, joints, or muscles. Tell your care team if you have any pain that does not go away or that gets worse. Talk to your care team if you may be pregnant. Serious birth defects can occur if you take this medication during pregnancy and for 5 months after the last dose. You will need a negative pregnancy test before starting this medication. Contraception is recommended while taking this medication and for 5 months after the last dose. Your care team can help you find the option that works for you. What side effects may I notice from receiving this medication? Side effects that you should report to your care team as soon as possible: Allergic reactions--skin rash, itching, hives, swelling of the face, lips, tongue, or throat Bone, joint, or muscle pain Low calcium level--muscle pain or cramps, confusion, tingling, or numbness in the hands or feet Osteonecrosis of the jaw--pain, swelling, or redness in the mouth, numbness of the jaw, poor healing after dental work, unusual discharge from the mouth, visible bones in the mouth Side effects that usually do not require medical attention (report to your care team if they continue or are bothersome): Cough Diarrhea Fatigue Headache Nausea This list may not describe all possible side effects. Call your doctor for medical advice about side effects. You may report side effects to FDA at 1-800-FDA-1088. Where should I keep my medication? This medication is given in a hospital or clinic. It will not be stored at home. NOTE: This sheet is a summary. It may not cover all possible information. If you have questions about this medicine, talk to your doctor, pharmacist, or health care  provider.  2024 Elsevier/Gold Standard (2021-07-11 00:00:00)

## 2023-09-19 NOTE — Addendum Note (Signed)
 Addended by: TIMMY COY R on: 09/19/2023 04:44 PM   Modules accepted: Orders

## 2023-09-20 LAB — PSA, TOTAL AND FREE
PSA, Free Pct: UNDETERMINED %
PSA, Free: <0.02 ng/mL
Prostate Specific Ag, Serum: <0.1 ng/mL (ref 0.0–4.0)

## 2023-09-20 LAB — TESTOSTERONE: Testosterone: <3 ng/dL — ABNORMAL LOW (ref 264–916)

## 2023-09-22 ENCOUNTER — Telehealth: Payer: Self-pay | Admitting: Pharmacy Technician

## 2023-09-22 ENCOUNTER — Encounter: Payer: Self-pay | Admitting: Hematology & Oncology

## 2023-09-22 ENCOUNTER — Telehealth: Payer: Self-pay | Admitting: Pharmacist

## 2023-09-22 ENCOUNTER — Other Ambulatory Visit (HOSPITAL_COMMUNITY): Payer: Self-pay

## 2023-09-22 DIAGNOSIS — C61 Malignant neoplasm of prostate: Secondary | ICD-10-CM

## 2023-09-22 MED ORDER — RELUGOLIX 120 MG PO TABS
ORAL_TABLET | ORAL | 0 refills | Status: DC
  Filled 2023-09-23: qty 30, 28d supply, fill #0

## 2023-09-22 NOTE — Telephone Encounter (Signed)
 Oral Oncology Patient Advocate Encounter  Prior Authorization for Orgovyx  has been approved.    PA# [id number] Effective dates: 10/02/2023 through 03/03/2024  Patients co-pay is $886.48.    Bryant Saye (Patty) Chet Burnet, CPhT  [facility], [place], [place], [place] Oral Chemotherapy Patient Advocate Phone: [phone]  Fax: [phone]

## 2023-09-22 NOTE — Telephone Encounter (Signed)
 Oral Oncology Patient Advocate Encounter   Received notification that prior authorization for Orgovyx  is required.   PA submitted on CMM on 09/22/2023 Key [id number] Status is pending     Tangee Marszalek (Patty) Chet Burnet, CPhT  [facility], [place], [place], [place] Oral Chemotherapy Patient Advocate Phone: [phone]  Fax: [phone]

## 2023-09-22 NOTE — Telephone Encounter (Signed)
 Oral Oncology Patient Advocate Encounter  Was successful in securing patient a grant from [employer] to provide copayment coverage for Orgovyx .  This will keep the out of pocket expense at $0.     The billing information is as follows and has been shared with [facility].    RxBin: [id number] PCN: [id number] Member ID: [id number] Group ID: [id number] Dates of Eligibility: 09/22/2023 through 10/22/2023  Fund:  Prostate Cancer Copay Assistance Program  [place] (Patty) Chet Burnet, CPhT  [facility], [place], [place], [place] Oral Chemotherapy Patient Advocate Phone: [phone]  Fax: [phone]

## 2023-09-22 NOTE — Telephone Encounter (Signed)
 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 Orgovyx  will take the place of patient's Lupron  injection.   CBC w/ Diff and CMP from 09/19/23 assessed, no relevant lab abnormalities requiring baseline dose adjustment required at this time. Prescription dose and frequency assessed for appropriateness. Patient will need loading dose prescription sent for Orgovyx  (360 mg by mouth on day 1 followed by 120 mg daily thereafter)   Current medication list in Epic reviewed, no relevant/significant DDIs with Orgovyx  identified.  Evaluated chart and no patient barriers to medication adherence noted.   Prescription has been e-scribed to the [facility] for benefits analysis and approval.  Oral Oncology Clinic will continue to follow for insurance authorization, copayment issues, initial counseling and start date.  Asberry Macintosh, PharmD, BCPS, BCOP Hematology/Oncology Clinical Pharmacist [phone] 09/22/2023 8:45 AM

## 2023-09-22 NOTE — Telephone Encounter (Signed)
 Oral Oncology Patient Advocate Encounter  Randall Carter was conditionally approved.   The [employer] representative I spoke with on the phone stated that they will be faxing over a form that states what the additional requirements for the patient are.  Triana Coover (Patty) Chet Burnet, CPhT  [facility], [place], [place], [place] Oral Chemotherapy Patient Advocate Phone: [phone]  Fax: [phone]

## 2023-09-23 ENCOUNTER — Other Ambulatory Visit (HOSPITAL_COMMUNITY): Payer: Self-pay

## 2023-09-23 ENCOUNTER — Other Ambulatory Visit: Payer: Self-pay

## 2023-09-23 ENCOUNTER — Other Ambulatory Visit: Payer: Self-pay | Admitting: Pharmacy Technician

## 2023-09-23 NOTE — Progress Notes (Signed)
 Specialty Pharmacy Initial Fill Coordination Note  HAI GRABE is a 78 y.o. male contacted today regarding refills of specialty medication(s) Relugolix  (ORGOVYX ) .  Patient requested Delivery  on 09/25/23  to verified address [address] [place]  [address]   Medication will be filled on 07/23.   Patient is aware of $0 copayment. Leisure centre manager.  Please do not send refill until patient has confirmed he has started therapy. He is scheduled to start on 09/01 but we want to get grant billed before then since it is conditionally approved.  Dontarious Schaum (Patty) Chet Burnet, CPhT  [facility], [place], [place], [place] Oral Chemotherapy Patient Advocate Phone: [phone]  Fax: [phone]

## 2023-09-23 NOTE — Telephone Encounter (Signed)
 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 Orgovyx  will take the place of patient's Lupron  injection.   Counseled patient on administration, dosing, side effects, monitoring, drug-food interactions, safe handling, storage, and disposal.  Patient will take Orgovyx  120 mg tablet, take 3 tablets (360 mg total) by mouth on day 1, then take 1 tablet (120 mg total) daily thereafter.  Patient educated if he holds the Orgovyx  for more than 7 days, when he resumes he will need to take the 360 mg loading dose, and then resume at 120 mg daily.   Start date: 11/03/23  Side effects include but not limited to: hot flashes, fatigue, muscle aches, increased blood sugar, increase in triglycerides (patient had lipid panel in 03/2023).    Reviewed with patient importance of keeping a medication schedule and plan for any missed doses.  After discussion with patient no patient barriers to medication adherence identified.   [name] voiced understanding and appreciation. All questions answered. Medication handout provided.  Distress thermometer not completed during telephone call as patient has been on previous lines of therapy.   Provided patient with Oral Chemotherapy Navigation Clinic phone number. Patient knows to call the office with questions or concerns.  Randall Carter, PharmD, BCPS, BCOP Hematology/Oncology Clinical Pharmacist [phone] 09/23/2023 11:10 AM

## 2023-09-23 NOTE — Progress Notes (Signed)
 Oral Chemotherapy Pharmacist Encounter  Patient was counseled under telephone encounter from 09/22/23.   Patient will start first dose of Orgovyx  on 11/03/23.  Asberry Macintosh, PharmD, BCPS, BCOP Hematology/Oncology Clinical Pharmacist [facility] Oral Chemotherapy Navigation Clinics [phone] 09/23/2023 11:19 AM

## 2023-09-24 ENCOUNTER — Other Ambulatory Visit: Payer: Self-pay

## 2023-09-30 ENCOUNTER — Encounter: Payer: Self-pay | Admitting: Family Medicine

## 2023-09-30 ENCOUNTER — Telehealth (INDEPENDENT_AMBULATORY_CARE_PROVIDER_SITE_OTHER): Admitting: Family Medicine

## 2023-09-30 ENCOUNTER — Encounter: Payer: Self-pay | Admitting: Medical

## 2023-09-30 VITALS — BP 135/82 | Ht 70.0 in | Wt 240.0 lb

## 2023-09-30 DIAGNOSIS — J0140 Acute pansinusitis, unspecified: Secondary | ICD-10-CM | POA: Diagnosis not present

## 2023-09-30 MED ORDER — BENZONATATE 100 MG PO CAPS: 100.0000 mg | ORAL_CAPSULE | Freq: Two times a day (BID) | ORAL | 0 refills | Status: DC | PRN

## 2023-09-30 MED ORDER — AMOXICILLIN-POT CLAVULANATE 875-125 MG PO TABS: 1.0000 | ORAL_TABLET | Freq: Two times a day (BID) | ORAL | 0 refills | Status: AC

## 2023-09-30 NOTE — Progress Notes (Signed)
 Started Friday -  Sinus pressure, runny nose Sore/scratchy throat Cough - worse at night (Chills on Friday but gone now)  Has not checked temp  Taking: mucinex/dayquil  No home testing (covid, etc)

## 2023-09-30 NOTE — Progress Notes (Signed)
 Virtual Video Visit via MyChart Note  I connected with  [name] on 09/30/23 at 12:40 PM EDT by the video enabled telemedicine application for MyChart, and verified that I am speaking with the correct person using two identifiers.   I introduced myself as a Publishing rights manager with the practice. We discussed the limitations of evaluation and management by telemedicine and the availability of in person appointments. The patient expressed understanding and agreed to proceed.  Participating parties in this visit include: The patient and the nurse practitioner listed.  The patient is: At home I am: In the office -  Primary Care at [facility]  Subjective:    CC:  Chief Complaint  Patient presents with   Sinus Problem    HPI: Randall Carter is a 78 y.o. year old male presenting today via MyChart today for sinus infection.   Discussed the use of AI scribe software for clinical note transcription with the patient, who gave verbal consent to proceed.  History of Present Illness Randall Carter is a 78 year old male with history of prostate cancer who presents with symptoms of a severe cold and sinus congestion.  Symptoms began last Thursday or Friday with a scratchy throat and chills, during which he felt unable to get warm. After a couple of days, these symptoms subsided, but he developed significant sinus congestion, particularly at night, which made breathing through his nose difficult and required him to sit up. He also experienced a productive cough with heavy mucus, described as white and yellow in color.  He has been using over-the-counter medications such as Mucinex and DayQuil, which provided some relief. Albuterol  is used to alleviate wheezing at night. He reports headaches off and on. No chest pain or trouble breathing from his chest.  He mentions that no cancer has been detected for almost a year - he is taking Lupron  injections along with Zytiga  . He also takes  a daily probiotic.        Past medical history, Surgical history, Family history not pertinant except as noted below, Social history, Allergies, and medications have been entered into the medical record, reviewed, and corrections made.   Review of Systems:  All review of systems negative except what is listed in the HPI   Objective:    General:  Speaking clearly in complete sentences. Absent shortness of breath noted.   Alert and oriented x3.   Normal judgment.  Absent acute distress.   Impression and Recommendations:    Problem List Items Addressed This Visit   None Visit Diagnoses       Acute non-recurrent pansinusitis    -  Primary   Relevant Medications   benzonatate  (TESSALON ) 100 MG capsule   amoxicillin -clavulanate (AUGMENTIN ) 875-125 MG tablet       Assessment & Plan Acute bacterial sinusitis with productive cough Condition worsened after initial improvement, indicating bacterial infection. Increased susceptibility due to current meds. - Prescribed Augmentin  twice daily with food and water. - Advised probiotic intake at least two hours apart from antibiotic. - Recommended Flonase and saline nasal spray for congestion. - Prescribed Tessalon  Perles for cough. - Advised continued use of Mucinex. - Encouraged increased fluid intake. - Instructed to monitor symptoms and return if no improvement after antibiotic course.          Follow-up if symptoms worsen or fail to improve.    I discussed the assessment and treatment plan with the patient. The patient was provided an opportunity  to ask questions and all were answered. The patient agreed with the plan and demonstrated an understanding of the instructions.   The patient was advised to call back or seek an in-person evaluation if the symptoms worsen or if the condition fails to improve as anticipated.   Waddell KATHEE Mon, NP

## 2023-10-15 ENCOUNTER — Other Ambulatory Visit: Payer: Self-pay

## 2023-10-15 ENCOUNTER — Other Ambulatory Visit: Payer: Self-pay | Admitting: Hematology & Oncology

## 2023-10-15 ENCOUNTER — Other Ambulatory Visit (HOSPITAL_COMMUNITY): Payer: Self-pay

## 2023-10-15 ENCOUNTER — Telehealth: Payer: Self-pay | Admitting: Pharmacist

## 2023-10-15 DIAGNOSIS — C61 Malignant neoplasm of prostate: Secondary | ICD-10-CM

## 2023-10-15 DIAGNOSIS — C7951 Secondary malignant neoplasm of bone: Secondary | ICD-10-CM

## 2023-10-15 MED ORDER — ORGOVYX 120 MG PO TABS
ORAL_TABLET | ORAL | 0 refills | Status: DC
Start: 2023-10-15 — End: 2023-10-15
  Filled 2023-10-15 (×2): qty 30, fill #0

## 2023-10-15 MED ORDER — RELUGOLIX 120 MG PO TABS
120.0000 mg | ORAL_TABLET | Freq: Every day | ORAL | 0 refills | Status: DC
  Filled 2023-10-15 – 2023-11-24 (×3): qty 30, 30d supply, fill #0

## 2023-10-15 NOTE — Telephone Encounter (Signed)
 Oral Oncology Pharmacist Encounter  Updated Orgovyx  refill sent in on 10/15/23 to 120 mg daily dosing as this is what patient will be on as maintenance (patient only needs 1 time loading dose of 360 mg for first dose, unless his Orgovyx  is stopped for more than 7 days.   Orgovyx  prescription will be profiled at pharmacy for patients next fill that is due Late September/beginning of October. Patient is scheduled to start Orgovyx  11/03/23.  Asberry Macintosh, PharmD, BCPS, BCOP Hematology/Oncology Clinical Pharmacist [phone] 10/15/2023 1:05 PM

## 2023-10-19 ENCOUNTER — Other Ambulatory Visit: Payer: Self-pay | Admitting: Cardiology

## 2023-10-19 DIAGNOSIS — I10 Essential (primary) hypertension: Secondary | ICD-10-CM

## 2023-10-28 ENCOUNTER — Other Ambulatory Visit (HOSPITAL_COMMUNITY): Payer: Self-pay

## 2023-10-28 ENCOUNTER — Telehealth: Payer: Self-pay

## 2023-10-28 ENCOUNTER — Encounter: Payer: Self-pay | Admitting: Hematology & Oncology

## 2023-10-28 NOTE — Telephone Encounter (Signed)
 Oral Oncology Patient Advocate Encounter  Was successful in securing patient a grant from [employer] to provide copayment coverage for Orgovyx .  This will keep the out of pocket expense at $0.    I have spoken with the patient.   The billing information is as follows and has been shared with [facility].    RxBin: [id number] PCN: AS Member ID: [id number] Group ID: [id number] Dates of Eligibility: 03/05/23 through 03/03/24  Fund:  Prostate Cancer   Charlott Hamilton,  CPhT-Adv  she/her/hers [facility]  [facility] Specialty Pharmacy Services Pharmacy Technician Patient Advocate Specialist III [facility] Phone: [phone]  Fax: [phone] [email or web address]

## 2023-10-29 ENCOUNTER — Other Ambulatory Visit (HOSPITAL_COMMUNITY): Payer: Self-pay

## 2023-10-29 ENCOUNTER — Other Ambulatory Visit: Payer: Self-pay | Admitting: Cardiology

## 2023-10-29 DIAGNOSIS — I10 Essential (primary) hypertension: Secondary | ICD-10-CM

## 2023-11-10 ENCOUNTER — Other Ambulatory Visit (HOSPITAL_COMMUNITY): Payer: Self-pay

## 2023-11-10 ENCOUNTER — Other Ambulatory Visit: Payer: Self-pay

## 2023-11-24 ENCOUNTER — Encounter (INDEPENDENT_AMBULATORY_CARE_PROVIDER_SITE_OTHER): Payer: Self-pay

## 2023-11-24 ENCOUNTER — Other Ambulatory Visit: Payer: Self-pay

## 2023-11-24 ENCOUNTER — Other Ambulatory Visit (HOSPITAL_COMMUNITY): Payer: Self-pay

## 2023-11-24 NOTE — Progress Notes (Signed)
 Specialty Pharmacy Refill Coordination Note  Randall Carter is a 78 y.o. male contacted today regarding refills of specialty medication(s) Relugolix  (ORGOVYX )   Patient requested (Patient-Rptd) Delivery   Delivery date: 11/26/23   Verified address: (Patient-Rptd) [address] [place], [place]. [address]   Medication will be filled on 11/25/23.

## 2023-12-19 ENCOUNTER — Other Ambulatory Visit: Payer: Self-pay | Admitting: Medical

## 2023-12-19 ENCOUNTER — Inpatient Hospital Stay: Admitting: Family

## 2023-12-19 ENCOUNTER — Inpatient Hospital Stay

## 2023-12-19 ENCOUNTER — Inpatient Hospital Stay: Attending: Hematology & Oncology

## 2023-12-19 ENCOUNTER — Encounter: Payer: Self-pay | Admitting: Family

## 2023-12-19 VITALS — BP 144/80 | HR 77 | Temp 99.2°F | Resp 20 | Ht 70.0 in | Wt 240.4 lb

## 2023-12-19 DIAGNOSIS — E895 Postprocedural testicular hypofunction: Secondary | ICD-10-CM | POA: Insufficient documentation

## 2023-12-19 DIAGNOSIS — Z79818 Long term (current) use of other agents affecting estrogen receptors and estrogen levels: Secondary | ICD-10-CM | POA: Diagnosis not present

## 2023-12-19 DIAGNOSIS — Z79899 Other long term (current) drug therapy: Secondary | ICD-10-CM | POA: Diagnosis not present

## 2023-12-19 DIAGNOSIS — J0140 Acute pansinusitis, unspecified: Secondary | ICD-10-CM

## 2023-12-19 DIAGNOSIS — C61 Malignant neoplasm of prostate: Secondary | ICD-10-CM | POA: Insufficient documentation

## 2023-12-19 DIAGNOSIS — Z5111 Encounter for antineoplastic chemotherapy: Secondary | ICD-10-CM | POA: Diagnosis present

## 2023-12-19 DIAGNOSIS — J0100 Acute maxillary sinusitis, unspecified: Secondary | ICD-10-CM

## 2023-12-19 DIAGNOSIS — C7800 Secondary malignant neoplasm of unspecified lung: Secondary | ICD-10-CM

## 2023-12-19 DIAGNOSIS — C7951 Secondary malignant neoplasm of bone: Secondary | ICD-10-CM | POA: Insufficient documentation

## 2023-12-19 DIAGNOSIS — R062 Wheezing: Secondary | ICD-10-CM

## 2023-12-19 LAB — CBC WITH DIFFERENTIAL (CANCER CENTER ONLY)
Abs Immature Granulocytes: 0.03 K/uL (ref 0.00–0.07)
Basophils Absolute: 0 K/uL (ref 0.0–0.1)
Basophils Relative: 0 %
Eosinophils Absolute: 0.1 K/uL (ref 0.0–0.5)
Eosinophils Relative: 2 %
HCT: 37.3 % — ABNORMAL LOW (ref 39.0–52.0)
Hemoglobin: 12.2 g/dL — ABNORMAL LOW (ref 13.0–17.0)
Immature Granulocytes: 0 %
Lymphocytes Relative: 28 %
Lymphs Abs: 2.3 K/uL (ref 0.7–4.0)
MCH: 31.6 pg (ref 26.0–34.0)
MCHC: 32.7 g/dL (ref 30.0–36.0)
MCV: 96.6 fL (ref 80.0–100.0)
Monocytes Absolute: 0.8 K/uL (ref 0.1–1.0)
Monocytes Relative: 10 %
Neutro Abs: 4.9 K/uL (ref 1.7–7.7)
Neutrophils Relative %: 60 %
Platelet Count: 152 K/uL (ref 150–400)
RBC: 3.86 MIL/uL — ABNORMAL LOW (ref 4.22–5.81)
RDW: 12.7 % (ref 11.5–15.5)
WBC Count: 8.2 K/uL (ref 4.0–10.5)
nRBC: 0 % (ref 0.0–0.2)

## 2023-12-19 LAB — CMP (CANCER CENTER ONLY)
ALT: 20 U/L (ref 0–44)
AST: 21 U/L (ref 15–41)
Albumin: 4.3 g/dL (ref 3.5–5.0)
Alkaline Phosphatase: 57 U/L (ref 38–126)
Anion gap: 12 (ref 5–15)
BUN: 17 mg/dL (ref 8–23)
CO2: 22 mmol/L (ref 22–32)
Calcium: 8.7 mg/dL — ABNORMAL LOW (ref 8.9–10.3)
Chloride: 108 mmol/L (ref 98–111)
Creatinine: 0.96 mg/dL (ref 0.61–1.24)
GFR, Estimated: >60 mL/min (ref >60)
Glucose, Bld: 124 mg/dL — ABNORMAL HIGH (ref 70–99)
Potassium: 3.7 mmol/L (ref 3.5–5.1)
Sodium: 142 mmol/L (ref 135–145)
Total Bilirubin: 0.5 mg/dL (ref 0.0–1.2)
Total Protein: 7.3 g/dL (ref 6.5–8.1)

## 2023-12-19 MED ORDER — AZITHROMYCIN 250 MG PO TABS: ORAL_TABLET | ORAL | 0 refills | Status: DC

## 2023-12-19 MED ORDER — BENZONATATE 100 MG PO CAPS: 100.0000 mg | ORAL_CAPSULE | Freq: Two times a day (BID) | ORAL | 1 refills | Status: AC | PRN

## 2023-12-19 MED ORDER — DENOSUMAB 120 MG/1.7ML ~~LOC~~ SOLN
120.0000 mg | Freq: Once | SUBCUTANEOUS | Status: AC
  Administered 2023-12-19: 120 mg via SUBCUTANEOUS
  Filled 2023-12-19: qty 1.7

## 2023-12-19 NOTE — Progress Notes (Signed)
 BP remains elevated, is taking Nyquil for cold, instructed to monitor BP at home and notify PCP if it remains over  140/90. Verbalized understanding.

## 2023-12-19 NOTE — Patient Instructions (Signed)
 Denosumab  Injection (Oncology) What is this medication? DENOSUMAB  (den oh SUE mab) prevents weakened bones caused by cancer. It may also be used to treat noncancerous bone tumors that cannot be removed by surgery. It can also be used to treat high calcium  levels in the blood caused by cancer. It works by blocking a protein that causes bones to break down quickly. This slows down the release of calcium  from bones, which lowers calcium  levels in your blood. It also makes your bones stronger and less likely to break (fracture). This medicine may be used for other purposes; ask your health care provider or pharmacist if you have questions. COMMON BRAND NAME(S): XGEVA  What should I tell my care team before I take this medication? They need to know if you have any of these conditions: Dental disease Having surgery or tooth extraction Infection Kidney disease Low levels of calcium  or vitamin D  in the blood Malnutrition On hemodialysis Skin conditions or sensitivity Thyroid  or parathyroid disease An unusual reaction to denosumab , other medications, foods, dyes, or preservatives Pregnant or trying to get pregnant Breast-feeding How should I use this medication? This medication is for injection under the skin. It is given by your care team in a hospital or clinic setting. A special MedGuide will be given to you before each treatment. Be sure to read this information carefully each time. Talk to your care team about the use of this medication in children. While it may be prescribed for children as young as 13 years for selected conditions, precautions do apply. Overdosage: If you think you have taken too much of this medicine contact a poison control center or emergency room at once. NOTE: This medicine is only for you. Do not share this medicine with others. What if I miss a dose? Keep appointments for follow-up doses. It is important not to miss your dose. Call your care team if you are unable to  keep an appointment. What may interact with this medication? Do not take this medication with any of the following: Other medications containing denosumab  This medication may also interact with the following: Medications that lower your chance of fighting infection Steroid medications, such as prednisone  or cortisone This list may not describe all possible interactions. Give your health care provider a list of all the medicines, herbs, non-prescription drugs, or dietary supplements you use. Also tell them if you smoke, drink alcohol, or use illegal drugs. Some items may interact with your medicine. What should I watch for while using this medication? Your condition will be monitored carefully while you are receiving this medication. You may need blood work while taking this medication. This medication may increase your risk of getting an infection. Call your care team for advice if you get a fever, chills, sore throat, or other symptoms of a cold or flu. Do not treat yourself. Try to avoid being around people who are sick. You should make sure you get enough calcium  and vitamin D  while you are taking this medication, unless your care team tells you not to. Discuss the foods you eat and the vitamins you take with your care team. Some people who take this medication have severe bone, joint, or muscle pain. This medication may also increase your risk for jaw problems or a broken thigh bone. Tell your care team right away if you have severe pain in your jaw, bones, joints, or muscles. Tell your care team if you have any pain that does not go away or that gets worse. Talk  to your care team if you may be pregnant. Serious birth defects can occur if you take this medication during pregnancy and for 5 months after the last dose. You will need a negative pregnancy test before starting this medication. Contraception is recommended while taking this medication and for 5 months after the last dose. Your care team  can help you find the option that works for you. What side effects may I notice from receiving this medication? Side effects that you should report to your care team as soon as possible: Allergic reactions--skin rash, itching, hives, swelling of the face, lips, tongue, or throat Bone, joint, or muscle pain Low calcium  level--muscle pain or cramps, confusion, tingling, or numbness in the hands or feet Osteonecrosis of the jaw--pain, swelling, or redness in the mouth, numbness of the jaw, poor healing after dental work, unusual discharge from the mouth, visible bones in the mouth Side effects that usually do not require medical attention (report to your care team if they continue or are bothersome): Cough Diarrhea Fatigue Headache Nausea This list may not describe all possible side effects. Call your doctor for medical advice about side effects. You may report side effects to FDA at 1-800-FDA-1088. Where should I keep my medication? This medication is given in a hospital or clinic. It will not be stored at home. NOTE: This sheet is a summary. It may not cover all possible information. If you have questions about this medicine, talk to your doctor, pharmacist, or health care provider.  2024 Elsevier/Gold Standard (2021-07-11 00:00:00)

## 2023-12-19 NOTE — Progress Notes (Signed)
 Per Dr. Timmy ok to give xgeva  with calcium  of 8.7

## 2023-12-19 NOTE — Progress Notes (Signed)
 Hematology and Oncology Follow Up Visit  Randall Carter [id number] 1945/11/11 78 y.o. 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 daily-start on 09/14/2020 -- changed to 500 mg po q day on 12/11/2020 Xgeva  120 mg IM q. 3 months-next dose on 03/2024 Radiation therapy to the prostate bed -- completed on 09/21/2021   Interim History:  Randall Carter is here today with his wife for follow-up. He is doing well overall but has had sinus congestions with post nasal drip and cough with thick white phlegm. Lung sounds at this time are clear throughout.  No fever, chills, n/v, rash, dizziness, SOB, chest pain, palpitations, abdominal pain or changes in bowel or bladder habits.  He is doing well on both Zytiga  as well as Orgovyx  which he was able to start a month ago.  No swelling, tenderness, numbness or tingling in his extremities.  No falls or syncope reported. Appetite and hydration are good. Weight is stable at 240 lbs.   ECOG Performance Status: 1 - Symptomatic but completely ambulatory  Medications:  Allergies as of 12/19/2023       Reactions   Metformin  And Related Diarrhea, Nausea And Vomiting   Tamsulosin Other (See Comments)        Medication List        Accurate as of December 19, 2023  9:38 AM. If you have any questions, ask your nurse or doctor.          abiraterone  acetate 250 MG tablet Commonly known as: ZYTIGA  TAKE 2 TABLETS BY MOUTH DAILY ON AN EMPTY STOMACH 1 HOUR BEFORE  OR 2 HOURS AFTER A MEAL   albuterol  108 (90 Base) MCG/ACT inhaler Commonly known as: VENTOLIN  HFA Inhale 2 puffs into the lungs every 6 (six) hours as needed.   amLODipine  10 MG tablet Commonly known as: NORVASC  TAKE 1 TABLET(10 MG) BY MOUTH DAILY   aspirin EC 81 MG tablet Take 81 mg by mouth daily.   benzonatate  100 MG capsule Commonly known as: TESSALON  Take 1 capsule (100 mg  total) by mouth 2 (two) times daily as needed for cough.   budesonide -formoterol  160-4.5 MCG/ACT inhaler Commonly known as: Symbicort  Inhale 2 puffs into the lungs 2 (two) times daily.   cyanocobalamin 500 MCG tablet Commonly known as: VITAMIN B12 Take 500 mcg by mouth daily.   cyclobenzaprine  5 MG tablet Commonly known as: FLEXERIL  Take 1 tablet (5 mg total) by mouth 3 (three) times daily as needed for muscle spasms.   fexofenadine 60 MG tablet Commonly known as: ALLEGRA Take 60 mg by mouth daily.   ipratropium 0.06 % nasal spray Commonly known as: ATROVENT Place 1 spray into both nostrils 4 (four) times daily.   losartan  100 MG tablet Commonly known as: COZAAR  TAKE 1 TABLET(100 MG) BY MOUTH DAILY   Multi-Vitamin tablet Take 1 tablet by mouth daily.   naproxen 500 MG tablet Commonly known as: NAPROSYN Take 500 mg by mouth daily as needed for mild pain (pain score 1-3) or moderate pain (pain score 4-6).   nitroGLYCERIN  0.4 MG SL tablet Commonly known as: NITROSTAT  Place 1 tablet (0.4 mg total) under the tongue every 5 (five) minutes as needed for chest pain.   Orgovyx  120 MG tablet Generic drug: relugolix  Take 1 tablet (120 mg total) by mouth daily.   predniSONE  5 MG tablet Commonly known as: DELTASONE  TAKE  1 TABLET(5 MG) BY MOUTH DAILY WITH BREAKFAST   rosuvastatin  40 MG tablet Commonly known as: CRESTOR  Take 1 tablet (40 mg total) by mouth daily.        Allergies:  Allergies  Allergen Reactions   Metformin  And Related Diarrhea and Nausea And Vomiting   Tamsulosin Other (See Comments)    Past Medical History, Surgical history, Social history, and Family History were reviewed and updated.  Review of Systems: All other 10 point review of systems is negative.   Physical Exam:  height is 5' 10 (1.778 m) and weight is 240 lb 6.4 oz (109 kg). His oral temperature is 99.2 F (37.3 C). His blood pressure is 144/80 (abnormal) and his pulse is 77. His  respiration is 20 and oxygen saturation is 100%.   Wt Readings from Last 3 Encounters:  12/19/23 240 lb 6.4 oz (109 kg)  09/30/23 240 lb (108.9 kg)  09/19/23 240 lb 6.4 oz (109 kg)    Ocular: Sclerae unicteric, pupils equal, round and reactive to light Ear-nose-throat: Oropharynx clear, dentition fair Lymphatic: No cervical or supraclavicular adenopathy Lungs no rales or rhonchi, good excursion bilaterally Heart regular rate and rhythm, no murmur appreciated Abd soft, nontender, positive bowel sounds MSK no focal spinal tenderness, no joint edema Neuro: non-focal, well-oriented, appropriate affect Breasts: Deferred   Lab Results  Component Value Date   WBC 8.2 12/19/2023   HGB 12.2 (L) 12/19/2023   HCT 37.3 (L) 12/19/2023   MCV 96.6 12/19/2023   PLT 152 12/19/2023   No results found for: FERRITIN, IRON, TIBC, UIBC, IRONPCTSAT Lab Results  Component Value Date   RBC 3.86 (L) 12/19/2023   No results found for: KPAFRELGTCHN, LAMBDASER, KAPLAMBRATIO No results found for: IGGSERUM, IGA, IGMSERUM No results found for: STEPHANY CARLOTA BENSON MARKEL EARLA JOANNIE DOC VICK, SPEI   Chemistry      Component Value Date/Time   NA 140 09/19/2023 0907   NA 141 03/31/2023 1142   K 4.0 09/19/2023 0907   CL 107 09/19/2023 0907   CO2 22 09/19/2023 0907   BUN 22 09/19/2023 0907   BUN 16 03/31/2023 1142   CREATININE 1.01 09/19/2023 0907      Component Value Date/Time   CALCIUM  9.5 09/19/2023 0907   ALKPHOS 50 09/19/2023 0907   AST 18 09/19/2023 0907   ALT 17 09/19/2023 0907   BILITOT 0.8 09/19/2023 0907       Impression and Plan: Randall Carter is a very nice 78 year old African American gentleman with castrate sensitive prostate cancer. So far her continues to do well.  PSA and testosterone  are pending.  He will continue his same regimen with Zytiga  and Orgovyx .  We will proceed with Xgeva  today per MD. Cherryl ok with corrected  calcium  of 8.5.  We will get him on a zpack for acute sinusitis and refilled tessalon  perles.  Follow-up in 3 months.   Lauraine Pepper, NP 10/17/20259:38 AM

## 2023-12-20 ENCOUNTER — Ambulatory Visit: Payer: Self-pay | Admitting: Hematology & Oncology

## 2023-12-20 LAB — PSA, TOTAL AND FREE
PSA, Free Pct: UNDETERMINED %
PSA, Free: <0.02 ng/mL
Prostate Specific Ag, Serum: <0.1 ng/mL (ref 0.0–4.0)

## 2023-12-20 LAB — TESTOSTERONE: Testosterone: <3 ng/dL — ABNORMAL LOW (ref 264–916)

## 2023-12-21 ENCOUNTER — Other Ambulatory Visit: Payer: Self-pay | Admitting: Medical

## 2023-12-21 DIAGNOSIS — R062 Wheezing: Secondary | ICD-10-CM

## 2023-12-22 ENCOUNTER — Encounter: Payer: Self-pay | Admitting: Hematology & Oncology

## 2023-12-22 ENCOUNTER — Other Ambulatory Visit (HOSPITAL_COMMUNITY): Payer: Self-pay

## 2023-12-22 ENCOUNTER — Other Ambulatory Visit: Payer: Self-pay | Admitting: Hematology & Oncology

## 2023-12-22 ENCOUNTER — Other Ambulatory Visit: Payer: Self-pay

## 2023-12-22 DIAGNOSIS — C61 Malignant neoplasm of prostate: Secondary | ICD-10-CM

## 2023-12-22 MED ORDER — ORGOVYX 120 MG PO TABS
120.0000 mg | ORAL_TABLET | Freq: Every day | ORAL | 0 refills | Status: DC
  Filled 2023-12-22 – 2023-12-26 (×3): qty 30, 30d supply, fill #0

## 2023-12-22 NOTE — Telephone Encounter (Signed)
 Advised via MyChart.

## 2023-12-22 NOTE — Telephone Encounter (Signed)
-----   Message from Maude JONELLE Crease sent at 12/20/2023  6:40 AM EDT ----- Please call and let him know that the PSA is still not detectable.  Jeralyn ----- Message ----- From: Rebecka, Lab In Index Sent: 12/19/2023   9:19 AM EDT To: Maude JONELLE Crease, MD

## 2023-12-24 ENCOUNTER — Other Ambulatory Visit (HOSPITAL_COMMUNITY): Payer: Self-pay

## 2023-12-25 ENCOUNTER — Other Ambulatory Visit: Payer: Self-pay | Admitting: Cardiology

## 2023-12-25 DIAGNOSIS — E7849 Other hyperlipidemia: Secondary | ICD-10-CM

## 2023-12-25 DIAGNOSIS — I2510 Atherosclerotic heart disease of native coronary artery without angina pectoris: Secondary | ICD-10-CM

## 2023-12-26 ENCOUNTER — Other Ambulatory Visit: Payer: Self-pay

## 2023-12-26 ENCOUNTER — Other Ambulatory Visit (HOSPITAL_COMMUNITY): Payer: Self-pay

## 2023-12-26 NOTE — Progress Notes (Signed)
 Specialty Pharmacy Refill Coordination Note  Randall Carter is a 78 y.o. male contacted today regarding refills of specialty medication(s) Relugolix  (Orgovyx )   Patient requested Delivery   Delivery date: 12/31/23   Verified address: [address] [place], [place]. [address]   Medication will be filled on 12/30/23.

## 2023-12-30 ENCOUNTER — Other Ambulatory Visit: Payer: Self-pay

## 2023-12-31 NOTE — Progress Notes (Signed)
 Randall Carter                                          MRN: [id number]   12/31/2023   The VBCI Quality Team Specialist reviewed this patient medical record for the purposes of chart review for care gap closure. The following were reviewed: chart review for care gap closure-kidney health evaluation for diabetes:eGFR  and uACR.    VBCI Quality Team

## 2024-01-21 ENCOUNTER — Other Ambulatory Visit (HOSPITAL_COMMUNITY): Payer: Self-pay

## 2024-01-22 ENCOUNTER — Other Ambulatory Visit: Payer: Self-pay

## 2024-01-22 ENCOUNTER — Other Ambulatory Visit: Payer: Self-pay | Admitting: Hematology & Oncology

## 2024-01-22 DIAGNOSIS — C61 Malignant neoplasm of prostate: Secondary | ICD-10-CM

## 2024-01-22 MED ORDER — ORGOVYX 120 MG PO TABS
120.0000 mg | ORAL_TABLET | Freq: Every day | ORAL | 0 refills | Status: DC
  Filled 2024-01-22 – 2024-01-30 (×4): qty 30, 30d supply, fill #0

## 2024-01-26 ENCOUNTER — Other Ambulatory Visit: Payer: Self-pay

## 2024-01-28 ENCOUNTER — Other Ambulatory Visit: Payer: Self-pay

## 2024-01-30 ENCOUNTER — Other Ambulatory Visit: Payer: Self-pay

## 2024-01-30 ENCOUNTER — Other Ambulatory Visit (HOSPITAL_COMMUNITY): Payer: Self-pay

## 2024-01-30 NOTE — Progress Notes (Signed)
 Specialty Pharmacy Refill Coordination Note  Randall Carter is a 78 y.o. male contacted today regarding refills of specialty medication(s) Relugolix  (Orgovyx )   Patient requested Delivery   Delivery date: 02/02/24   Verified address: [address] [place], [place]. [address]   Medication will be filled on: 01/30/24

## 2024-01-30 NOTE — Progress Notes (Signed)
 Specialty Pharmacy Ongoing Clinical Assessment Note  Randall Carter is a 78 y.o. male who is being followed by the specialty pharmacy service for RxSp Oncology   Patient's specialty medication(s) reviewed today: Relugolix  (Orgovyx )   Missed doses in the last 4 weeks: 0   Patient/Caregiver did not have any additional questions or concerns.   Therapeutic benefit summary: Unable to assess   Adverse events/side effects summary: Experienced adverse events/side effects   Patient's therapy is appropriate to: Continue    Goals Addressed             This Visit's Progress    Slow Disease Progression       Patient is unable to be assessed as therapy was recently initiated. Patient will maintain adherence          Follow up: 3 months  Talesha Ellithorpe M Cheo Selvey Specialty Pharmacist

## 2024-01-31 ENCOUNTER — Other Ambulatory Visit: Payer: Self-pay | Admitting: Cardiology

## 2024-01-31 DIAGNOSIS — I10 Essential (primary) hypertension: Secondary | ICD-10-CM

## 2024-02-03 ENCOUNTER — Encounter: Payer: Self-pay | Admitting: Cardiology

## 2024-02-03 DIAGNOSIS — I10 Essential (primary) hypertension: Secondary | ICD-10-CM

## 2024-02-03 MED ORDER — AMLODIPINE BESYLATE 10 MG PO TABS: 10.0000 mg | ORAL_TABLET | Freq: Every day | ORAL | Status: AC

## 2024-02-04 ENCOUNTER — Other Ambulatory Visit: Payer: Self-pay | Admitting: Hematology & Oncology

## 2024-02-04 DIAGNOSIS — C61 Malignant neoplasm of prostate: Secondary | ICD-10-CM

## 2024-02-19 ENCOUNTER — Other Ambulatory Visit: Payer: Self-pay

## 2024-02-19 ENCOUNTER — Other Ambulatory Visit: Payer: Self-pay | Admitting: Hematology & Oncology

## 2024-02-19 DIAGNOSIS — C61 Malignant neoplasm of prostate: Secondary | ICD-10-CM

## 2024-02-19 MED ORDER — ORGOVYX 120 MG PO TABS
120.0000 mg | ORAL_TABLET | Freq: Every day | ORAL | 0 refills | Status: DC
  Filled 2024-02-20 – 2024-02-23 (×3): qty 30, 30d supply, fill #0

## 2024-02-20 ENCOUNTER — Other Ambulatory Visit: Payer: Self-pay

## 2024-02-23 ENCOUNTER — Other Ambulatory Visit (HOSPITAL_COMMUNITY): Payer: Self-pay

## 2024-02-23 ENCOUNTER — Other Ambulatory Visit: Payer: Self-pay

## 2024-02-24 ENCOUNTER — Other Ambulatory Visit: Payer: Self-pay

## 2024-02-24 NOTE — Progress Notes (Signed)
 Specialty Pharmacy Refill Coordination Note  Randall Carter is a 78 y.o. male contacted today regarding refills of specialty medication(s) Relugolix  (Orgovyx )   Patient requested Delivery   Delivery date: 03/01/24   Verified address: [address] [place] [phone]   Medication will be filled on: 02/27/24

## 2024-02-27 ENCOUNTER — Other Ambulatory Visit: Payer: Self-pay

## 2024-03-09 ENCOUNTER — Other Ambulatory Visit (HOSPITAL_COMMUNITY): Payer: Self-pay

## 2024-03-15 ENCOUNTER — Other Ambulatory Visit (HOSPITAL_COMMUNITY): Payer: Self-pay

## 2024-03-15 ENCOUNTER — Encounter: Payer: Self-pay | Admitting: Hematology & Oncology

## 2024-03-15 ENCOUNTER — Telehealth: Payer: Self-pay

## 2024-03-15 NOTE — Telephone Encounter (Signed)
 Oral Oncology Patient Advocate Encounter   Was successful in securing patient a $6000 grant from [employer] ([employer]) to provide copayment coverage for Zytiga .  This will keep the out of pocket expense at $0.     The billing information is as follows and has been shared with [facility].   RxBin: [id number] PCN:  [id number] Member ID: [id number] Group ID: [id number] Dates of Eligibility: 09/11/2023 through 03/10/2024   Charlott Hamilton,  CPhT-Adv  she/her/hers [place]  [facility] Pharmacy Technician Patient Advocate Specialist III [facility] Phone: [phone]  Fax: [phone] [email or web address]

## 2024-03-18 ENCOUNTER — Other Ambulatory Visit: Payer: Self-pay

## 2024-03-19 ENCOUNTER — Inpatient Hospital Stay: Attending: Hematology & Oncology

## 2024-03-19 ENCOUNTER — Other Ambulatory Visit: Payer: Self-pay

## 2024-03-19 ENCOUNTER — Other Ambulatory Visit: Payer: Self-pay | Admitting: Hematology & Oncology

## 2024-03-19 ENCOUNTER — Inpatient Hospital Stay

## 2024-03-19 ENCOUNTER — Encounter: Payer: Self-pay | Admitting: Hematology & Oncology

## 2024-03-19 ENCOUNTER — Inpatient Hospital Stay: Admitting: Hematology & Oncology

## 2024-03-19 VITALS — BP 131/75 | HR 68 | Temp 99.1°F | Resp 20 | Ht 70.0 in | Wt 240.0 lb

## 2024-03-19 DIAGNOSIS — C61 Malignant neoplasm of prostate: Secondary | ICD-10-CM

## 2024-03-19 DIAGNOSIS — Z5111 Encounter for antineoplastic chemotherapy: Secondary | ICD-10-CM | POA: Diagnosis present

## 2024-03-19 DIAGNOSIS — Z79899 Other long term (current) drug therapy: Secondary | ICD-10-CM | POA: Insufficient documentation

## 2024-03-19 DIAGNOSIS — E895 Postprocedural testicular hypofunction: Secondary | ICD-10-CM | POA: Insufficient documentation

## 2024-03-19 DIAGNOSIS — C7951 Secondary malignant neoplasm of bone: Secondary | ICD-10-CM

## 2024-03-19 LAB — CBC WITH DIFFERENTIAL (CANCER CENTER ONLY)
Abs Immature Granulocytes: 0.02 K/uL (ref 0.00–0.07)
Basophils Absolute: 0 K/uL (ref 0.0–0.1)
Basophils Relative: 0 %
Eosinophils Absolute: 0.1 K/uL (ref 0.0–0.5)
Eosinophils Relative: 1 %
HCT: 36.3 % — ABNORMAL LOW (ref 39.0–52.0)
Hemoglobin: 12.3 g/dL — ABNORMAL LOW (ref 13.0–17.0)
Immature Granulocytes: 0 %
Lymphocytes Relative: 33 %
Lymphs Abs: 3 K/uL (ref 0.7–4.0)
MCH: 32.4 pg (ref 26.0–34.0)
MCHC: 33.9 g/dL (ref 30.0–36.0)
MCV: 95.5 fL (ref 80.0–100.0)
Monocytes Absolute: 0.8 K/uL (ref 0.1–1.0)
Monocytes Relative: 9 %
Neutro Abs: 5.2 K/uL (ref 1.7–7.7)
Neutrophils Relative %: 57 %
Platelet Count: 158 K/uL (ref 150–400)
RBC: 3.8 MIL/uL — ABNORMAL LOW (ref 4.22–5.81)
RDW: 13 % (ref 11.5–15.5)
WBC Count: 9.1 K/uL (ref 4.0–10.5)
nRBC: 0 % (ref 0.0–0.2)

## 2024-03-19 LAB — CMP (CANCER CENTER ONLY)
ALT: 33 U/L (ref 0–44)
AST: 27 U/L (ref 15–41)
Albumin: 4.3 g/dL (ref 3.5–5.0)
Alkaline Phosphatase: 60 U/L (ref 38–126)
Anion gap: 11 (ref 5–15)
BUN: 22 mg/dL (ref 8–23)
CO2: 21 mmol/L — ABNORMAL LOW (ref 22–32)
Calcium: 9.6 mg/dL (ref 8.9–10.3)
Chloride: 107 mmol/L (ref 98–111)
Creatinine: 1.11 mg/dL (ref 0.61–1.24)
GFR, Estimated: >60 mL/min
Glucose, Bld: 154 mg/dL — ABNORMAL HIGH (ref 70–99)
Potassium: 3.9 mmol/L (ref 3.5–5.1)
Sodium: 139 mmol/L (ref 135–145)
Total Bilirubin: 0.6 mg/dL (ref 0.0–1.2)
Total Protein: 7.5 g/dL (ref 6.5–8.1)

## 2024-03-19 MED ORDER — ORGOVYX 120 MG PO TABS
120.0000 mg | ORAL_TABLET | Freq: Every day | ORAL | 0 refills | Status: AC
  Filled 2024-03-19 – 2024-03-30 (×3): qty 30, 30d supply, fill #0

## 2024-03-19 MED ORDER — DENOSUMAB 120 MG/1.7ML ~~LOC~~ SOLN
120.0000 mg | Freq: Once | SUBCUTANEOUS | Status: AC
  Administered 2024-03-19: 120 mg via SUBCUTANEOUS
  Filled 2024-03-19: qty 1.7

## 2024-03-19 NOTE — Progress Notes (Signed)
 " Hematology and Oncology Follow Up Visit  Randall Carter [id number] Jun 28, 1945 79 y.o. 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 daily-start on 09/14/2020 -- changed to 500 mg po q day on 12/11/2020 Xgeva  120 mg IM q. 3 months-next dose on 06/2024  Radiation therapy to the prostate bed -- completed on 09/21/2021     Interim History:  Randall Carter is back for his follow-up.  He now is on Orgovyx .  He is doing well with this.  I was happy we get him on the Orgovyx .  Is making life all that easier for him.  He had no problems over the Holiday season.  He still has some hot flashes.  When we last saw him in October, his PSA was less than 0.1.  He has had no cough or shortness of breath.  He has had no nausea or vomiting.  Is been no change in bowel or bladder habits.   His last testosterone  level was less than 3.  His wife comes in with him.  She is very very nice.  I enjoyed talking with her.  Overall, he has had no problems with the Zytiga .  Currently, his performance status is ECOG 0.   Medications:  Current Outpatient Medications:    abiraterone  acetate (ZYTIGA ) 250 MG tablet, TAKE 2 TABLETS BY MOUTH DAILY ON AN EMPTY STOMACH 1 HOUR BEFORE  OR 2 HOURS AFTER A MEAL, Disp: 60 tablet, Rfl: 6   albuterol  (VENTOLIN  HFA) 108 (90 Base) MCG/ACT inhaler, Inhale 2 puffs into the lungs every 6 (six) hours as needed., Disp: 18 g, Rfl: 2   amLODipine  (NORVASC ) 10 MG tablet, Take 1 tablet (10 mg total) by mouth daily., Disp: 30 tablet, Rfl: 03   aspirin 81 MG EC tablet, Take 81 mg by mouth daily., Disp: , Rfl:    benzonatate  (TESSALON ) 100 MG capsule, Take 1 capsule (100 mg total) by mouth 2 (two) times daily as needed for cough., Disp: 20 capsule, Rfl: 1   budesonide -formoterol  (SYMBICORT ) 160-4.5 MCG/ACT inhaler, INHALE 2 PUFFS INTO THE LUNGS TWICE DAILY, Disp: 10.2 g, Rfl:  0   cyclobenzaprine  (FLEXERIL ) 5 MG tablet, Take 1 tablet (5 mg total) by mouth 3 (three) times daily as needed for muscle spasms., Disp: 15 tablet, Rfl: 0   loratadine (CLARITIN) 5 MG/5ML syrup, Take by mouth daily., Disp: , Rfl:    losartan  (COZAAR ) 100 MG tablet, TAKE 1 TABLET(100 MG) BY MOUTH DAILY, Disp: 90 tablet, Rfl: 3   Multiple Vitamin (MULTI-VITAMIN) tablet, Take 1 tablet by mouth daily., Disp: , Rfl:    predniSONE  (DELTASONE ) 5 MG tablet, TAKE 1 TABLET(5 MG) BY MOUTH DAILY WITH BREAKFAST, Disp: 30 tablet, Rfl: 6   relugolix  (ORGOVYX ) 120 MG tablet, Take 1 tablet (120 mg total) by mouth daily., Disp: 30 tablet, Rfl: 0   rosuvastatin  (CRESTOR ) 40 MG tablet, TAKE 1 TABLET(40 MG) BY MOUTH DAILY., Disp: 90 tablet, Rfl: 0   vitamin B-12 (CYANOCOBALAMIN) 500 MCG tablet, Take 500 mcg by mouth daily., Disp: , Rfl:    ipratropium (ATROVENT) 0.06 % nasal spray, Place 1 spray into both nostrils 4 (four) times daily. (Patient not taking: Reported on 03/19/2024), Disp: , Rfl:    naproxen (NAPROSYN) 500 MG tablet, Take 500 mg by mouth daily as needed for mild pain (pain score 1-3) or moderate pain (pain score 4-6). (Patient not  taking: Reported on 03/19/2024), Disp: , Rfl:   Allergies:  Allergies  Allergen Reactions   Metformin  And Related Diarrhea and Nausea And Vomiting   Tamsulosin Other (See Comments)    Past Medical History, Surgical history, Social history, and Family History were reviewed and updated.  Review of Systems: Review of Systems  Constitutional: Negative.   HENT:  Negative.    Eyes: Negative.   Respiratory: Negative.    Cardiovascular: Negative.   Gastrointestinal: Negative.   Endocrine: Positive for hot flashes.  Genitourinary: Negative.    Musculoskeletal: Negative.   Skin: Negative.   Neurological: Negative.   Hematological: Negative.   Psychiatric/Behavioral: Negative.      Physical Exam:  Vital signs show temperature of 99.1.  Pulse 68.  Blood pressure 131/75.   Weight is 240 pounds.      Wt Readings from Last 3 Encounters:  03/19/24 240 lb (108.9 kg)  12/19/23 240 lb 6.4 oz (109 kg)  09/30/23 240 lb (108.9 kg)    Physical Exam Vitals reviewed.  HENT:     Head: Normocephalic and atraumatic.  Eyes:     Pupils: Pupils are equal, round, and reactive to light.  Cardiovascular:     Rate and Rhythm: Normal rate and regular rhythm.     Heart sounds: Normal heart sounds.  Pulmonary:     Effort: Pulmonary effort is normal.     Breath sounds: Normal breath sounds.  Abdominal:     General: Bowel sounds are normal.     Palpations: Abdomen is soft.  Musculoskeletal:        General: No tenderness or deformity. Normal range of motion.     Cervical back: Normal range of motion.  Lymphadenopathy:     Cervical: No cervical adenopathy.  Skin:    General: Skin is warm and dry.     Findings: No erythema or rash.  Neurological:     Mental Status: He is alert and oriented to person, place, and time.  Psychiatric:        Behavior: Behavior normal.        Thought Content: Thought content normal.        Judgment: Judgment normal.     Lab Results  Component Value Date   WBC 9.1 03/19/2024   HGB 12.3 (L) 03/19/2024   HCT 36.3 (L) 03/19/2024   MCV 95.5 03/19/2024   PLT 158 03/19/2024     Chemistry      Component Value Date/Time   NA 139 03/19/2024 0901   NA 141 03/31/2023 1142   K 3.9 03/19/2024 0901   CL 107 03/19/2024 0901   CO2 21 (L) 03/19/2024 0901   BUN 22 03/19/2024 0901   BUN 16 03/31/2023 1142   CREATININE 1.11 03/19/2024 0901      Component Value Date/Time   CALCIUM  9.6 03/19/2024 0901   ALKPHOS 60 03/19/2024 0901   AST 27 03/19/2024 0901   ALT 33 03/19/2024 0901   BILITOT 0.6 03/19/2024 0901      Impression and Plan: Randall Carter is a very nice 79 year old African-American male.  He has castrate sensitive prostate cancer.  So far, everything is going quite well for him.  We have him on the Orgovyx  which really is a  plus.  We will see what his PSA is.  He will get his Xgeva  today.  I still do not see need for any scans right now.  If we start to see his PSA going up, then we will rescan him.  We will get him back in 3 months.     Randall JONELLE Crease, MD 1/16/202610:30 AM  "

## 2024-03-19 NOTE — Patient Instructions (Signed)
 Denosumab  Injection (Oncology) What is this medication? DENOSUMAB  (den oh SUE mab) prevents weakened bones caused by cancer. It may also be used to treat noncancerous bone tumors that cannot be removed by surgery. It can also be used to treat high calcium  levels in the blood caused by cancer. It works by blocking a protein that causes bones to break down quickly. This slows down the release of calcium  from bones, which lowers calcium  levels in your blood. It also makes your bones stronger and less likely to break (fracture). This medicine may be used for other purposes; ask your health care provider or pharmacist if you have questions. COMMON BRAND NAME(S): XGEVA  What should I tell my care team before I take this medication? They need to know if you have any of these conditions: Dental disease Having surgery or tooth extraction Infection Kidney disease Low levels of calcium  or vitamin D  in the blood Malnutrition On hemodialysis Skin conditions or sensitivity Thyroid  or parathyroid disease An unusual reaction to denosumab , other medications, foods, dyes, or preservatives Pregnant or trying to get pregnant Breast-feeding How should I use this medication? This medication is for injection under the skin. It is given by your care team in a hospital or clinic setting. A special MedGuide will be given to you before each treatment. Be sure to read this information carefully each time. Talk to your care team about the use of this medication in children. While it may be prescribed for children as young as 13 years for selected conditions, precautions do apply. Overdosage: If you think you have taken too much of this medicine contact a poison control center or emergency room at once. NOTE: This medicine is only for you. Do not share this medicine with others. What if I miss a dose? Keep appointments for follow-up doses. It is important not to miss your dose. Call your care team if you are unable to  keep an appointment. What may interact with this medication? Do not take this medication with any of the following: Other medications containing denosumab  This medication may also interact with the following: Medications that lower your chance of fighting infection Steroid medications, such as prednisone  or cortisone This list may not describe all possible interactions. Give your health care provider a list of all the medicines, herbs, non-prescription drugs, or dietary supplements you use. Also tell them if you smoke, drink alcohol, or use illegal drugs. Some items may interact with your medicine. What should I watch for while using this medication? Your condition will be monitored carefully while you are receiving this medication. You may need blood work while taking this medication. This medication may increase your risk of getting an infection. Call your care team for advice if you get a fever, chills, sore throat, or other symptoms of a cold or flu. Do not treat yourself. Try to avoid being around people who are sick. You should make sure you get enough calcium  and vitamin D  while you are taking this medication, unless your care team tells you not to. Discuss the foods you eat and the vitamins you take with your care team. Some people who take this medication have severe bone, joint, or muscle pain. This medication may also increase your risk for jaw problems or a broken thigh bone. Tell your care team right away if you have severe pain in your jaw, bones, joints, or muscles. Tell your care team if you have any pain that does not go away or that gets worse. Talk  to your care team if you may be pregnant. Serious birth defects can occur if you take this medication during pregnancy and for 5 months after the last dose. You will need a negative pregnancy test before starting this medication. Contraception is recommended while taking this medication and for 5 months after the last dose. Your care team  can help you find the option that works for you. What side effects may I notice from receiving this medication? Side effects that you should report to your care team as soon as possible: Allergic reactions--skin rash, itching, hives, swelling of the face, lips, tongue, or throat Bone, joint, or muscle pain Low calcium  level--muscle pain or cramps, confusion, tingling, or numbness in the hands or feet Osteonecrosis of the jaw--pain, swelling, or redness in the mouth, numbness of the jaw, poor healing after dental work, unusual discharge from the mouth, visible bones in the mouth Side effects that usually do not require medical attention (report to your care team if they continue or are bothersome): Cough Diarrhea Fatigue Headache Nausea This list may not describe all possible side effects. Call your doctor for medical advice about side effects. You may report side effects to FDA at 1-800-FDA-1088. Where should I keep my medication? This medication is given in a hospital or clinic. It will not be stored at home. NOTE: This sheet is a summary. It may not cover all possible information. If you have questions about this medicine, talk to your doctor, pharmacist, or health care provider.  2024 Elsevier/Gold Standard (2021-07-11 00:00:00)

## 2024-03-20 LAB — PSA, TOTAL AND FREE
PSA, Free Pct: UNDETERMINED %
PSA, Free: <0.02 ng/mL
Prostate Specific Ag, Serum: <0.1 ng/mL (ref 0.0–4.0)

## 2024-03-20 LAB — TESTOSTERONE: Testosterone: <3 ng/dL — ABNORMAL LOW (ref 264–916)

## 2024-03-22 ENCOUNTER — Other Ambulatory Visit: Payer: Self-pay

## 2024-03-24 ENCOUNTER — Other Ambulatory Visit: Payer: Self-pay

## 2024-03-24 ENCOUNTER — Ambulatory Visit: Payer: Self-pay | Admitting: Hematology & Oncology

## 2024-03-26 ENCOUNTER — Other Ambulatory Visit: Payer: Self-pay

## 2024-03-30 ENCOUNTER — Other Ambulatory Visit: Payer: Self-pay

## 2024-03-30 ENCOUNTER — Other Ambulatory Visit (HOSPITAL_COMMUNITY): Payer: Self-pay

## 2024-03-30 NOTE — Progress Notes (Signed)
 Specialty Pharmacy Refill Coordination Note  Randall Carter is a 79 y.o. male contacted today regarding refills of specialty medication(s) Relugolix  (Orgovyx )   Patient requested Delivery   Delivery date: 04/01/24   Verified address: [address] [place] [phone]   Medication will be filled on: 03/30/24  Notified patient we will try to have medication delivered tomorrow but may be delayed to 1.29.26 due to weather.

## 2024-03-31 ENCOUNTER — Other Ambulatory Visit (HOSPITAL_COMMUNITY): Payer: Self-pay

## 2024-04-01 ENCOUNTER — Ambulatory Visit: Admitting: Cardiology

## 2024-04-01 ENCOUNTER — Encounter: Payer: Self-pay | Admitting: Cardiology

## 2024-04-01 VITALS — BP 138/78 | HR 71 | Ht 70.6 in | Wt 243.0 lb

## 2024-04-01 DIAGNOSIS — Z923 Personal history of irradiation: Secondary | ICD-10-CM

## 2024-04-01 DIAGNOSIS — I10 Essential (primary) hypertension: Secondary | ICD-10-CM | POA: Diagnosis not present

## 2024-04-01 DIAGNOSIS — E088 Diabetes mellitus due to underlying condition with unspecified complications: Secondary | ICD-10-CM | POA: Diagnosis not present

## 2024-04-01 DIAGNOSIS — I2510 Atherosclerotic heart disease of native coronary artery without angina pectoris: Secondary | ICD-10-CM

## 2024-04-01 DIAGNOSIS — E7849 Other hyperlipidemia: Secondary | ICD-10-CM

## 2024-04-01 NOTE — Patient Instructions (Addendum)
" ° ° °   ° °  ° °  Medication Instructions:  Your physician recommends that you continue on your current medications as directed. Please refer to the Current Medication list given to you today.  *If you need a refill on your cardiac medications before your next appointment, please call your pharmacy*   Lab Work: Your physician recommends that you return for lab work in: the next few days for CMP and lipids. You need to have labs done when you are fasting. [facility] lab is located on the 3rd floor, [address]. Hours are Monday - Thursday 8 am to 4 pm, closed 11:30 am to 1:00 pm. You do NOT need an appointment.   Or  [facility] [address] [place], [place] [address]  If you have labs (blood work) drawn today and your tests are completely normal, you will receive your results only by: MyChart Message (if you have MyChart) OR A paper copy in the mail If you have any lab test that is abnormal or we need to change your treatment, we will call you to review the results.   Testing/Procedures: None ordered   Follow-Up: At [facility], you and your health needs are our priority.  As part of our continuing mission to provide you with exceptional heart care, we have created designated Provider Care Teams.  These Care Teams include your primary Cardiologist (physician) and Advanced Practice Providers (APPs -  Physician Assistants and Nurse Practitioners) who all work together to provide you with the care you need, when you need it.  We recommend signing up for the patient portal called MyChart.  Sign up information is provided on this After Visit Summary.  MyChart is used to connect with patients for Virtual Visits (Telemedicine).  Patients are able to view lab/test results, encounter notes, upcoming appointments, etc.  Non-urgent messages can be sent to your provider as well.   To learn more about what you can do with MyChart, go to [email or web address].    Your next appointment:   9  month(s)  The format for your next appointment:   In Person  Provider:   Jennifer Crape, MD    Other Instructions none  Important Information About Sugar       "

## 2024-04-01 NOTE — Progress Notes (Signed)
 " Cardiology Office Note:    Date:  04/01/2024   ID:  Randall Carter, DOB October 09, 1945, MRN [id number]  PCP:  Dorina Loving, PA-C  Cardiologist:  Jennifer JONELLE Crape, MD   Referring MD: Dorina Loving, PA-C    ASSESSMENT:    1. Coronary artery disease involving native coronary artery of native heart without angina pectoris   2. Essential hypertension   3. Diabetes mellitus due to underlying condition with unspecified complications (HCC)   4. Familial hyperlipidemia   5. History of radiation therapy    PLAN:    In order of problems listed above:  Coronary artery disease: Secondary prevention stressed with the patient.  Importance of compliance with diet medication stressed and patient verbalized standing. I congratulated him about his excellent secondary prevention practices. Essential hypertension: Blood pressure is stable and diet was emphasized.  Lifestyle modification urged. Mixed dyslipidemia: On lipid-lowering medications see back in the next few days for complete blood workup lipids goal LDL less than 60. Diabetes mellitus: Managed by primary care.  Diet emphasized. Obesity: Weight reduction stressed diet emphasized and risks of obesity discussed and he promises to do better. Patient will be seen in follow-up appointment in 6 months or earlier if the patient has any concerns.    Medication Adjustments/Labs and Tests Ordered: Current medicines are reviewed at length with the patient today.  Concerns regarding medicines are outlined above.  Orders Placed This Encounter  Procedures   EKG 12-Lead   No orders of the defined types were placed in this encounter.    No chief complaint on file.    History of Present Illness:    Randall Carter is a 79 y.o. male.  Patient has past medical history of coronary artery disease, essential hypertension, mixed dyslipidemia and diabetes mellitus.  He denies any problems at this time and takes care of activities of daily living.  No  chest pain orthopnea or PND.  He walks more than 30 minutes a day without any symptoms and is very regular about this.  At the time of my evaluation, the patient is alert awake oriented and in no distress.  Past Medical History:  Diagnosis Date   Allergic rhinitis 04/05/2015   CAD (coronary artery disease) 01/21/2023   Diabetes mellitus due to underlying condition with unspecified complications (HCC) 01/09/2023   Elevated PSA, greater than or equal to 20 ng/ml 03/26/2020   Essential hypertension 01/09/2023   Familial hyperlipidemia 01/09/2023   Goals of care, counseling/discussion 08/30/2020   Grief 11/02/2019   History of radiation therapy 05/03/2022   Hyperlipidemia associated with type 2 diabetes mellitus (HCC) 04/05/2015   Hypertension associated with type 2 diabetes mellitus (HCC) 04/05/2015   Mild intermittent asthma without complication 04/05/2015   Obesity (BMI 30.0-34.9) 01/09/2023   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   Prostate cancer (HCC) 01/09/2023   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   Prostate cancer metastatic to multiple sites [facility]) 07/27/2020   Receiving treatment at [facility] in [place]     Thrombocytopenia 03/26/2020   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) 04/05/2015   Vitamin B12 deficiency 03/26/2020   Vitamin D insufficiency 03/26/2020    Past Surgical History:  Procedure Laterality Date   APPENDECTOMY      Current Medications: Active Medications[1]   Allergies:   Metformin  and related and Tamsulosin   Social History   Socioeconomic History   Marital status: Married  Spouse name: Not on file   Number of children: Not on file   Years of education: Not on file   Highest education level: Associate degree: occupational, scientist, product/process development, or vocational program  Occupational History   Not on file  Tobacco Use   Smoking status: Never     Passive exposure: Never   Smokeless tobacco: Never  Vaping Use   Vaping status: Never Used  Substance and Sexual Activity   Alcohol use: Not Currently   Drug use: Not Currently   Sexual activity: Not Currently  Other Topics Concern   Not on file  Social History Narrative   Not on file   Social Drivers of Health   Tobacco Use: Low Risk (04/01/2024)   Patient History    Smoking Tobacco Use: Never    Smokeless Tobacco Use: Never    Passive Exposure: Never  Financial Resource Strain: Low Risk (09/07/2023)   Overall Financial Resource Strain (CARDIA)    Difficulty of Paying Living Expenses: Not hard at all  Food Insecurity: No Food Insecurity (09/07/2023)   Epic    Worried About Radiation Protection Practitioner of Food in the Last Year: Never true    Ran Out of Food in the Last Year: Never true  Transportation Needs: No Transportation Needs (09/07/2023)   Epic    Lack of Transportation (Medical): No    Lack of Transportation (Non-Medical): No  Physical Activity: Sufficiently Active (09/07/2023)   Exercise Vital Sign    Days of Exercise per Week: 4 days    Minutes of Exercise per Session: 40 min  Stress: No Stress Concern Present (09/07/2023)   [employer] of Occupational Health - Occupational Stress Questionnaire    Feeling of Stress: Not at all  Social Connections: Socially Integrated (09/07/2023)   Social Connection and Isolation Panel    Frequency of Communication with Friends and Family: Twice a week    Frequency of Social Gatherings with Friends and Family: Once a week    Attends Religious Services: 1 to 4 times per year    Active Member of Clubs or Organizations: Yes    Attends Banker Meetings: More than 4 times per year    Marital Status: Married  Depression (PHQ2-9): Low Risk (03/19/2024)   Depression (PHQ2-9)    PHQ-2 Score: 0  Alcohol Screen: Low Risk (09/07/2023)   Alcohol Screen    Last Alcohol Screening Score (AUDIT): 0  Housing: Low Risk (09/07/2023)   Epic    Unable  to Pay for Housing in the Last Year: No    Number of Times Moved in the Last Year: 0    Homeless in the Last Year: No  Utilities: Not At Risk (09/10/2023)   Epic    Threatened with loss of utilities: No  Health Literacy: Adequate Health Literacy (09/10/2023)   B1300 Health Literacy    Frequency of need for help with medical instructions: Never     Family History: The patient's family history includes Diabetes in his mother; Hypertension in his father. There is no history of Heart disease or Cancer.  ROS:   Please see the history of present illness.    All other systems reviewed and are negative.  EKGs/Labs/Other Studies Reviewed:    The following studies were reviewed today: .SABRAEKG Interpretation Date/Time:  Thursday April 01 2024 15:01:17 EST Ventricular Rate:  71 PR Interval:  154 QRS Duration:  116 QT Interval:  404 QTC Calculation: 439 R Axis:   -1  Text Interpretation: Normal sinus rhythm  Left ventricular hypertrophy with QRS widening ( R in aVL , Cornell product ) When compared with ECG of 12-Feb-2023 16:12, T wave inversion now evident in Inferior leads Confirmed by Edwyna Backers [phone]) on 04/01/2024 3:24:19 PM     Recent Labs: 03/19/2024: ALT 33; BUN 22; Creatinine 1.11; Hemoglobin 12.3; Platelet Count 158; Potassium 3.9; Sodium 139  Recent Lipid Panel    Component Value Date/Time   CHOL 155 03/31/2023 1142   TRIG 84 03/31/2023 1142   HDL 52 03/31/2023 1142   CHOLHDL 3.0 03/31/2023 1142   CHOLHDL 7 12/18/2022 0910   VLDL 25.4 12/18/2022 0910   LDLCALC 87 03/31/2023 1142    Physical Exam:    VS:  BP (!) 142/74   Pulse 71   Ht 5' 10.6 (1.793 m)   Wt 243 lb 0.6 oz (110.2 kg)   SpO2 97%   BMI 34.28 kg/m     Wt Readings from Last 3 Encounters:  04/01/24 243 lb 0.6 oz (110.2 kg)  03/19/24 240 lb (108.9 kg)  12/19/23 240 lb 6.4 oz (109 kg)     GEN: Patient is in no acute distress HEENT: Normal NECK: No JVD; No carotid bruits LYMPHATICS: No  lymphadenopathy CARDIAC: Hear sounds regular, 2/6 systolic murmur at the apex. RESPIRATORY:  Clear to auscultation without rales, wheezing or rhonchi  ABDOMEN: Soft, non-tender, non-distended MUSCULOSKELETAL:  No edema; No deformity  SKIN: Warm and dry NEUROLOGIC:  Alert and oriented x 3 PSYCHIATRIC:  Normal affect   Signed, Backers JONELLE Edwyna, MD  04/01/2024 3:25 PM    [facility]     [1]  Current Meds  Medication Sig   abiraterone  acetate (ZYTIGA ) 250 MG tablet TAKE 2 TABLETS BY MOUTH DAILY ON AN EMPTY STOMACH 1 HOUR BEFORE  OR 2 HOURS AFTER A MEAL   albuterol  (VENTOLIN  HFA) 108 (90 Base) MCG/ACT inhaler Inhale 2 puffs into the lungs every 6 (six) hours as needed.   amLODipine  (NORVASC ) 10 MG tablet Take 1 tablet (10 mg total) by mouth daily.   aspirin 81 MG EC tablet Take 81 mg by mouth daily.   benzonatate  (TESSALON ) 100 MG capsule Take 1 capsule (100 mg total) by mouth 2 (two) times daily as needed for cough.   budesonide -formoterol  (SYMBICORT ) 160-4.5 MCG/ACT inhaler INHALE 2 PUFFS INTO THE LUNGS TWICE DAILY   cyclobenzaprine  (FLEXERIL ) 5 MG tablet Take 1 tablet (5 mg total) by mouth 3 (three) times daily as needed for muscle spasms.   ipratropium (ATROVENT) 0.06 % nasal spray Place 1 spray into both nostrils 4 (four) times daily.   loratadine (CLARITIN) 5 MG/5ML syrup Take by mouth daily.   losartan  (COZAAR ) 100 MG tablet TAKE 1 TABLET(100 MG) BY MOUTH DAILY   Multiple Vitamin (MULTI-VITAMIN) tablet Take 1 tablet by mouth daily.   naproxen (NAPROSYN) 500 MG tablet Take 500 mg by mouth daily as needed for mild pain (pain score 1-3) or moderate pain (pain score 4-6).   predniSONE  (DELTASONE ) 5 MG tablet TAKE 1 TABLET(5 MG) BY MOUTH DAILY WITH BREAKFAST   relugolix  (ORGOVYX ) 120 MG tablet Take 1 tablet (120 mg total) by mouth daily.   rosuvastatin  (CRESTOR ) 40 MG tablet TAKE 1 TABLET(40 MG) BY MOUTH DAILY.   vitamin B-12 (CYANOCOBALAMIN) 500 MCG tablet Take  500 mcg by mouth daily.   "

## 2024-04-02 ENCOUNTER — Other Ambulatory Visit: Payer: Self-pay | Admitting: Hematology & Oncology

## 2024-04-02 DIAGNOSIS — C61 Malignant neoplasm of prostate: Secondary | ICD-10-CM

## 2024-04-03 LAB — LIPID PANEL
Chol/HDL Ratio: 3 ratio (ref 0.0–5.0)
Cholesterol, Total: 145 mg/dL (ref 100–199)
HDL: 49 mg/dL
LDL Chol Calc (NIH): 81 mg/dL (ref 0–99)
Triglycerides: 77 mg/dL (ref 0–149)
VLDL Cholesterol Cal: 15 mg/dL (ref 5–40)

## 2024-04-03 LAB — COMPREHENSIVE METABOLIC PANEL WITH GFR
ALT: 24 [IU]/L (ref 0–44)
AST: 21 [IU]/L (ref 0–40)
Albumin: 4.3 g/dL (ref 3.8–4.8)
Alkaline Phosphatase: 56 [IU]/L (ref 47–123)
BUN/Creatinine Ratio: 18 (ref 10–24)
BUN: 17 mg/dL (ref 8–27)
Bilirubin Total: 0.6 mg/dL (ref 0.0–1.2)
CO2: 20 mmol/L (ref 20–29)
Calcium: 9.2 mg/dL (ref 8.6–10.2)
Chloride: 107 mmol/L — ABNORMAL HIGH (ref 96–106)
Creatinine, Ser: 0.97 mg/dL (ref 0.76–1.27)
Globulin, Total: 2.5 g/dL (ref 1.5–4.5)
Glucose: 129 mg/dL — ABNORMAL HIGH (ref 70–99)
Potassium: 3.9 mmol/L (ref 3.5–5.2)
Sodium: 142 mmol/L (ref 134–144)
Total Protein: 6.8 g/dL (ref 6.0–8.5)
eGFR: 81 mL/min/{1.73_m2}

## 2024-04-06 ENCOUNTER — Ambulatory Visit: Payer: Self-pay | Admitting: Cardiology

## 2024-04-14 ENCOUNTER — Ambulatory Visit: Admitting: Medical

## 2024-06-18 ENCOUNTER — Inpatient Hospital Stay: Admitting: Hematology & Oncology

## 2024-06-18 ENCOUNTER — Inpatient Hospital Stay

## 2024-09-15 ENCOUNTER — Ambulatory Visit
